# Patient Record
Sex: Female | Born: 1965
Health system: Southern US, Community
[De-identification: ages and names within clinical notes are randomized; demographics above are authoritative.]

## PROBLEM LIST (undated history)

## (undated) DIAGNOSIS — K219 Gastro-esophageal reflux disease without esophagitis: Secondary | ICD-10-CM

## (undated) DIAGNOSIS — I10 Essential (primary) hypertension: Secondary | ICD-10-CM

## (undated) DIAGNOSIS — T7840XA Allergy, unspecified, initial encounter: Secondary | ICD-10-CM

## (undated) DIAGNOSIS — O24419 Gestational diabetes mellitus in pregnancy, unspecified control: Secondary | ICD-10-CM

## (undated) HISTORY — PX: OTHER SURGICAL HISTORY: SHX169

## (undated) HISTORY — DX: Allergy, unspecified, initial encounter: T78.40XA

## (undated) HISTORY — DX: Gestational diabetes mellitus in pregnancy, unspecified control: O24.419

## (undated) HISTORY — DX: Gastro-esophageal reflux disease without esophagitis: K21.9

---
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
## 2004-06-16 ENCOUNTER — Encounter: Payer: Self-pay | Admitting: Family Medicine

## 2004-06-19 ENCOUNTER — Ambulatory Visit: Payer: Self-pay

## 2004-08-01 ENCOUNTER — Ambulatory Visit: Payer: Self-pay

## 2005-02-07 ENCOUNTER — Ambulatory Visit: Payer: Self-pay

## 2005-03-15 ENCOUNTER — Ambulatory Visit: Payer: Self-pay

## 2005-03-29 ENCOUNTER — Ambulatory Visit: Payer: Self-pay

## 2006-07-17 ENCOUNTER — Ambulatory Visit: Payer: Self-pay | Admitting: Internal Medicine

## 2006-07-26 ENCOUNTER — Ambulatory Visit: Payer: Self-pay | Admitting: Family Medicine

## 2007-07-30 LAB — CONVERTED CEMR LAB: Pap Smear: NORMAL

## 2008-08-24 ENCOUNTER — Ambulatory Visit: Payer: Self-pay | Admitting: Family Medicine

## 2008-08-24 DIAGNOSIS — K219 Gastro-esophageal reflux disease without esophagitis: Secondary | ICD-10-CM | POA: Insufficient documentation

## 2008-08-24 DIAGNOSIS — E0590 Thyrotoxicosis, unspecified without thyrotoxic crisis or storm: Secondary | ICD-10-CM | POA: Insufficient documentation

## 2008-08-24 DIAGNOSIS — J309 Allergic rhinitis, unspecified: Secondary | ICD-10-CM | POA: Insufficient documentation

## 2008-08-26 ENCOUNTER — Ambulatory Visit: Payer: Self-pay | Admitting: Family Medicine

## 2008-08-28 LAB — CONVERTED CEMR LAB
ALT: 27 units/L (ref 0–35)
AST: 21 units/L (ref 0–37)
Albumin: 3.7 g/dL (ref 3.5–5.2)
Alkaline Phosphatase: 131 units/L — ABNORMAL HIGH (ref 39–117)
BUN: 17 mg/dL (ref 6–23)
Bilirubin, Direct: 0.1 mg/dL (ref 0.0–0.3)
CO2: 26 meq/L (ref 19–32)
Calcium: 9.2 mg/dL (ref 8.4–10.5)
Chloride: 109 meq/L (ref 96–112)
Cholesterol: 159 mg/dL (ref 0–200)
Creatinine, Ser: 1 mg/dL (ref 0.4–1.2)
GFR calc non Af Amer: 65.14 mL/min (ref >60)
Glucose, Bld: 106 mg/dL — ABNORMAL HIGH (ref 70–99)
HDL: 49.3 mg/dL (ref >39.00)
LDL Cholesterol: 92 mg/dL (ref 0–99)
Potassium: 4.1 meq/L (ref 3.5–5.1)
Sodium: 140 meq/L (ref 135–145)
TSH: 1.05 microintl units/mL (ref 0.35–5.50)
Total Bilirubin: 1 mg/dL (ref 0.3–1.2)
Total CHOL/HDL Ratio: 3
Total Protein: 7.6 g/dL (ref 6.0–8.3)
Triglycerides: 91 mg/dL (ref 0.0–149.0)
VLDL: 18.2 mg/dL (ref 0.0–40.0)

## 2008-09-01 ENCOUNTER — Other Ambulatory Visit: Admission: RE | Admit: 2008-09-01 | Discharge: 2008-09-01 | Payer: Self-pay | Admitting: Family Medicine

## 2008-09-01 ENCOUNTER — Encounter: Payer: Self-pay | Admitting: Family Medicine

## 2008-09-01 ENCOUNTER — Ambulatory Visit: Payer: Self-pay | Admitting: Family Medicine

## 2008-09-07 ENCOUNTER — Encounter (INDEPENDENT_AMBULATORY_CARE_PROVIDER_SITE_OTHER): Payer: Self-pay | Admitting: *Deleted

## 2008-09-15 ENCOUNTER — Encounter: Payer: Self-pay | Admitting: Family Medicine

## 2008-09-15 ENCOUNTER — Ambulatory Visit: Payer: Self-pay | Admitting: Family Medicine

## 2008-09-15 ENCOUNTER — Telehealth: Payer: Self-pay | Admitting: Family Medicine

## 2008-09-15 LAB — HM MAMMOGRAPHY: HM Mammogram: NORMAL

## 2008-09-20 ENCOUNTER — Encounter (INDEPENDENT_AMBULATORY_CARE_PROVIDER_SITE_OTHER): Payer: Self-pay | Admitting: *Deleted

## 2009-01-13 ENCOUNTER — Ambulatory Visit: Payer: Self-pay | Admitting: Family Medicine

## 2009-02-04 ENCOUNTER — Ambulatory Visit: Payer: Self-pay | Admitting: Family Medicine

## 2009-07-13 ENCOUNTER — Ambulatory Visit: Payer: Self-pay | Admitting: Family Medicine

## 2009-07-15 ENCOUNTER — Telehealth: Payer: Self-pay | Admitting: Family Medicine

## 2009-07-19 ENCOUNTER — Telehealth: Payer: Self-pay | Admitting: Family Medicine

## 2009-08-05 ENCOUNTER — Ambulatory Visit: Payer: Self-pay | Admitting: Family Medicine

## 2009-08-26 ENCOUNTER — Ambulatory Visit: Payer: Self-pay | Admitting: Family Medicine

## 2009-08-26 ENCOUNTER — Encounter: Payer: Self-pay | Admitting: Family Medicine

## 2009-09-27 ENCOUNTER — Ambulatory Visit: Payer: Self-pay | Admitting: Family Medicine

## 2009-10-17 ENCOUNTER — Ambulatory Visit: Payer: Self-pay | Admitting: Internal Medicine

## 2010-02-17 ENCOUNTER — Telehealth (INDEPENDENT_AMBULATORY_CARE_PROVIDER_SITE_OTHER): Payer: Self-pay | Admitting: *Deleted

## 2010-02-21 ENCOUNTER — Ambulatory Visit: Payer: Self-pay | Admitting: Family Medicine

## 2010-02-21 LAB — CONVERTED CEMR LAB
ALT: 28 units/L (ref 0–35)
AST: 23 units/L (ref 0–37)
Albumin: 3.7 g/dL (ref 3.5–5.2)
Alkaline Phosphatase: 116 units/L (ref 39–117)
BUN: 13 mg/dL (ref 6–23)
Bilirubin, Direct: 0.2 mg/dL (ref 0.0–0.3)
CO2: 27 meq/L (ref 19–32)
Calcium: 9 mg/dL (ref 8.4–10.5)
Chloride: 105 meq/L (ref 96–112)
Cholesterol: 166 mg/dL (ref 0–200)
Creatinine, Ser: 0.9 mg/dL (ref 0.4–1.2)
GFR calc non Af Amer: 69.45 mL/min (ref >60)
Glucose, Bld: 116 mg/dL — ABNORMAL HIGH (ref 70–99)
HDL: 48.7 mg/dL (ref >39.00)
LDL Cholesterol: 102 mg/dL — ABNORMAL HIGH (ref 0–99)
Potassium: 3.8 meq/L (ref 3.5–5.1)
Sodium: 138 meq/L (ref 135–145)
TSH: 1.06 microintl units/mL (ref 0.35–5.50)
Total Bilirubin: 0.9 mg/dL (ref 0.3–1.2)
Total CHOL/HDL Ratio: 3
Total Protein: 6.9 g/dL (ref 6.0–8.3)
Triglycerides: 78 mg/dL (ref 0.0–149.0)
VLDL: 15.6 mg/dL (ref 0.0–40.0)

## 2010-02-24 ENCOUNTER — Ambulatory Visit: Payer: Self-pay | Admitting: Family Medicine

## 2010-02-24 LAB — HM PAP SMEAR

## 2010-02-24 LAB — CONVERTED CEMR LAB

## 2010-04-12 ENCOUNTER — Telehealth: Payer: Self-pay | Admitting: Family Medicine

## 2010-04-20 ENCOUNTER — Ambulatory Visit: Payer: Self-pay | Admitting: Internal Medicine

## 2010-04-25 ENCOUNTER — Ambulatory Visit
Admission: RE | Admit: 2010-04-25 | Discharge: 2010-04-25 | Payer: Self-pay | Source: Home / Self Care | Attending: Internal Medicine | Admitting: Internal Medicine

## 2010-05-30 NOTE — Assessment & Plan Note (Signed)
Summary: CPX / LFW   Vital Signs:  Patient profile:   45 year old female Height:      63 inches Weight:      202.0 pounds BMI:     35.91 Temp:     98.6 degrees F oral Pulse rate:   80 / minute Pulse rhythm:   regular BP sitting:   130 / 80  (left arm) Cuff size:   large  Vitals Entered By: Benny Lennert CMA Duncan Dull) (February 24, 2010 3:43 PM)  History of Present Illness: Chief complaint cpx  The patient is here for annual wellness exam and preventative care.     Rash on elbow..changed with triamcinolone cream..now pink plaques whit thick white scale. Not itchy.  Family history of psoriasis.   GERD.Marland Kitchenpoorly controlled on omeprazole 40mg  ...1-2 a week.  Feels tightness/irritation in throat.Marland Kitchenafter meals. No abdominal pain.    Prediabetes.[employer]..but hard to go..busy. Interested in nutritionist.   Mild intermittant asthma stable on advair.  Problems Prior to Update: 1)  ? of Psoriasis  (ICD-696.1) 2)  Routine Gynecological Examination  (ICD-V72.31) 3)  Well Woman  (ICD-V70.0) 4)  Other Screening Mammogram  (ICD-V76.12) 5)  Screening For Lipoid Disorders  (ICD-V77.91) 6)  Hyperthyroidism, Subclinical  (ICD-242.90) 7)  Allergic Rhinitis  (ICD-477.9) 8)  Gerd  (ICD-530.81) 9)  Prediabetes  (ICD-790.29) 10)  Asthma, Intermittent, Mild  (ICD-493.90)  Current Medications (verified): 1)  Pantoprazole Sodium 40 Mg Tbec (Pantoprazole Sodium) .[name].. 1 Tab By Mouth Daily 2)  Proair Hfa 108 (90 Base) Mcg/act Aers (Albuterol Sulfate) .... 2 Puffs Every 4 Hours As Needed 3)  Singulair 10 Mg Tabs (Montelukast Sodium) .[name].. 1 Tab By Mouth At Bedtime 4)  Fluticasone Propionate 50 Mcg/act Susp (Fluticasone Propionate) .... 2 Puff in Each Nostril Daily 5)  Zyrtec Allergy 10 Mg Tabs (Cetirizine Hcl) .... Take 1 Tablet By Mouth Once A Day 6)  Multivitamins   Tabs (Multiple Vitamin) .... Take 1 Tablet By Mouth Once A Day 7)  Calcium Carbonate-Vitamin D 600-400 Mg-Unit  Tabs (Calcium  Carbonate-Vitamin D) .... Take 1 Tablet By Mouth Once A Day 8)  Vitamin B-12 500 Mcg  Tabs (Cyanocobalamin) .... Take 1 Tablet By Mouth Once A Day  Allergies (verified): No Known Drug Allergies  Past History:  Past medical, surgical, family and social histories (including risk factors) reviewed, and no changes noted (except as noted below).  Past Medical History: Reviewed history from 08/24/2008 and no changes required. GERD Allergic rhinitis  Past Surgical History: Reviewed history from 08/24/2008 and no changes required. C [phone] and 2000 Appendectomy Tonsillectomy  Family History: Reviewed history from 08/24/2008 and no changes required. father: HTN, arthritis, sleep apnea mother: high chol, osteoporosis brother:sleep apnea no cancer no MI < age 78  Social History: Reviewed history from 08/24/2008 and no changes required. 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  Review of Systems General:  Denies fatigue. CV:  Denies chest pain or discomfort. Resp:  Denies cough and shortness of breath. GI:  Denies abdominal pain, bloody stools, constipation, and diarrhea. GU:  Denies dysuria.  Physical Exam  General:  Well-developed,well-nourished,in no acute distress; alert,appropriate and cooperative throughout examination Eyes:  No corneal or conjunctival inflammation noted. EOMI. Perrla. Funduscopic exam benign, without hemorrhages, exudates or papilledema. Vision grossly normal. Ears:  External ear exam shows no significant  lesions or deformities.  Otoscopic examination reveals clear canals, tympanic membranes are intact bilaterally without bulging, retraction, inflammation or discharge. Hearing is grossly normal bilaterally. Nose:  External nasal examination shows no deformity or  inflammation. Nasal mucosa are pink and moist without lesions or exudates. Mouth:  Oral mucosa and oropharynx without lesions or exudates.  Teeth in good repair. Neck:  no carotid bruit or thyromegaly no cervical or supraclavicular lymphadenopathy  Chest Wall:  No deformities, masses, or tenderness noted. Breasts:  No mass, nodules, thickening, tenderness, bulging, retraction, inflamation, nipple discharge or skin changes noted.   Lungs:  Normal respiratory effort, chest expands symmetrically. Lungs are clear to auscultation, no crackles or wheezes. Heart:  Normal rate and regular rhythm. S1 and S2 normal without gallop, murmur, click, rub or other extra sounds. Abdomen:  Bowel sounds positive,abdomen soft and non-tender without masses, organomegaly or hernias noted. Genitalia:  Pelvic Exam:        External: normal female genitalia without lesions or masses        Vagina: normal without lesions or masses        Cervix: normal without lesions or masses        Adnexa: normal bimanual exam without masses or fullness        Uterus: normal by palpation        Pap smear: performed Pulses:  R and L posterior tibial pulses are full and equal bilaterally  Extremities:  no edema  Skin:  silverish flacky scale of thisck pink plaque on B elbows Psych:  Cognition and judgment appear intact. Alert and cooperative with normal attention span and concentration. No apparent delusions, illusions, hallucinations   Impression & Recommendations:  Problem # 1:  WELL WOMAN (ICD-V70.0) The patient's preventative maintenance and recommended screening tests for an annual wellness exam were reviewed in full today. Brought up to date unless services declined.  Counselled on the importance of diet, exercise, and its role in overall health and mortality. The patient's FH and SH was reviewed, including their home life, tobacco status, and drug and alcohol status.     Problem # 2:  ROUTINE GYNECOLOGICAL EXAMINATION  (ICD-V72.31) PAP pending   Problem # 3:  ? of PSORIASIS (ICD-696.1) Refer to derm for further eval and treat.   Problem # 4:  PREDIABETES (ICD-790.29)  Encouraged exercise, weight loss, healthy eating habits.  Orders: Nutrition Referral (Nutrition)  Labs Reviewed: Creat: 0.9 (02/21/2010)     Problem # 5:  GERD (ICD-530.81) Decrease tomato, citris, peppermint, caffeine., chocolate.  Small meals, lose weight. Start pantoprazole daily. Call for GI referral if reflux not improving in 4-6 weeeks.  Her updated medication list for this problem includes:    Pantoprazole Sodium 40 Mg Tbec (Pantoprazole sodium) .[name]... 1 tab by mouth daily  Problem # 6:  ASTHMA, INTERMITTENT, MILD (ICD-493.90) ON advair Her updated medication list for this problem includes:    Proair Hfa 108 (90 Base) Mcg/act Aers (Albuterol sulfate) .[name]... 2 puffs every 4 hours as needed    Singulair 10 Mg Tabs (Montelukast sodium) .[name]... 1 tab by mouth at bedtime  Complete Medication List: 1)  Pantoprazole Sodium 40 Mg Tbec (Pantoprazole sodium) .[name].. 1 tab by mouth daily 2)  Proair Hfa 108 (90 Base) Mcg/act Aers (Albuterol sulfate) .... 2 puffs every 4 hours as needed 3)  Singulair 10 Mg Tabs (Montelukast sodium) .[name].. 1 tab by mouth at bedtime 4)  Fluticasone Propionate 50 Mcg/act Susp (Fluticasone propionate) .... 2 puff in each nostril  daily 5)  Zyrtec Allergy 10 Mg Tabs (Cetirizine hcl) .... Take 1 tablet by mouth once a day 6)  Multivitamins Tabs (Multiple vitamin) .... Take 1 tablet by mouth once a day 7)  Calcium Carbonate-vitamin D 600-400 Mg-unit Tabs (Calcium carbonate-vitamin d) .... Take 1 tablet by mouth once a day 8)  Vitamin B-12 500 Mcg Tabs (Cyanocobalamin) .... Take 1 tablet by mouth once a day  Other Orders: Admin 1st Vaccine (67619) Flu Vaccine 81yrs + (50932) Pneumococcal Vaccine (67124) Admin of Any Addtl Vaccine (58099) Radiology Referral (Radiology)  Patient Instructions: 1)  Decrease tomato,  citris, peppermint, caffeine., chocolate. 2)   Small meals, lose weight. 3)  Start pantoprazole daily. 4)  Call for GI referral if reflux not improving in 4-6 weeeks.  5)  Referral Appointment Information 6)  Day/Date: 7)  Time: 8)  Place/MD: 9)  Address: 10)  Phone/Fax: 11)  Patient given appointment information. Information/Orders faxed/mailed.  Prescriptions: SINGULAIR 10 MG TABS (MONTELUKAST SODIUM) 1 tab by mouth at bedtime  #90 x 3   Entered and Authorized by:   Kerby Nora MD   Signed by:   Kerby Nora MD on 02/24/2010   Method used:   Electronically to        [facility]* (mail-order)       [facility]       [address]       [place], [place]  [address]       Ph: [phone]       Fax: [phone]   RxID:   [id number] FLUTICASONE PROPIONATE 50 MCG/ACT SUSP (FLUTICASONE PROPIONATE) 2 puff in each nostril daily  #3 x 3   Entered and Authorized by:   Kerby Nora MD   Signed by:   Kerby Nora MD on 02/24/2010   Method used:   Electronically to        [facility]* (mail-order)       [facility]       [address]       [place], [place]  [address]       Ph: [phone]       Fax: [phone]   RxID:   [id number] PANTOPRAZOLE SODIUM 40 MG TBEC (PANTOPRAZOLE SODIUM) 1 tab by mouth daily  #30 x 5   Entered and Authorized by:   Kerby Nora MD   Signed by:   Kerby Nora MD on 02/24/2010   Method used:   [facility]  [address] [phone][facility] (retail)       [address]       [place], [place]  [address]       Ph: [phone]       Fax: [phone]   RxID:   [id number]    Orders Added: 1)  Admin 1st Vaccine [90471] 2)  Flu Vaccine 32yrs + [12751] 3)  Pneumococcal Vaccine [70017] 4)  Admin of Any Addtl Vaccine [90472] 5)  Radiology Referral [Radiology] 6)  Nutrition Referral [Nutrition] 7)  Est. Patient 40-64 years [99396]   Immunizations Administered:  Pneumonia  Vaccine:    Vaccine Type: Pneumovax    Site: right deltoid    Mfr: Merck    Dose: 0.5 ml    Route: [place]    Given by: Benny Lennert CMA (AAMA)    Exp. Date: 08/22/2011    Lot #: [id number]  VIS given: 04/04/09 version given February 24, 2010.   Immunizations Administered:  Pneumonia Vaccine:    Vaccine Type: Pneumovax    Site: right deltoid    Mfr: Merck    Dose: 0.5 ml    Route: [place]    Given by: Benny Lennert CMA (AAMA)    Exp. Date: 08/22/2011    Lot #: [id number]    VIS given: 04/04/09 version given February 24, 2010.  Current Allergies (reviewed today): No known allergies     Flu Vaccine Consent Questions     Do you have a history of severe allergic reactions to this vaccine? no    Any prior history of allergic reactions to egg and/or gelatin? no    Do you have a sensitivity to the preservative Thimersol? no    Do you have a past history of Guillan-Barre Syndrome? no    Do you currently have an acute febrile illness? no    Have you ever had a severe reaction to latex? no    Vaccine information given and explained to patient? yes    Are you currently pregnant? no    Lot Number:[id number]   Exp Date:10/28/2010   Site Given  Left Deltoid IM  .[email or web address]   Past Medical History:    Reviewed history from 08/24/2008 and no changes required:       GERD       Allergic rhinitis  Past Surgical History:    Reviewed history from 08/24/2008 and no changes required:       C [phone] and 2000       Appendectomy       Tonsillectomy   Flu Vaccine Result Date:  02/24/2010 Flu Vaccine Result:  given Flu Vaccine Next Due:  1 yr Last PAP:  NEGATIVE FOR INTRAEPITHELIAL LESIONS OR MALIGNANCY. (09/01/2008 12:00:00 AM) PAP Result Date:  02/24/2010 PAP Result:  DVE , pap q2-3 years PAP Next Due:  1 yr

## 2010-05-30 NOTE — Progress Notes (Signed)
Summary: wants something else for cough  Phone Note Call from Patient Call back at [phone]   Caller: Patient Call For: Dr. Ermalene Searing Summary of Call: Pt was given tesselon for a cough but that is not helping.  The cough is keeping her awake at night and she is asking that [facility] [place]. Initial call taken by: Lowella Petties CMA,  July 19, 2009 1:32 PM  Follow-up for Phone Call        Phone Call Completed, Rx Called In Follow-up by: Benny Lennert CMA Duncan Dull),  July 19, 2009 2:30 PM    New/Updated Medications: GUAIFENESIN-CODEINE 100-10 MG/5ML SYRP (GUAIFENESIN-CODEINE) 1-2 tsp by mouth at bedtime as needed cough Prescriptions: GUAIFENESIN-CODEINE 100-10 MG/5ML SYRP (GUAIFENESIN-CODEINE) 1-2 tsp by mouth at bedtime as needed cough  #8 oz x 0   Entered and Authorized by:   Kerby Nora MD   Signed by:   Kerby Nora MD on 07/19/2009   Method used:   Telephoned to ...       [phone] ([facility]ail[facility]       [address]       [place], [place]  [address]       Ph: [phone]       Fax: [phone]   RxID:   [id number]

## 2010-05-30 NOTE — Progress Notes (Signed)
----   Converted from flag ---- ---- 02/16/2010 11:14 PM, Kerby Nora MD wrote: Dx 242.90 TSH Dx CMET, lipids Dx v77.91  ---- 02/16/2010 9:53 AM, Liane Comber CMA (AAMA) wrote: Lab orders please! Good Morning! This pt is scheduled for cpx labs Tuesday, which labs to draw and dx codes to use? Thanks Tasha ------------------------------

## 2010-05-30 NOTE — Progress Notes (Signed)
Summary: needs order faxed for mammogram  Phone Note From Other Clinic   Caller: [facility] Call For: dr Ermalene Searing Summary of Call: Pt showed up for screening mammogram this morning without an order, they need order faxed to [phone]. Initial call taken by: Lowella Petties,  Sep 15, 2008 11:55 AM  Follow-up for Phone Call        ORDER FAXED TO [facility]  Follow-up by: Carlton Adam,  Sep 15, 2008 3:45 PM

## 2010-05-30 NOTE — Assessment & Plan Note (Signed)
Summary: sore throat/congestion/dlo   Vital Signs:  Patient profile:   45 year old female Height:      63 inches Weight:      196.25 pounds BMI:     34.89 Temp:     98.4 degrees F oral Pulse rate:   84 / minute Pulse rhythm:   regular BP sitting:   148 / 90  (left arm) Cuff size:   large  Vitals Entered By: Delilah Shan CMA Duncan Dull) (July 13, 2009 12:45 PM) CC: Congestion in chest, ST   History of Present Illness: 45 yo here with several days of sore throat, productive cough, nasal congestion. States that she has had pneumonia 5 times in past, has been receving pneumovax reguarly. No shortness of breath, fevers, chest pain. No n/v/d. Not taking anything OTC.  Current Medications (verified): 1)  Omeprazole 40 Mg Cpdr (Omeprazole) .... Take 1 Tablet By Mouth Once A Day 2)  Zyrtec Allergy 10 Mg Tabs (Cetirizine Hcl) .... Take 1 Tablet By Mouth Once A Day 3)  Proair Hfa 108 (90 Base) Mcg/act Aers (Albuterol Sulfate) .... 2 Puffs Every 4 Hours As Needed 4)  Singulair 10 Mg Tabs (Montelukast Sodium) .[name].. 1 Tab By Mouth At Bedtime 5)  Multivitamins   Tabs (Multiple Vitamin) .... Take 1 Tablet By Mouth Once A Day 6)  Calcium Carbonate-Vitamin D 600-400 Mg-Unit  Tabs (Calcium Carbonate-Vitamin D) .... Take 1 Tablet By Mouth Once A Day 7)  Vitamin B-12 500 Mcg  Tabs (Cyanocobalamin) .... Take 1 Tablet By Mouth Once A Day 8)  Azithromycin 250 Mg  Tabs (Azithromycin) .... 2 By  Mouth Today and Then 1 Daily For 4 Days  Allergies (verified): No Known Drug Allergies  Review of Systems      See HPI General:  Denies chills and fever. CV:  Denies chest pain or discomfort. Resp:  Complains of cough and sputum productive; denies shortness of breath and wheezing.  Physical Exam  General:  overweight female in NAD Hypertensive compared to last OV. Afebrile Ears:  External ear exam shows no significant lesions or deformities.  Otoscopic examination reveals clear canals, tympanic membranes  are intact bilaterally without bulging, retraction, inflammation or discharge. Hearing is grossly normal bilaterally. Nose:  mucosal erythema.   Mouth:  MMM Lungs:  Normal respiratory effort, chest expands symmetrically. Lungs are clear to auscultation, no crackles or wheezes. Heart:  Normal rate and regular rhythm. S1 and S2 normal without gallop, murmur, click, rub or other extra sounds. Extremities:  no edema Psych:  withdrawn, poor eye contact, labile affect, and tearful.     Impression & Recommendations:  Problem # 1:  URI (ICD-465.9) Assessment New Early in course but given past medical history, will treat for bacterial bronchitis/sinusitis with Zpack. Close follow up for patient. Red flag signs/symptoms given. Her updated medication list for this problem includes:    Zyrtec Allergy 10 Mg Tabs (Cetirizine hcl) .[name]... Take 1 tablet by mouth once a day  Complete Medication List: 1)  Omeprazole 40 Mg Cpdr (Omeprazole) .... Take 1 tablet by mouth once a day 2)  Zyrtec Allergy 10 Mg Tabs (Cetirizine hcl) .... Take 1 tablet by mouth once a day 3)  Proair Hfa 108 (90 Base) Mcg/act Aers (Albuterol sulfate) .... 2 puffs every 4 hours as needed 4)  Singulair 10 Mg Tabs (Montelukast sodium) .[name].. 1 tab by mouth at bedtime 5)  Multivitamins Tabs (Multiple vitamin) .... Take 1 tablet by mouth once a day 6)  Calcium  Carbonate-vitamin D 600-400 Mg-unit Tabs (Calcium carbonate-vitamin d) .... Take 1 tablet by mouth once a day 7)  Vitamin B-12 500 Mcg Tabs (Cyanocobalamin) .... Take 1 tablet by mouth once a day 8)  Azithromycin 250 Mg Tabs (Azithromycin) .... 2 by  mouth today and then 1 daily for 4 days Prescriptions: AZITHROMYCIN 250 MG  TABS (AZITHROMYCIN) 2 by  mouth today and then 1 daily for 4 days  #6 x 0   Entered and Authorized by:   Ruthe Mannan MD   Signed by:   Ruthe Mannan MD on 07/13/2009   Method used:   [facility]        [facility] [phone][facility] (retail[facility]       [address]        [place], [place]  [address]       Ph: [phone]       Fax: [phone]   RxID:   [id number]   Current Allergies (reviewed today): No known allergies

## 2010-05-30 NOTE — Letter (Signed)
Summary: Results Follow up Letter  [place] [facility]  [address] [place], [place] [address]   Phone: [phone]  Fax: [phone]    09/07/2008 MRN: [id number]    [facility] [address] [place], [place]  [address]    Dear Donna Hensley,  The following are the results of your recent test(s):  Test         Result    Pap Smear:        Normal __X___  Not Normal _____ Comments:   Yearly follow up is recommended. ______________________________________________________ Cholesterol: LDL(Bad cholesterol):         Your goal is less than:         HDL (Good cholesterol):       Your goal is more than: Comments:  ______________________________________________________ Mammogram:        Normal _____  Not Normal _____ Comments:  ___________________________________________________________________ Hemoccult:        Normal _____  Not normal _______ Comments:    _____________________________________________________________________ Other Tests:   GC/Chlamydia is Negative.     We routinely do not discuss normal results over the telephone.  If you desire a copy of the results, or you have any questions about this information we can discuss them at your next office visit.   Sincerely,     Excell Seltzer, M.D.  AEB:lsf

## 2010-05-30 NOTE — Assessment & Plan Note (Signed)
Summary: cough/alc   Vital Signs:  Patient profile:   45 year old female Height:      63 inches Weight:      198.2 pounds BMI:     35.24 O2 Sat:      98 % on Room air Temp:     98.3 degrees F oral Pulse rhythm:   regular Resp:     20 per minute BP sitting:   120 / 78  (left arm) Cuff size:   large  Vitals Entered By: Benny Lennert CMA Duncan Dull) (August 26, 2009 8:50 AM)  O2 Flow:  Room air  History of Present Illness: Chief complaint cough  Peak flow 180 best!  Initial illness in beginning of MArch..took Z-pack no improvement. Treated in early April foir postinflammatory bronchospasm with inhalers..cough continuing.  Some chest heaviness.  Continues to have dry hacking cough. No fever..some fatigue. not sleeping well at night due to cough. Some mild nassal congestion from allergies. No itchy eyes, no sneezing. Taking Zyrtec, singulair and flonase.   No heartburn on omeprazole 40 mg.    Problems Prior to Update: 1)  Cough  (ICD-786.2) 2)  Uri  (ICD-465.9) 3)  Ulnar Neuropathy  (ICD-354.2) 4)  Back Pain, Acute  (ICD-724.5) 5)  Anxiety Depression  (ICD-300.4) 6)  Routine Gynecological Examination  (ICD-V72.31) 7)  Well Woman  (ICD-V70.0) 8)  Other Screening Mammogram  (ICD-V76.12) 9)  Screening For Lipoid Disorders  (ICD-V77.91) 10)  Skin Rash  (ICD-782.1) 11)  Hyperthyroidism, Subclinical  (ICD-242.90) 12)  Allergic Rhinitis  (ICD-477.9) 13)  Gerd  (ICD-530.81) 14)  Diabetes Mellitus, Gestational  (ICD-648.80) 15)  Asthma, Intermittent, Mild  (ICD-493.90)  Current Medications (verified): 1)  Omeprazole 40 Mg Cpdr (Omeprazole) .... Take 1 Tablet By Mouth Once A Day 2)  Zyrtec Allergy 10 Mg Tabs (Cetirizine Hcl) .... Take 1 Tablet By Mouth Once A Day 3)  Proair Hfa 108 (90 Base) Mcg/act Aers (Albuterol Sulfate) .... 2 Puffs Every 4 Hours As Needed 4)  Singulair 10 Mg Tabs (Montelukast Sodium) .[name].. 1 Tab By Mouth At Bedtime 5)  Multivitamins   Tabs (Multiple  Vitamin) .... Take 1 Tablet By Mouth Once A Day 6)  Calcium Carbonate-Vitamin D 600-400 Mg-Unit  Tabs (Calcium Carbonate-Vitamin D) .... Take 1 Tablet By Mouth Once A Day 7)  Vitamin B-12 500 Mcg  Tabs (Cyanocobalamin) .... Take 1 Tablet By Mouth Once A Day 8)  Guaifenesin-Codeine 100-10 Mg/38ml Syrp (Guaifenesin-Codeine) .[name].. 1-2 Tsp By Mouth At Bedtime As Needed Cough 9)  Fluticasone Propionate 50 Mcg/act Susp (Fluticasone Propionate) .... 2 Puff in Each Nostril Daily 10)  Prednisone 10 Mg Tabs (Prednisone) .... 3 Tabs By Mouth Daily X 3 Days, Then 2 Tabs By Mouth Daily X 2 Days Then 1 Tab By Mouth Daily X 2 Days  Allergies (verified): No Known Drug Allergies  Past History:  Past medical, surgical, family and social histories (including risk factors) reviewed, and no changes noted (except as noted below).  Past Medical History: Reviewed history from 08/24/2008 and no changes required. GERD Allergic rhinitis  Past Surgical History: Reviewed history from 08/24/2008 and no changes required. C [phone] and 2000 Appendectomy Tonsillectomy  Family History: Reviewed history from 08/24/2008 and no changes required. father: HTN, arthritis, sleep apnea mother: high chol, osteoporosis brother:sleep apnea no cancer no MI < age 25  Social History: Reviewed history from 08/24/2008 and no changes required. 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  Review of Systems General:  Denies fatigue. CV:  Denies chest pain or discomfort. Resp:  Denies coughing up blood and excessive snoring. GI:  Denies abdominal pain. GU:  Denies dysuria.  Physical Exam  General:  Well-developed,well-nourished,in no acute distress; alert,appropriate and cooperative throughout examination Nose:  External nasal examination  shows no deformity or inflammation. Nasal mucosa are pink and moist without lesions or exudates. Mouth:  Oral mucosa and oropharynx without lesions or exudates.  Teeth in good repair. Neck:  no carotid bruit or thyromegaly no cervical or supraclavicular lymphadenopathy  Lungs:  Normal respiratory effort, chest expands symmetrically. Lungs are clear to auscultation, no crackles or wheezes. Heart:  Normal rate and regular rhythm. S1 and S2 normal without gallop, murmur, click, rub or other extra sounds. Pulses:  R and L posterior tibial pulses are full and equal bilaterally  Extremities:  no edema Skin:  subcutaneous cyst central back drained with palpation, no redness, was mildly tender to patient.    Impression & Recommendations:  Problem # 1:  COUGH (ICD-786.2) CXR clear..symptoms consistent with asthma exacerbaton. Infection resolved, but ongoing regular cough, wheeze improved with albuterol temporarily. Treat with Advair  and albuterol as needed. Return in 1 month for lung function tests.  Orders: CXR- 2view (CXR)  Complete Medication List: 1)  Omeprazole 40 Mg Cpdr (Omeprazole) .... Take 1 tablet by mouth once a day 2)  Zyrtec Allergy 10 Mg Tabs (Cetirizine hcl) .... Take 1 tablet by mouth once a day 3)  Proair Hfa 108 (90 Base) Mcg/act Aers (Albuterol sulfate) .... 2 puffs every 4 hours as needed 4)  Singulair 10 Mg Tabs (Montelukast sodium) .[name].. 1 tab by mouth at bedtime 5)  Multivitamins Tabs (Multiple vitamin) .... Take 1 tablet by mouth once a day 6)  Calcium Carbonate-vitamin D 600-400 Mg-unit Tabs (Calcium carbonate-vitamin d) .... Take 1 tablet by mouth once a day 7)  Vitamin B-12 500 Mcg Tabs (Cyanocobalamin) .... Take 1 tablet by mouth once a day 8)  Guaifenesin-codeine 100-10 Mg/22ml Syrp (Guaifenesin-codeine) .[name].. 1-2 tsp by mouth at bedtime as needed cough 9)  Fluticasone Propionate 50 Mcg/act Susp (Fluticasone propionate) .... 2 puff in each nostril daily 10)  Prednisone  10 Mg Tabs (Prednisone) .... 3 tabs by mouth daily x 3 days, then 2 tabs by mouth daily x 2 days then 1 tab by mouth daily x 2 days 11)  Advair Diskus 250-50 Mcg/dose Aepb (Fluticasone-salmeterol) .[name].. 1 inh two times a day  Patient Instructions: 1)  USe albuterol as need for rescue.  2)  Start advair every day, two times a day  3)   Follow up in 1 month for spirometry. Prescriptions: ADVAIR DISKUS 250-50 MCG/DOSE AEPB (FLUTICASONE-SALMETEROL) 1 inh two times a day  #1 x 2   Entered and Authorized by:   Kerby Nora MD   Signed by:   Kerby Nora MD on 08/26/2009   Method used:   [facility] to        [facility] [phone]* (retail)       [address]       [place], [place]  [address]       Ph: [phone]       Fax: [phone]   RxID:   [id number]   Current Allergies (reviewed today): No known allergies

## 2010-05-30 NOTE — Assessment & Plan Note (Signed)
Summary: NEW PATIENT/BIR   Vital Signs:  Patient profile:   45 year old female Height:      63 inches Weight:      200.50 pounds BMI:     35.65 Temp:     98.1 degrees F oral Pulse rate:   84 / minute Pulse rhythm:   regular BP sitting:   132 / 90  (left arm) Cuff size:   regular  Vitals Entered By: Delilah Shan (August 24, 2008 10:41 AM)  History of Present Illness: Chief Complaint:  New Patient to Establish.    Patient was on Singulair but has been out for a month.  Allergic rhinitis, well controlled on singulair and Zyrtec.  Needs refill of singulair.   Elbows dry B, nonpuritic. Applys lotion.  Ear always feel wet..no ear pain x 1 year. More senitive to it since had swimmer's ear last year. Current sinus pain   Preventive Screening-Counseling & Management     Smoking Status: quit     Does Patient Exercise: yes      Drug Use:  no.    Allergies (verified): No Known Drug Allergies  Past History:  Past Medical History:    GERD    Allergic rhinitis  Past Surgical History:    C [phone] and 2000    Appendectomy    Tonsillectomy  Family History:    father: HTN, arthritis, sleep apnea    mother: high chol, osteoporosis    brother:sleep apnea    no cancer    no MI < age 5  Social History:    Occupation: Corporate treasurer rep    Married    2 children: healthy    Former Smoker, remote social for 2 years    Alcohol use-yes, 1-2 drinks per month    Drug use-no    Regular exercise-yes, walk    Diet: fast food, trying to improve diet.    Occupation:  employed    Smoking Status:  quit    Drug Use:  no    Does Patient Exercise:  yes  Review of Systems General:  Denies fatigue. CV:  Denies chest pain or discomfort. Resp:  Denies shortness of breath. GI:  Denies abdominal pain, bloody stools, constipation, and diarrhea. GU:  Denies abnormal vaginal bleeding and dysuria. Derm:  Complains of rash. Psych:  Denies anxiety and depression. Endo:  Complains  of cold intolerance and weight change; denies heat intolerance.  Physical Exam  General:  overweight female in NAD Eyes:  No corneal or conjunctival inflammation noted. EOMI. Perrla. Funduscopic exam benign, without hemorrhages, exudates or papilledema. Vision grossly normal. Ears:  External ear exam shows no significant lesions or deformities.  Otoscopic examination reveals clear canals, tympanic membranes are intact bilaterally without bulging, retraction, inflammation or discharge. Hearing is grossly normal bilaterally. Nose:  External nasal examination shows no deformity or inflammation. Nasal mucosa are pink and moist without lesions or exudates. Mouth:  Oral mucosa and oropharynx without lesions or exudates.  Teeth in good repair. Neck:  no carotid bruit or thyromegaly no cervical or supraclavicular lymphadenopathy  Lungs:  Normal respiratory effort, chest expands symmetrically. Lungs are clear to auscultation, no crackles or wheezes. Heart:  Normal rate and regular rhythm. S1 and S2 normal without gallop, murmur, click, rub or other extra sounds. Abdomen:  Bowel sounds positive,abdomen soft and non-tender without masses, organomegaly or hernias noted. Msk:  No deformity or scoliosis noted of thoracic or lumbar spine.   Pulses:  R and L posterior  tibial pulses are full and equal bilaterally  Extremities:  No clubbing, cyanosis, edema, or deformity noted with normal full range of motion of all joints.   Neurologic:  No cranial nerve deficits noted. Station and gait are normal. Plantar reflexes are down-going bilaterally. DTRs are symmetrical throughout. Sensory, motor and coordinative functions appear intact. Skin:  B elbow with well circmscribed white grey plaue, dry skin, 2 areas with raised warty like appearance   Impression & Recommendations:  Problem # 1:  SKIN RASH (ICD-782.1) Callus/dermatiits, vs psoriasis vs verrucus. Will treat with topical steroid, if not improvement consider  referral to Derm.  Her updated medication list for this problem includes:    Triamcinolone Acetonide 0.5 % Crea (Triamcinolone acetonide) .[name]... Aaa two times a day x 2 week  Problem # 2:  HYPERTHYROIDISM, SUBCLINICAL (ICD-242.90) Due for reeval.   Problem # 3:  ALLERGIC RHINITIS (ICD-477.9) Reiflled singulair.  Her updated medication list for this problem includes:    Zyrtec Allergy 10 Mg Tabs (Cetirizine hcl) .[name]... Take 1 tablet by mouth once a day    Flonase 50 Mcg/act Susp (Fluticasone propionate) .[name]... 2 sprays each nostril once daily  Problem # 4:  Preventive Health Care (ICD-V70.0)  Due for tetanus, mammogram, pap, breast exam, chol and Dm screen. Schedule.   Complete Medication List: 1)  Omeprazole 20 Mg Tbec (Omeprazole) .... Take 1 tablet by mouth once a day 2)  Zyrtec Allergy 10 Mg Tabs (Cetirizine hcl) .... Take 1 tablet by mouth once a day 3)  Flonase 50 Mcg/act Susp (Fluticasone propionate) .... 2 sprays each nostril once daily 4)  Advair Diskus 250-50 Mcg/dose Misc (Fluticasone-salmeterol) .... As needed 5)  Proair Hfa 108 (90 Base) Mcg/act Aers (Albuterol sulfate) .... 2 puffs every 4 hours as needed 6)  Singulair 10 Mg Tabs (Montelukast sodium) .[name].. 1 tab by mouth at bedtime 7)  Triamcinolone Acetonide 0.5 % Crea (Triamcinolone acetonide) .... Aaa two times a day x 2 week  Patient Instructions: 1)  Fasting lipids, CMET, TSH Dx v77.91, 292.40 2)  schedule CPE/pap.  Prescriptions: TRIAMCINOLONE ACETONIDE 0.5 % CREA (TRIAMCINOLONE ACETONIDE) AAA two times a day x 2 week  #15 gm x 0   Entered and Authorized by:   Kerby Nora MD   Signed by:   Kerby Nora MD on 08/24/2008   Method used:   [facility]        [facility]  [address] [phone]* (retail)       [address]       [place], [place]  [address]       Ph: [phone]       Fax: [phone]   RxID:   [id number] SINGULAIR 10 MG TABS (MONTELUKAST SODIUM) 1 tab by mouth at bedtime  #90 x 3   Entered  and Authorized by:   Kerby Nora MD   Signed by:   Kerby Nora MD on 08/24/2008   Method used:   Print then Give to Patient   RxID:   [id number]       Current Allergies (reviewed today): No known allergies  PAP Result Date:  07/30/2007 PAP Result:  normal PAP Next Due:  1 yr Mammogram Result Date:  03/01/2007 Mammogram Result:  normal Mammogram Next Due:  1 yr

## 2010-05-30 NOTE — Assessment & Plan Note (Signed)
Summary: TROUBLE SLEEPING/CLE   Vital Signs:  Patient profile:   45 year old female Height:      63 inches Weight:      192.0 pounds BMI:     34.13 Temp:     98.1 degrees F oral Pulse rate:   84 / minute Pulse rhythm:   regular BP sitting:   140 / 90  (left arm) Cuff size:   large  Vitals Entered By: Benny Lennert CMA (AAMA) (January 13, 2009 11:45 AM)  History of Present Illness: Chief complaint trouble sleeping   Sudden death of father 6 weeks ago..totally unexpected. feels tense overall. Some trouble falling asleep and staying asleep. 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  Does not feel depressed, but still having trouble dealing with fahter's death. Never taken antidepressant.   B  upper arm numb, left greater than right, x 1 week. Has been doing a lot of moving.  Low to mid back pain. No weakness in arms and legs.    Problems Prior to Update: 1)  Routine Gynecological Examination  (ICD-V72.31) 2)  Well Woman  (ICD-V70.0) 3)  Other Screening Mammogram  (ICD-V76.12) 4)  Screening For Lipoid Disorders  (ICD-V77.91) 5)  Skin Rash  (ICD-782.1) 6)  Hyperthyroidism, Subclinical  (ICD-242.90) 7)  Allergic Rhinitis  (ICD-477.9) 8)  Gerd  (ICD-530.81) 9)  Diabetes Mellitus, Gestational  (ICD-648.80) 10)  Asthma, Intermittent, Mild  (ICD-493.90)  Current Medications (verified): 1)  Omeprazole 40 Mg Cpdr (Omeprazole) .... Take 1 Tablet By Mouth Once A Day 2)  Zyrtec Allergy 10 Mg Tabs (Cetirizine Hcl) .... Take 1 Tablet By Mouth Once A Day 3)  Flonase 50 Mcg/act Susp (Fluticasone Propionate) .... 2 Sprays Each Nostril Once Daily 4)  Advair Diskus 250-50 Mcg/dose Misc (Fluticasone-Salmeterol) .... As Needed 5)  Proair Hfa 108 (90 Base) Mcg/act Aers (Albuterol Sulfate) .... 2 Puffs Every 4 Hours As Needed 6)  Singulair 10 Mg Tabs (Montelukast Sodium) .[name].. 1 Tab By Mouth At Bedtime 7)  Triamcinolone Acetonide 0.5 % Crea (Triamcinolone Acetonide) .... Aaa Two Times A Day X 2  Week 8)  Trazodone Hcl 50 Mg Tabs (Trazodone Hcl) .[name].. 1 Tab By Mouth At Bedtime As Needed Insomnia  Allergies (verified): No Known Drug Allergies  Past History:  Past medical, surgical, family and social histories (including risk factors) reviewed, and no changes noted (except as noted below).  Past Medical History: Reviewed history from 08/24/2008 and no changes required. GERD Allergic rhinitis  Past Surgical History: Reviewed history from 08/24/2008 and no changes required. C [phone] and 2000 Appendectomy Tonsillectomy  Family History: Reviewed history from 08/24/2008 and no changes required. father: HTN, arthritis, sleep apnea mother: high chol, osteoporosis brother:sleep apnea no cancer no MI < age 30  Social History: Reviewed history from 08/24/2008 and no changes required. 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  Review of Systems General:  Complains of fatigue; denies fever. CV:  Denies chest pain or discomfort. Resp:  Denies shortness of breath. GI:  Denies abdominal pain, bloody stools, constipation, and diarrhea. GU:  Denies dysuria.  Physical Exam  General:  overweight female in NAD Mouth:  MMM Neck:  no carotid bruit or thyromegaly no cervical or supraclavicular lymphadenopathy  Lungs:  Normal respiratory effort, chest expands symmetrically. Lungs are clear to auscultation, no crackles or wheezes. Heart:  Normal rate and  regular rhythm. S1 and S2 normal without gallop, murmur, click, rub or other extra sounds. Abdomen:  Bowel sounds positive,abdomen soft and non-tender without masses, organomegaly or hernias noted. Msk:  diffuse lumbar ttp, neg slr, neg faber's, neg spuriling, full ROM of neck and back. Pulses:  R and L posterior tibial pulses are full and equal  bilaterally  Extremities:  No clubbing, cyanosis, edema, or deformity noted with normal full range of motion of all joints.   Neurologic:  No cranial nerve deficits noted. Station and gait are normal. DTRs are symmetrical throughout. Sensory, motor and coordinative functions appear intact. Psych:  withdrawn, poor eye contact, labile affect, and tearful.     Impression & Recommendations:  Problem # 1:  ANXIETY DEPRESSION (ICD-300.4) Insomnia and parasthesia symptoms most likely due to depression/anxiety since recent death of parent.  Will treat with trazodone for sleep. Recommend close follow up...if mood not improving consider addtion of SSRI.   Problem # 2:  BACK PAIN, ACUTE (ICD-724.5) No obvious acute injury.Marland Kitchenintermittant numbness in upper ext.  does not correspond with area oif low back pain . Treat with heat , NSAIDS and stretching. if not improving or continues parasthesia ...will reeval at follow up in 2 weeks.   Complete Medication List: 1)  Omeprazole 40 Mg Cpdr (Omeprazole) .... Take 1 tablet by mouth once a day 2)  Zyrtec Allergy 10 Mg Tabs (Cetirizine hcl) .... Take 1 tablet by mouth once a day 3)  Flonase 50 Mcg/act Susp (Fluticasone propionate) .... 2 sprays each nostril once daily 4)  Advair Diskus 250-50 Mcg/dose Misc (Fluticasone-salmeterol) .... As needed 5)  Proair Hfa 108 (90 Base) Mcg/act Aers (Albuterol sulfate) .... 2 puffs every 4 hours as needed 6)  Singulair 10 Mg Tabs (Montelukast sodium) .[name].. 1 tab by mouth at bedtime 7)  Triamcinolone Acetonide 0.5 % Crea (Triamcinolone acetonide) .... Aaa two times a day x 2 week 8)  Trazodone Hcl 50 Mg Tabs (Trazodone hcl) .[name].. 1 tab by mouth at bedtime as needed insomnia  Patient Instructions: 1)  Please schedule a follow-up appointment in 2 weeks 30 min OV. 2)  Heat , upper body stretches, Ibuprofen 800 mg every 8 hours as needed pain.  Prescriptions: TRAZODONE HCL 50 MG TABS (TRAZODONE HCL) 1 tab by mouth at bedtime as  needed insomnia  #30 x 0   Entered and Authorized by:   Kerby Nora MD   Signed by:   Kerby Nora MD on 01/13/2009   Method used:   [facility] to        [facility] [phone]* (retail)       [address]       [place], [place]  [address]       Ph: [phone]       Fax: [phone]   RxID:   [id number]   Current Allergies (reviewed today): No known allergies

## 2010-05-30 NOTE — Assessment & Plan Note (Signed)
Summary: 30 MIN APPT 2 WEEK FOLLOW UP/[facility]   Vital Signs:  Patient profile:   45 year old female Height:      63 inches Weight:      191.0 pounds BMI:     33.96 Temp:     98.1 degrees F oral Pulse rate:   84 / minute Pulse rhythm:   regular BP sitting:   120 / 70  (left arm) Cuff size:   large  Vitals Entered By: Benny Lennert CMA Duncan Dull) (February 04, 2009 3:47 PM)  History of Present Illness: Chief complaint 30 minute follow up  Depression: improved some...less stress. She feels she is doing okay Insomnia, moderate improvement:  trazodone. Full dose made her have dry mouth...sleeping 6 hours, improved on its own.  No Si, no Hi.  No panic attacks. Tearful when discussing father's death. Seeing family counselor.  Occ numbness when lying in certain positions. Numbness on left posterior arm, ulnar side, radiates from below shoulder.  No specific head movements. No weakness.  no current head, neck pain. No chest pain. no SOB.   Has increased B12.   Problems Prior to Update: 1)  Back Pain, Acute  (ICD-724.5) 2)  Anxiety Depression  (ICD-300.4) 3)  Routine Gynecological Examination  (ICD-V72.31) 4)  Well Woman  (ICD-V70.0) 5)  Other Screening Mammogram  (ICD-V76.12) 6)  Screening For Lipoid Disorders  (ICD-V77.91) 7)  Skin Rash  (ICD-782.1) 8)  Hyperthyroidism, Subclinical  (ICD-242.90) 9)  Allergic Rhinitis  (ICD-477.9) 10)  Gerd  (ICD-530.81) 11)  Diabetes Mellitus, Gestational  (ICD-648.80) 12)  Asthma, Intermittent, Mild  (ICD-493.90)  Current Medications (verified): 1)  Omeprazole 40 Mg Cpdr (Omeprazole) .... Take 1 Tablet By Mouth Once A Day 2)  Zyrtec Allergy 10 Mg Tabs (Cetirizine Hcl) .... Take 1 Tablet By Mouth Once A Day 3)  Flonase 50 Mcg/act Susp (Fluticasone Propionate) .... 2 Sprays Each Nostril Once Daily 4)  Advair Diskus 250-50 Mcg/dose Misc (Fluticasone-Salmeterol) .... As Needed 5)  Proair Hfa 108 (90 Base) Mcg/act Aers (Albuterol Sulfate) .... 2  Puffs Every 4 Hours As Needed 6)  Singulair 10 Mg Tabs (Montelukast Sodium) .[name].. 1 Tab By Mouth At Bedtime  Allergies (verified): No Known Drug Allergies  Past History:  Past medical, surgical, family and social histories (including risk factors) reviewed, and no changes noted (except as noted below).  Past Medical History: Reviewed history from 08/24/2008 and no changes required. GERD Allergic rhinitis  Past Surgical History: Reviewed history from 08/24/2008 and no changes required. C [phone] and 2000 Appendectomy Tonsillectomy  Family History: Reviewed history from 08/24/2008 and no changes required. father: HTN, arthritis, sleep apnea mother: high chol, osteoporosis brother:sleep apnea no cancer no MI < age 63  Social History: Reviewed history from 08/24/2008 and no changes required. 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  Physical Exam  General:  overweight female in NAD Mouth:  MMM Neck:  no carotid bruit or thyromegaly no cervical or supraclavicular lymphadenopathy  Lungs:  Normal respiratory effort, chest expands symmetrically. Lungs are clear to auscultation, no crackles or wheezes. Heart:  Normal rate and regular rhythm. S1 and S2 normal without gallop, murmur, click, rub or other extra sounds. Msk:  no  lumbar ttp, neg slr, neg faber's, neg spuriling, full ROM of neck and back. Triggered ulnar distribution sympotome in left hand when  ulnar nervecompressed at elbow. neg Tinel and phalen's Pulses:  R and L posterior tibial pulses are full and equal bilaterally  Extremities:  no edema Neurologic:  No cranial nerve deficits noted. Station and gait are normal. DTRs are symmetrical throughout. Sensory, motor and coordinative functions appear intact.   Impression &  Recommendations:  Problem # 1:  ANXIETY DEPRESSION (ICD-300.4) Improved with trazodone for sleep and with relaxation, stress reducation. Call if not continuing to improve.   Problem # 2:  BACK PAIN, ACUTE (ICD-724.5) Improved with NSAIds and avoidance of heavy lifting. 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   Problem # 3:  ULNAR NEUROPATHY (ICD-354.2) Info given on rehab exercise and avoidance of triggering postitions. . Minimal symptoms at his point. No suggestion of cervical root compression.  Will also given her higher dose of B12.  Call if not improved.   Complete Medication List: 1)  Omeprazole 40 Mg Cpdr (Omeprazole) .... Take 1 tablet by mouth once a day 2)  Zyrtec Allergy 10 Mg Tabs (Cetirizine hcl) .... Take 1 tablet by mouth once a day 3)  Flonase 50 Mcg/act Susp (Fluticasone propionate) .... 2 sprays each nostril once daily 4)  Advair Diskus 250-50 Mcg/dose Misc (Fluticasone-salmeterol) .... As needed 5)  Proair Hfa 108 (90 Base) Mcg/act Aers (Albuterol sulfate) .... 2 puffs every 4 hours as needed 6)  Singulair 10 Mg Tabs (Montelukast sodium) .[name].. 1 tab by mouth at bedtime  Patient Instructions: 1)  Call if mood not continung to improve. 2)  Call if ulnar neuropathy not improving with rehab exercsies and avpidance of repetitive compression.   Current Allergies (reviewed today): No known allergies

## 2010-05-30 NOTE — Assessment & Plan Note (Signed)
Summary: COUGH/CLE   Vital Signs:  Patient profile:   45 year old female Height:      63 inches Weight:      197.4 pounds BMI:     35.09 Temp:     98.2 degrees F oral Pulse rate:   84 / minute Pulse rhythm:   regular BP sitting:   120 / 70  (left arm) Cuff size:   large  Vitals Entered By: Benny Lennert CMA Duncan Dull) (August 05, 2009 8:42 AM)  History of Present Illness: Chief complaint cough  Seen 1 month ago for bronchitis.Marland Kitchentreated with Z-pack..symptoms resolved except cough never resolved.  Dry cough, no congestion, no fever. No SOB. occ chest heaviness, no wheeze. using albuterol every few days..now out of that and nasal steroid.   Has had history of pneumonia.   Problems Prior to Update: 1)  Uri  (ICD-465.9) 2)  Ulnar Neuropathy  (ICD-354.2) 3)  Back Pain, Acute  (ICD-724.5) 4)  Anxiety Depression  (ICD-300.4) 5)  Routine Gynecological Examination  (ICD-V72.31) 6)  Well Woman  (ICD-V70.0) 7)  Other Screening Mammogram  (ICD-V76.12) 8)  Screening For Lipoid Disorders  (ICD-V77.91) 9)  Skin Rash  (ICD-782.1) 10)  Hyperthyroidism, Subclinical  (ICD-242.90) 11)  Allergic Rhinitis  (ICD-477.9) 12)  Gerd  (ICD-530.81) 13)  Diabetes Mellitus, Gestational  (ICD-648.80) 14)  Asthma, Intermittent, Mild  (ICD-493.90)  Current Medications (verified): 1)  Omeprazole 40 Mg Cpdr (Omeprazole) .... Take 1 Tablet By Mouth Once A Day 2)  Zyrtec Allergy 10 Mg Tabs (Cetirizine Hcl) .... Take 1 Tablet By Mouth Once A Day 3)  Proair Hfa 108 (90 Base) Mcg/act Aers (Albuterol Sulfate) .... 2 Puffs Every 4 Hours As Needed 4)  Singulair 10 Mg Tabs (Montelukast Sodium) .[name].. 1 Tab By Mouth At Bedtime 5)  Multivitamins   Tabs (Multiple Vitamin) .... Take 1 Tablet By Mouth Once A Day 6)  Calcium Carbonate-Vitamin D 600-400 Mg-Unit  Tabs (Calcium Carbonate-Vitamin D) .... Take 1 Tablet By Mouth Once A Day 7)  Vitamin B-12 500 Mcg  Tabs (Cyanocobalamin) .... Take 1 Tablet By Mouth Once A Day 8)   Guaifenesin-Codeine 100-10 Mg/32ml Syrp (Guaifenesin-Codeine) .[name].. 1-2 Tsp By Mouth At Bedtime As Needed Cough  Allergies (verified): No Known Drug Allergies  Past History:  Past medical, surgical, family and social histories (including risk factors) reviewed, and no changes noted (except as noted below).  Past Medical History: Reviewed history from 08/24/2008 and no changes required. GERD Allergic rhinitis  Past Surgical History: Reviewed history from 08/24/2008 and no changes required. C [phone] and 2000 Appendectomy Tonsillectomy  Family History: Reviewed history from 08/24/2008 and no changes required. father: HTN, arthritis, sleep apnea mother: high chol, osteoporosis brother:sleep apnea no cancer no MI < age 44  Social History: Reviewed history from 08/24/2008 and no changes required. 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  Physical Exam  General:  Well-developed,well-nourished,in no acute distress; alert,appropriate and cooperative throughout examination Ears:  External ear exam shows no significant lesions or deformities.  Otoscopic examination reveals clear canals, tympanic membranes are intact bilaterally without bulging, retraction, inflammation or discharge. Hearing is grossly normal bilaterally. Nose:  nasal dischargemucosal pallor.   Mouth:  Oral mucosa and oropharynx without lesions or exudates.  Teeth in good repair. Neck:  no carotid bruit or thyromegaly no cervical or supraclavicular lymphadenopathy  Lungs:  Normal respiratory effort, chest expands symmetrically. Lungs are clear to auscultation, no crackles or wheezes. Heart:  Normal rate and regular rhythm. S1 and S2 normal without gallop, murmur, click, rub or other extra sounds. Skin:  subcutaneous cyst central back  drained with palpation, no redness, was mildly tender to patient.    Impression & Recommendations:  Problem # 1:  COUGH (ICD-786.2) Post inflammatory bronchospasm..no sign of continued infection..countinue albuterol, start steroid course. Treat allergies. Call if fever, SOB, wheeze.  Complete Medication List: 1)  Omeprazole 40 Mg Cpdr (Omeprazole) .... Take 1 tablet by mouth once a day 2)  Zyrtec Allergy 10 Mg Tabs (Cetirizine hcl) .... Take 1 tablet by mouth once a day 3)  Proair Hfa 108 (90 Base) Mcg/act Aers (Albuterol sulfate) .... 2 puffs every 4 hours as needed 4)  Singulair 10 Mg Tabs (Montelukast sodium) .[name].. 1 tab by mouth at bedtime 5)  Multivitamins Tabs (Multiple vitamin) .... Take 1 tablet by mouth once a day 6)  Calcium Carbonate-vitamin D 600-400 Mg-unit Tabs (Calcium carbonate-vitamin d) .... Take 1 tablet by mouth once a day 7)  Vitamin B-12 500 Mcg Tabs (Cyanocobalamin) .... Take 1 tablet by mouth once a day 8)  Guaifenesin-codeine 100-10 Mg/37ml Syrp (Guaifenesin-codeine) .[name].. 1-2 tsp by mouth at bedtime as needed cough 9)  Fluticasone Propionate 50 Mcg/act Susp (Fluticasone propionate) .... 2 puff in each nostril daily 10)  Prednisone 10 Mg Tabs (Prednisone) .... 3 tabs by mouth daily x 3 days, then 2 tabs by mouth daily x 2 days then 1 tab by mouth daily x 2 days  Patient Instructions: 1)  Start prednisone course. 2)  Use proair as needed and restart allergy meds daily.  3)   Call if fever, shortness of breath, productive cough. Prescriptions: PREDNISONE 10 MG TABS (PREDNISONE) 3 tabs by mouth daily x 3 days, then 2 tabs by mouth daily x 2 days then 1 tab by mouth daily x 2 days  #15 x 0   Entered and Authorized by:   Kerby Nora MD   Signed by:   Kerby Nora MD on 08/05/2009   Method used:   Electronically to        [facility]  [address] [phone][facility] (retail)       [address]       [place], [place]  [address]       Ph: [phone]       Fax: [phone]    RxID:   [id number] FLUTICASONE PROPIONATE 50 MCG/ACT SUSP (FLUTICASONE PROPIONATE) 2 puff in each nostril daily  #1 x 3   Entered and Authorized by:   Kerby Nora MD   Signed by:   Kerby Nora MD on 08/05/2009   Method used:   Electronically to        [facility]  [address] [phone]* (retail)       [address]       [place], [place]  [address]       Ph: [phone]       Fax: [phone]   RxID:   [id number] PROAIR HFA 108 (90 BASE) MCG/ACT AERS (ALBUTEROL SULFATE) 2 puffs every 4 hours as needed  #1 x 2   Entered and Authorized by:   Kerby Nora MD   Signed by:   Kerby Nora MD on 08/05/2009   Method used:   [facility] to  [facility] [phone]* (retail)       [address]       [place], [place]  [address]       Ph: [phone]       Fax: [phone]   RxID:   [id number]   Current Allergies (reviewed today): No known allergies

## 2010-05-30 NOTE — Letter (Signed)
Summary: Results Follow up Letter  [facility]  [address] [place], [place] [address]   Phone: [phone]  Fax: [phone]    09/20/2008 MRN: [id number]    [facility] [address] [place], [place]  [address]    Dear Ms. Forner,  The following are the results of your recent test(s):  Test         Result    Pap Smear:        Normal _____  Not Normal _____ Comments: ______________________________________________________ Cholesterol: LDL(Bad cholesterol):         Your goal is less than:         HDL (Good cholesterol):       Your goal is more than: Comments:  ______________________________________________________ Mammogram:        Normal __X___  Not Normal _____ Comments:  Yearly follow up is recommended.   ___________________________________________________________________ Hemoccult:        Normal _____  Not normal _______ Comments:    _____________________________________________________________________ Other Tests:    We routinely do not discuss normal results over the telephone.  If you desire a copy of the results, or you have any questions about this information we can discuss them at your next office visit.   Sincerely,     Excell Seltzer, M.D.  AEB:lsf

## 2010-05-30 NOTE — Progress Notes (Signed)
Summary: wants something for cough  Phone Note Call from Patient Call back at [phone]   Caller: Patient Call For: Kerby Nora MD Summary of Call: Patient was given Z-pak on 07-13-09. She says that now she has developed a terrible cough that she can't find any relief for it. She wants to know if she can get something for the [facility] [address]  Initial call taken by: Melody Comas,  July 15, 2009 10:16 AM    New/Updated Medications: TESSALON PERLES 100 MG  CAPS (BENZONATATE) 1 tab by mouth three times a day as needed cough Prescriptions: TESSALON PERLES 100 MG  CAPS (BENZONATATE) 1 tab by mouth three times a day as needed cough  #30 x 0   Entered and Authorized by:   Ruthe Mannan MD   Signed by:   Ruthe Mannan MD on 07/15/2009   Method used:   Electronically to        [facility] [phone]* (retail)       [address]       [place], [place]  [address]       Ph: [phone]       Fax: [phone]   RxID:   [id number]

## 2010-05-30 NOTE — Assessment & Plan Note (Signed)
Summary: CPX W/PAP/[facility]H   Vital Signs:  Patient profile:   45 year old female LMP:     08/05/2008 Height:      63 inches Weight:      197.38 pounds BMI:     35.09 Temp:     98.4 degrees F oral Pulse rate:   84 / minute Pulse rhythm:   regular BP sitting:   132 / 90  (left arm) Cuff size:   regular  Vitals Entered By: Delilah Shan (Sep 01, 2008 3:09 PM) CC: Asthma Management LMP (date): 08/05/2008  years   days  Enter LMP: 08/05/2008 Last PAP Result normal   History of Present Illness: Chief Complaint:  CPX with Pap.  Need 90 day Rx. Omeprazole to print and give to patient.  The patient is here for annual wellness exam and preventative care.    Doing well overall. Interested in full body skin exam to eval multiple moles...none particularly worrisome.  Asthma History:      Today's PEFR value is 410.      Problems Prior to Update: 1)  Routine Gynecological Examination  (ICD-V72.31) 2)  Well Woman  (ICD-V70.0) 3)  Other Screening Mammogram  (ICD-V76.12) 4)  Screening For Lipoid Disorders  (ICD-V77.91) 5)  Skin Rash  (ICD-782.1) 6)  Hyperthyroidism, Subclinical  (ICD-242.90) 7)  Allergic Rhinitis  (ICD-477.9) 8)  Gerd  (ICD-530.81) 9)  Diabetes Mellitus, Gestational  (ICD-648.80) 10)  Asthma, Intermittent, Mild  (ICD-493.90)  Current Medications (verified): 1)  Omeprazole 40 Mg Cpdr (Omeprazole) .... Take 1 Tablet By Mouth Once A Day 2)  Zyrtec Allergy 10 Mg Tabs (Cetirizine Hcl) .... Take 1 Tablet By Mouth Once A Day 3)  Flonase 50 Mcg/act Susp (Fluticasone Propionate) .... 2 Sprays Each Nostril Once Daily 4)  Advair Diskus 250-50 Mcg/dose Misc (Fluticasone-Salmeterol) .... As Needed 5)  Proair Hfa 108 (90 Base) Mcg/act Aers (Albuterol Sulfate) .... 2 Puffs Every 4 Hours As Needed 6)  Singulair 10 Mg Tabs (Montelukast Sodium) .[name].. 1 Tab By Mouth At Bedtime 7)  Triamcinolone Acetonide 0.5 % Crea (Triamcinolone Acetonide) .... Aaa Two Times A Day X 2  Week  Allergies (verified): No Known Drug Allergies  Past History:  Past medical, surgical, family and social histories (including risk factors) reviewed, and no changes noted (except as noted below).  Past Medical History:    Reviewed history from 08/24/2008 and no changes required:    GERD    Allergic rhinitis  Past Surgical History:    Reviewed history from 08/24/2008 and no changes required:    C [phone] and 2000    Appendectomy    Tonsillectomy  Family History:    Reviewed history from 08/24/2008 and no changes required:       father: HTN, arthritis, sleep apnea       mother: high chol, osteoporosis       brother:sleep apnea       no cancer       no MI < age 18  Social History:    Reviewed history from 08/24/2008 and no changes required:       Occupation: Corporate treasurer rep       Married       2 children: healthy       Former Smoker, remote social for 2 years       Alcohol use-yes, 1-2 drinks per month       Drug use-no       Regular exercise-yes, walk  Diet: fast food, trying to improve diet.  Review of Systems General:  Complains of fatigue; she feels fatigue is appropriate given stress, job and family. CV:  Denies chest pain or discomfort. Resp:  Denies shortness of breath. GI:  Denies abdominal pain, bloody stools, constipation, and diarrhea.  Physical Exam  General:  overweight female in NAD Eyes:  No corneal or conjunctival inflammation noted. EOMI. Perrla. Funduscopic exam benign, without hemorrhages, exudates or papilledema. Vision grossly normal. Ears:  External ear exam shows no significant lesions or deformities.  Otoscopic examination reveals clear canals, tympanic membranes are intact bilaterally without bulging, retraction, inflammation or discharge. Hearing is grossly normal bilaterally. Nose:  External nasal examination shows no deformity or inflammation. Nasal mucosa are pink and moist without lesions or exudates. Mouth:  Oral mucosa  and oropharynx without lesions or exudates.  Teeth in good repair. Neck:  no carotid bruit or thyromegaly no cervical or supraclavicular lymphadenopathy  Chest Wall:  No deformities, masses, or tenderness noted. Breasts:  No mass, nodules, thickening, tenderness, bulging, retraction, inflamation, nipple discharge or skin changes noted.   Lungs:  Normal respiratory effort, chest expands symmetrically. Lungs are clear to auscultation, no crackles or wheezes. Heart:  Normal rate and regular rhythm. S1 and S2 normal without gallop, murmur, click, rub or other extra sounds. Abdomen:  Bowel sounds positive,abdomen soft and non-tender without masses, organomegaly or hernias noted. Genitalia:  Pelvic Exam:        External: normal female genitalia without lesions or masses        Vagina: normal without lesions or masses        Cervix: normal without lesions or masses        Adnexa: normal bimanual exam without masses or fullness        Uterus: normal by palpation        Pap smear: performed Pulses:  R and L posterior tibial pulses are full and equal bilaterally  Extremities:  No clubbing, cyanosis, edema, or deformity noted with normal full range of motion of all joints.   Skin:  B elbow with well circmscribed white grey plaue, dry skin, 2 areas with raised warty like appearance Psych:  Cognition and judgment appear intact. Alert and cooperative with normal attention span and concentration. No apparent delusions, illusions, hallucinations   Impression & Recommendations:  Problem # 1:  WELL WOMAN (ICD-V70.0) Reviewed preventive care protocols, scheduled due services, and updated immunizations. Encouraged exercise, weight loss, healthy eating habits.   Problem # 2:  ROUTINE GYNECOLOGICAL EXAMINATION (ICD-V72.31) Pap pending.   Complete Medication List: 1)  Omeprazole 40 Mg Cpdr (Omeprazole) .... Take 1 tablet by mouth once a day 2)  Zyrtec Allergy 10 Mg Tabs (Cetirizine hcl) .... Take 1 tablet by  mouth once a day 3)  Flonase 50 Mcg/act Susp (Fluticasone propionate) .... 2 sprays each nostril once daily 4)  Advair Diskus 250-50 Mcg/dose Misc (Fluticasone-salmeterol) .... As needed 5)  Proair Hfa 108 (90 Base) Mcg/act Aers (Albuterol sulfate) .... 2 puffs every 4 hours as needed 6)  Singulair 10 Mg Tabs (Montelukast sodium) .[name].. 1 tab by mouth at bedtime 7)  Triamcinolone Acetonide 0.5 % Crea (Triamcinolone acetonide) .... Aaa two times a day x 2 week  Other Orders: Radiology Referral (Radiology)  Patient Instructions: 1)  Referral Appointment Information 2)  Day/Date: 3)  Time: 4)  Place/MD: 5)  Address: 6)  Phone/Fax: 7)  Patient given appointment information. Information/Orders faxed/mailed.  Prescriptions: OMEPRAZOLE 40 MG CPDR (OMEPRAZOLE) Take  1 tablet by mouth once a day  #90 x 3   Entered and Authorized by:   Kerby Nora MD   Signed by:   Kerby Nora MD on 09/01/2008   Method used:   Print then Give to Patient   RxID:   [id number]     Current Allergies (reviewed today): No known allergies     Tetanus/Td Vaccine    Vaccine Type: Tdap   Appended Document: CPX W/PAP/RBH   Tetanus/Td Vaccine    Vaccine Type: Tdap    Site: left deltoid    Mfr: [employer]oSmithK[employer]    Dose: 0.5 ml    Route: IM    Given by: Delilah Shan    Exp. Date: 06/23/2010    Lot #: [id number]    VIS given: 03/18/07 version given Sep 01, 2008.

## 2010-05-30 NOTE — Assessment & Plan Note (Signed)
Summary: 1 m f/u spirometry/dlo   Vital Signs:  Patient profile:   45 year old female Height:      63 inches Weight:      201.4 pounds BMI:     35.81 Temp:     98.5 degrees F oral Pulse rate:   84 / minute Pulse rhythm:   regular BP sitting:   110 / 80  (left arm) Cuff size:   large  Vitals Entered By: Benny Lennert CMA Duncan Dull) (Sep 27, 2009 12:29 PM)  History of Present Illness: Chief complaint 1 monhth follow up spirometry   Chronic cough, wheeze: On Advair x 1 month, 90% improvement with cough. no further wheezing. Not using albuterol.    Spirometry today is normal.   Problems Prior to Update: 1)  Cough  (ICD-786.2) 2)  Uri  (ICD-465.9) 3)  Ulnar Neuropathy  (ICD-354.2) 4)  Back Pain, Acute  (ICD-724.5) 5)  Anxiety Depression  (ICD-300.4) 6)  Routine Gynecological Examination  (ICD-V72.31) 7)  Well Woman  (ICD-V70.0) 8)  Other Screening Mammogram  (ICD-V76.12) 9)  Screening For Lipoid Disorders  (ICD-V77.91) 10)  Skin Rash  (ICD-782.1) 11)  Hyperthyroidism, Subclinical  (ICD-242.90) 12)  Allergic Rhinitis  (ICD-477.9) 13)  Gerd  (ICD-530.81) 14)  Diabetes Mellitus, Gestational  (ICD-648.80) 15)  Asthma, Intermittent, Mild  (ICD-493.90)  Current Medications (verified): 1)  Omeprazole 40 Mg Cpdr (Omeprazole) .... Take 1 Tablet By Mouth Once A Day 2)  Zyrtec Allergy 10 Mg Tabs (Cetirizine Hcl) .... Take 1 Tablet By Mouth Once A Day 3)  Proair Hfa 108 (90 Base) Mcg/act Aers (Albuterol Sulfate) .... 2 Puffs Every 4 Hours As Needed 4)  Singulair 10 Mg Tabs (Montelukast Sodium) .[name].. 1 Tab By Mouth At Bedtime 5)  Multivitamins   Tabs (Multiple Vitamin) .... Take 1 Tablet By Mouth Once A Day 6)  Calcium Carbonate-Vitamin D 600-400 Mg-Unit  Tabs (Calcium Carbonate-Vitamin D) .... Take 1 Tablet By Mouth Once A Day 7)  Vitamin B-12 500 Mcg  Tabs (Cyanocobalamin) .... Take 1 Tablet By Mouth Once A Day 8)  Guaifenesin-Codeine 100-10 Mg/42ml Syrp (Guaifenesin-Codeine) .[name].. 1-2  Tsp By Mouth At Bedtime As Needed Cough 9)  Fluticasone Propionate 50 Mcg/act Susp (Fluticasone Propionate) .... 2 Puff in Each Nostril Daily 10)  Advair Diskus 250-50 Mcg/dose Aepb (Fluticasone-Salmeterol) .[name].. 1 Inh Two Times A Day  Allergies (verified): No Known Drug Allergies  Past History:  Past medical, surgical, family and social histories (including risk factors) reviewed, and no changes noted (except as noted below).  Past Medical History: Reviewed history from 08/24/2008 and no changes required. GERD Allergic rhinitis  Past Surgical History: Reviewed history from 08/24/2008 and no changes required. C [phone] and 2000 Appendectomy Tonsillectomy  Family History: Reviewed history from 08/24/2008 and no changes required. father: HTN, arthritis, sleep apnea mother: high chol, osteoporosis brother:sleep apnea no cancer no MI < age 47  Social History: Reviewed history from 08/24/2008 and no changes required. 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  Review of Systems General:  Denies fatigue and fever. CV:  Denies chest pain or discomfort and swelling of feet. Resp:  Denies coughing up blood and shortness of breath.  Physical Exam  General:  Well-developed,well-nourished,in no acute distress; alert,appropriate and cooperative throughout examination Mouth:  Oral mucosa and oropharynx without lesions or exudates.  Teeth in  good repair. Neck:  no carotid bruit or thyromegaly no cervical or supraclavicular lymphadenopathy  Lungs:  Normal respiratory effort, chest expands symmetrically. Lungs are clear to auscultation, no crackles or wheezes. Heart:  Normal rate and regular rhythm. S1 and S2 normal without gallop, murmur, click, rub or other extra sounds. Pulses:  R and L posterior  tibial pulses are full and equal bilaterally  Extremities:  no edema   Impression & Recommendations:  Problem # 1:  ASTHMA, INTERMITTENT, MILD (ICD-493.90)  Improroved on advair. Continue.  The following medications were removed from the medication list:    Prednisone 10 Mg Tabs (Prednisone) .Marland KitchenMarland KitchenMarland KitchenMarland Kitchen 3 tabs by mouth daily x 3 days, then 2 tabs by mouth daily x 2 days then 1 tab by mouth daily x 2 days Her updated medication list for this problem includes:    Proair Hfa 108 (90 Base) Mcg/act Aers (Albuterol sulfate) .[name]... 2 puffs every 4 hours as needed    Singulair 10 Mg Tabs (Montelukast sodium) .[name]... 1 tab by mouth at bedtime    Advair Diskus 250-50 Mcg/dose Aepb (Fluticasone-salmeterol) .[name]... 1 inh two times a day  Orders: Spirometry w/Graph (94010)  Complete Medication List: 1)  Omeprazole 40 Mg Cpdr (Omeprazole) .... Take 1 tablet by mouth once a day 2)  Zyrtec Allergy 10 Mg Tabs (Cetirizine hcl) .... Take 1 tablet by mouth once a day 3)  Proair Hfa 108 (90 Base) Mcg/act Aers (Albuterol sulfate) .... 2 puffs every 4 hours as needed 4)  Singulair 10 Mg Tabs (Montelukast sodium) .[name].. 1 tab by mouth at bedtime 5)  Multivitamins Tabs (Multiple vitamin) .... Take 1 tablet by mouth once a day 6)  Calcium Carbonate-vitamin D 600-400 Mg-unit Tabs (Calcium carbonate-vitamin d) .... Take 1 tablet by mouth once a day 7)  Vitamin B-12 500 Mcg Tabs (Cyanocobalamin) .... Take 1 tablet by mouth once a day 8)  Guaifenesin-codeine 100-10 Mg/43ml Syrp (Guaifenesin-codeine) .[name].. 1-2 tsp by mouth at bedtime as needed cough 9)  Fluticasone Propionate 50 Mcg/act Susp (Fluticasone propionate) .... 2 puff in each nostril daily 10)  Advair Diskus 250-50 Mcg/dose Aepb (Fluticasone-salmeterol) .[name].. 1 inh two times a day  Current Allergies (reviewed today): No known allergies

## 2010-05-30 NOTE — Letter (Signed)
Summary: Dr.Fozia [facility] Records  Dr[facility]ozia [facility] Records   Imported By: Beau Fanny 08/24/2008 13:35:04  _____________________________________________________________________  External Attachment:    Type:   Image     Comment:   External Document

## 2010-05-30 NOTE — Assessment & Plan Note (Signed)
Summary: EAR PAIN/CLE   Vital Signs:  Patient profile:   45 year old female Weight:      201 pounds Temp:     98.9 degrees F oral BP sitting:   120 / 80  (left arm) Cuff size:   large  Vitals Entered By: Mervin Hack CMA Duncan Dull) (October 17, 2009 12:33 PM) CC: right ear pain   History of Present Illness: Having ear pain tender on outside on right and thinks she has swimmers ear some pain down preauricular area was at beach  last week--swam in ocean  No cough  No rhinorrhea no fever no ear discharge  Tried some OTC drops as preventative then used drops for pain not clearly helpful  Allergies: No Known Drug Allergies  Past History:  Past medical, surgical, family and social histories (including risk factors) reviewed for relevance to current acute and chronic problems.  Past Medical History: Reviewed history from 08/24/2008 and no changes required. GERD Allergic rhinitis  Past Surgical History: Reviewed history from 08/24/2008 and no changes required. C [phone] and 2000 Appendectomy Tonsillectomy  Family History: Reviewed history from 08/24/2008 and no changes required. father: HTN, arthritis, sleep apnea mother: high chol, osteoporosis brother:sleep apnea no cancer no MI < age 70  Social History: Reviewed history from 08/24/2008 and no changes required. 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  Review of Systems       no vomiting  eating okay otherwise feels well  Physical Exam  General:  alert and normal appearance.   Head:  normocephalic and atraumatic.   Ears:  L ear normal.    Tragal tenderness on right mild erythema in canal TM normal Nose:  mild congestion Mouth:  no erythema and no exudates.   Neck:  supple, no masses, and no cervical  lymphadenopathy.     Impression & Recommendations:  Problem # 1:  OTITIS EXTERNA (ICD-380.10) Assessment New  fairly typical presentation  will Rx with cortisporin discussed prevention as well  Her updated medication list for this problem includes:    Neomycin-polymyxin-hc 3.5-10000-1 Susp (Neomycin-polymyxin-hc) .Marland KitchenMarland KitchenMarland KitchenMarland Kitchen 5 drops in right ear four times daily for 5 days  Complete Medication List: 1)  Omeprazole 40 Mg Cpdr (Omeprazole) .... Take 1 tablet by mouth once a day 2)  Proair Hfa 108 (90 Base) Mcg/act Aers (Albuterol sulfate) .... 2 puffs every 4 hours as needed 3)  Singulair 10 Mg Tabs (Montelukast sodium) .[name].. 1 tab by mouth at bedtime 4)  Fluticasone Propionate 50 Mcg/act Susp (Fluticasone propionate) .... 2 puff in each nostril daily 5)  Zyrtec Allergy 10 Mg Tabs (Cetirizine hcl) .... Take 1 tablet by mouth once a day 6)  Multivitamins Tabs (Multiple vitamin) .... Take 1 tablet by mouth once a day 7)  Calcium Carbonate-vitamin D 600-400 Mg-unit Tabs (Calcium carbonate-vitamin d) .... Take 1 tablet by mouth once a day 8)  Vitamin B-12 500 Mcg Tabs (Cyanocobalamin) .... Take 1 tablet by mouth once a day 9)  Neomycin-polymyxin-hc 3.5-10000-1 Susp (Neomycin-polymyxin-hc) .... 5 drops in right ear four times daily for 5 days  Patient Instructions: 1)  Please schedule a follow-up appointment as needed .  Prescriptions: NEOMYCIN-POLYMYXIN-HC 3.5-10000-1 SUSP (NEOMYCIN-POLYMYXIN-HC) 5 drops in right ear four times daily for 5 days  #1 bottle x 3   Entered and Authorized by:   Cindee Salt MD  Signed by:   Cindee Salt MD on 10/17/2009   Method used:   [facility] to        [facility] [phone]* (retail)       [address]       [place], [place]  [address]       Ph: [phone]       Fax: [phone]   RxID:   [id number]   Current Allergies (reviewed today): No known allergies

## 2010-06-01 NOTE — Progress Notes (Signed)
Summary: pantoprazole  Phone Note Refill Request Message from:  Patient on April 12, 2010 8:39 AM  Refills Requested: Medication #1:  PANTOPRAZOLE SODIUM 40 MG TBEC 1 tab by mouth daily [facility]   Initial call taken by: Melody Comas,  April 12, 2010 8:39 AM  Follow-up for Phone Call        Rx faxed to pharmacy Follow-up by: Benny Lennert CMA Duncan Dull),  April 12, 2010 9:36 AM    Prescriptions: PANTOPRAZOLE SODIUM 40 MG TBEC (PANTOPRAZOLE SODIUM) 1 tab by mouth daily  #90 x 3   Entered by:   Benny Lennert CMA (AAMA)   Authorized by:   Kerby Nora MD   Signed by:   Benny Lennert CMA (AAMA) on 04/12/2010   Method used:   Faxed to ...       [facility]             , [place]         Ph: [phone]       Fax: [phone]   RxID:   [id number]

## 2010-06-01 NOTE — Assessment & Plan Note (Signed)
Summary: F/U COUGH/CLE   Vital Signs:  Patient profile:   45 year old female Height:      63 inches Weight:      197.25 pounds BMI:     35.07 O2 Sat:      98 % on Room air Temp:     98.2 degrees F oral Pulse rate:   96 / minute Pulse rhythm:   regular Resp:     20 per minute BP sitting:   116 / 82  (left arm) Cuff size:   large  Vitals Entered By: Lewanda Rife LPN (April 25, 2010 12:31 PM)  O2 Flow:  Room air CC: follow-up visit of cough   History of Present Illness: CC: f/u cough  seen last week, dx asthma flare with possible bronchitis, treated with prednisone (has 2 more days) and albuterol Q4-6 hour which seems to help.  Filled zpack and taking, has one day left.  Cough worsened.  worse at night, delsym and robitussin not cutting it.  Productive of mild sputum.  Feels stuff there but unable to bring up.  Also felt heavy chest last night so wanted to get checked out again.  + hoarseness.  + congestion, ST from coughing.   ~6d illness now  No fevers/chills, nausea/vomiting, abd pain, diarrhea.  No HA.  + husband sick.  + son with strep.  h/o PNA in past, scared for rpt.  Current Medications (verified): 1)  Pantoprazole Sodium 40 Mg Tbec (Pantoprazole Sodium) .[name].. 1 Tab By Mouth Daily 2)  Proair Hfa 108 (90 Base) Mcg/act Aers (Albuterol Sulfate) .... 2 Puffs Every 4 Hours As Needed 3)  Singulair 10 Mg Tabs (Montelukast Sodium) .[name].. 1 Tab By Mouth At Bedtime 4)  Fluticasone Propionate 50 Mcg/act Susp (Fluticasone Propionate) .... 2 Puff in Each Nostril Daily 5)  Zyrtec Allergy 10 Mg Tabs (Cetirizine Hcl) .... Take 1 Tablet By Mouth Once A Day 6)  Multivitamins   Tabs (Multiple Vitamin) .... Take 1 Tablet By Mouth Once A Day 7)  Calcium Carbonate-Vitamin D 600-400 Mg-Unit  Tabs (Calcium Carbonate-Vitamin D) .... Take 1 Tablet By Mouth Once A Day 8)  Vitamin B-12 500 Mcg  Tabs (Cyanocobalamin) .... Take 1 Tablet By Mouth Once A Day 9)  Prednisone 20 Mg Tabs (Prednisone)  .... Take 2 Daily X 7 Days 10)  Triamcinolone Acetonide 0.5 % Crea (Triamcinolone Acetonide) .... Apply To Aa On Thumb Bid X 2 Wks 11)  Zithromax Z-Pak 250 Mg Tabs (Azithromycin) .... Use As Directed 12)  Mucinex Dm 30-600 Mg Xr12h-Tab (Dextromethorphan-Guaifenesin) .... Otc As Directed.  Allergies (verified): No Known Drug Allergies  Past History:  Past Medical History: GERD Allergic rhinitis asthma  Social History: Reviewed history from 08/24/2008 and no changes required. 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  Review of Systems       per HPI  Physical Exam  General:  Well-developed,well-nourished,in no acute distress; alert,appropriate and cooperative throughout examination Head:  normocephalic and atraumatic.  no sinus tenderness Eyes:  No corneal or conjunctival inflammation noted. EOMI. Perrla. Ears:  Tms clear bilaterally Nose:  nares clear bilaterally, pink and moist Mouth:  no erythema/edema.  MMM Neck:  no carotid bruit or thyromegaly no cervical or supraclavicular lymphadenopathy  Lungs:  normal WOB, somewhat distant breath sounds, no wheezing or rales.  overall clear Heart:  Normal rate and regular rhythm. S1 and S2 normal without gallop, murmur, click, rub or other extra sounds. Abdomen:  Bowel sounds positive,abdomen soft and non-tender without masses, organomegaly or hernias noted. Pulses:  2+ rad pulses Extremities:  no pedal edema, brisk cap refill Skin:  improved lesion thumb   Impression & Recommendations:  Problem # 1:  ACUTE BRONCHITIS (ICD-466.0) asthmatic bronchitis.  no indication of pneumonia, lungs clear, vss stable, no fever.  no significant productive cough.  monitor for now, no CXR.  advised of red flags to return/update for consideratino of CXR.  continue  course for now.  prolonged steroid course, added taper.  added cough syrup for nighttime use.  Her updated medication list for this problem includes:    Proair Hfa 108 (90 Base) Mcg/act Aers (Albuterol sulfate) .[name]... 2 puffs every 4 hours as needed    Singulair 10 Mg Tabs (Montelukast sodium) .[name]... 1 tab by mouth at bedtime    Zithromax Z-pak 250 Mg Tabs (Azithromycin) ..... Use as directed    Cheratussin Ac 100-10 Mg/75ml Syrp (Guaifenesin-codeine) .[name]... Take one teaspoon q6 hours as needed cough, sedation precautions  Problem # 2:  SKIN RASH (ICD-782.1) much imporved on oral steroids.  advised to use triamcinolone x 1-2 wks after oral steroids to ensure resolution. likely posion ivy. Her updated medication list for this problem includes:    Triamcinolone Acetonide 0.5 % Crea (Triamcinolone acetonide) .[name]... Apply to aa on thumb bid x 2 wks  Complete Medication List: 1)  Pantoprazole Sodium 40 Mg Tbec (Pantoprazole sodium) .[name].. 1 tab by mouth daily 2)  Proair Hfa 108 (90 Base) Mcg/act Aers (Albuterol sulfate) .... 2 puffs every 4 hours as needed 3)  Singulair 10 Mg Tabs (Montelukast sodium) .[name].. 1 tab by mouth at bedtime 4)  Fluticasone Propionate 50 Mcg/act Susp (Fluticasone propionate) .... 2 puff in each nostril daily 5)  Zyrtec Allergy 10 Mg Tabs (Cetirizine hcl) .... Take 1 tablet by mouth once a day 6)  Multivitamins Tabs (Multiple vitamin) .... Take 1 tablet by mouth once a day 7)  Calcium Carbonate-vitamin D 600-400 Mg-unit Tabs (Calcium carbonate-vitamin d) .... Take 1 tablet by mouth once a day 8)  Vitamin B-12 500 Mcg Tabs (Cyanocobalamin) .... Take 1 tablet by mouth once a day 9)  Prednisone 10 Mg Tabs (Prednisone) .... 3 for 2 days then 2 for 2 days then 1 for 2 days 10)  Triamcinolone Acetonide 0.5 % Crea (Triamcinolone acetonide) .... Apply to aa on thumb bid x 2 wks 11)  Zithromax Z-pak 250 Mg Tabs (Azithromycin) .... Use as directed 12)  Cheratussin Ac 100-10 Mg/57ml Syrp  (Guaifenesin-codeine) .... Take one teaspoon q6 hours as needed cough, sedation precautions  Patient Instructions: 1)  Still looks like bronchitis/asthma flare. 2)  Things to watch out for - return of fever, worsening cough or shortness of breath.  If this happens, call us for CXR. 3)  Hope you start feeling better. 4)  Cough syrup at night. Prescriptions: PREDNISONE 10 MG TABS (PREDNISONE) 3 for 2 days then 2 for 2 days then 1 for 2 days  #12 x 0   Entered and Authorized by:   Eustaquio Boyden  MD   Signed by:   Eustaquio Boyden  MD on 04/25/2010   Method used:   [facility]        [facility]  [address] [phone]* (retail)       [address]       [place]  [place], [place]  [address]       Ph: [phone]       Fax: [phone]   RxID:   [id number] CHERATUSSIN AC 100-10 MG/5ML SYRP (GUAIFENESIN-CODEINE) take one teaspoon q6 hours as needed cough, sedation precautions  #100cc x 0   Entered and Authorized by:   Eustaquio Boyden  MD   Signed by:   Eustaquio Boyden  MD on 04/25/2010   Method used:   Print then Give to Patient   RxID:   [id number]    Orders Added: 1)  Est. Patient Level III [59563]    Current Allergies (reviewed today): No known allergies

## 2010-06-01 NOTE — Assessment & Plan Note (Signed)
Summary: sore throat, congestion/alc   Vital Signs:  Patient profile:   45 year old female Weight:      198 pounds O2 Sat:      97 % on Room air Temp:     98.4 degrees F oral Pulse rate:   88 / minute Pulse rhythm:   regular BP sitting:   118 / 80  (left arm) Cuff size:   large  Vitals Entered By: Selena Batten Dance CMA Duncan Dull) (April 20, 2010 3:17 PM)  O2 Flow:  Room air CC: Congestion, rash on right hand   History of Present Illness: CC: congestion, prone to PNA  feeling tight breathing since last night.  Woke up this morning with congestion, ST, back soreness.  Hasn't tried anything yet.  No RN, itchy eyes.  No fevers/chills, abd pain, n/v/d, HA, myalgias, arthralgias.  No smokers at home.  + h/o allergies and asthma per patient.  R thumb lesion thinks from poison ivy, has tried OTC meds.  since last visit 02/24/2010.  goes away, then comes back.  months.  Current Medications (verified): 1)  Pantoprazole Sodium 40 Mg Tbec (Pantoprazole Sodium) .[name].. 1 Tab By Mouth Daily 2)  Proair Hfa 108 (90 Base) Mcg/act Aers (Albuterol Sulfate) .... 2 Puffs Every 4 Hours As Needed 3)  Singulair 10 Mg Tabs (Montelukast Sodium) .[name].. 1 Tab By Mouth At Bedtime 4)  Fluticasone Propionate 50 Mcg/act Susp (Fluticasone Propionate) .... 2 Puff in Each Nostril Daily 5)  Zyrtec Allergy 10 Mg Tabs (Cetirizine Hcl) .... Take 1 Tablet By Mouth Once A Day 6)  Multivitamins   Tabs (Multiple Vitamin) .... Take 1 Tablet By Mouth Once A Day 7)  Calcium Carbonate-Vitamin D 600-400 Mg-Unit  Tabs (Calcium Carbonate-Vitamin D) .... Take 1 Tablet By Mouth Once A Day 8)  Vitamin B-12 500 Mcg  Tabs (Cyanocobalamin) .... Take 1 Tablet By Mouth Once A Day  Allergies (verified): No Known Drug Allergies  Past History:  Social History: Last updated: 08/24/2008 Occupation: Corporate treasurer rep Married 2 children: healthy Former Smoker, remote social for 2 years Alcohol use-yes, 1-2 drinks per month Drug  use-no Regular exercise-yes, walk Diet: fast food, trying to improve diet.  Past Medical History: GERD Allergic rhinitis asthma per patinet  Review of Systems       per HPI  Physical Exam  General:  Well-developed,well-nourished,in no acute distress; alert,appropriate and cooperative throughout examination Head:  normocephalic and atraumatic.   Eyes:  No corneal or conjunctival inflammation noted. EOMI. Perrla. Ears:  Tms clear bilaterally Nose:  nares clear bilaterally, pink and moist Mouth:  no erythema/edema.  MMM Neck:  no carotid bruit or thyromegaly no cervical or supraclavicular lymphadenopathy  Lungs:  tight air movement, increased exp phase.  no wheezing. Heart:  Normal rate and regular rhythm. S1 and S2 normal without gallop, murmur, click, rub or other extra sounds. Pulses:  2+ rad pulses Extremities:  no pedal edema, brisk cap refill Skin:  lesion R inside thumb cluster of papules, slight erythema around, + pruritic.  + excoriation   Impression & Recommendations:  Problem # 1:  ASTHMA, WITH ACUTE EXACERBATION (ICD-493.92)  mild flare.  treat with steroids.  zpack to hold on to given weekend in case develops fever or wrosening breahting.  schedule albuterol for next 2 days.  update if not better.  Her updated medication list for this problem includes:    Proair Hfa 108 (90 Base) Mcg/act Aers (Albuterol sulfate) .[name]... 2 puffs every 4 hours as  needed    Singulair 10 Mg Tabs (Montelukast sodium) .[name]... 1 tab by mouth at bedtime    Prednisone 20 Mg Tabs (Prednisone) .[name]... Take 2 daily x 7 days  Pulmonary Functions Reviewed: PEFR: 410 (09/01/2008)   O2 sat: 97 (04/20/2010)  Complete Medication List: 1)  Pantoprazole Sodium 40 Mg Tbec (Pantoprazole sodium) .[name].. 1 tab by mouth daily 2)  Proair Hfa 108 (90 Base) Mcg/act Aers (Albuterol sulfate) .... 2 puffs every 4 hours as needed 3)  Singulair 10 Mg Tabs (Montelukast sodium) .[name].. 1 tab by mouth at bedtime 4)   Fluticasone Propionate 50 Mcg/act Susp (Fluticasone propionate) .... 2 puff in each nostril daily 5)  Zyrtec Allergy 10 Mg Tabs (Cetirizine hcl) .... Take 1 tablet by mouth once a day 6)  Multivitamins Tabs (Multiple vitamin) .... Take 1 tablet by mouth once a day 7)  Calcium Carbonate-vitamin D 600-400 Mg-unit Tabs (Calcium carbonate-vitamin d) .... Take 1 tablet by mouth once a day 8)  Vitamin B-12 500 Mcg Tabs (Cyanocobalamin) .... Take 1 tablet by mouth once a day 9)  Prednisone 20 Mg Tabs (Prednisone) .... Take 2 daily x 7 days 10)  Triamcinolone Acetonide 0.5 % Crea (Triamcinolone acetonide) .... Apply to aa on thumb bid x 2 wks 11)  Zithromax Z-pak 250 Mg Tabs (Azithromycin) .... Use as directed  Patient Instructions: 1)  Sounds like viral infection, may be a bit of asthma flare.   2)  treat with course of steroids. 3)  simple mucinex with plenty of fluid. 4)  Antibiotic to hold on to in case fevers or worsening over weekend. 5)  return if fevers >101.5, worsening breathing or other concerns. 6)  For thumb, try steroid cream. Prescriptions: ZITHROMAX Z-PAK 250 MG TABS (AZITHROMYCIN) use as directed  #1 x 0   Entered and Authorized by:   Eustaquio Boyden  MD   Signed by:   Eustaquio Boyden  MD on 04/20/2010   Method used:   Print then Give to Patient   RxID:   [id number] TRIAMCINOLONE ACETONIDE 0.5 % CREA (TRIAMCINOLONE ACETONIDE) apply to AA on thumb bid x 2 wks  #1 x 0   Entered and Authorized by:   Eustaquio Boyden  MD   Signed by:   Eustaquio Boyden  MD on 04/20/2010   Method used:   [facility]  [address] [phone][facility] (retail[facility]       [address]       [place], [place]  [address]       Ph: [phone]       Fax: [phone]   RxID:   [id number] PREDNISONE 20 MG TABS (PREDNISONE) take 2 daily x 7 days  #14 x 0   Entered and Authorized by:   Eustaquio Boyden  MD   Signed by:   Eustaquio Boyden  MD on 04/20/2010   Method used:    [facility]        [facility] [phone]* (retail)       [address]       [place], [place]  [address]       Ph: [phone]       Fax: [phone]   RxID:   [id number]    Orders Added: 1)  Est. Patient Level III [20254]    Current Allergies (reviewed today): No known allergies  Appended Document: sore throat, congestion/alc    Clinical Lists Changes  Problems: Added new problem of SKIN RASH (ICD-782.1) Assessed SKIN RASH as comment only - late entry - possible nummular eczema, but very localized.  ? contact dermatitis.  treat with steroid cream if oral steroids for asthma flare doesn't control. Her updated medication list for this problem includes:    Triamcinolone Acetonide 0.5 % Crea (Triamcinolone acetonide) .[name]... Apply to aa on thumb bid x 2 wks          Impression & Recommendations:  Problem # 1:  SKIN RASH (ICD-782.1) late entry - possible nummular eczema, but very localized.  ? contact dermatitis.  treat with steroid cream if oral steroids for asthma flare doesn't control. Her updated medication list for this problem includes:    Triamcinolone Acetonide 0.5 % Crea (Triamcinolone acetonide) .[name]... Apply to aa on thumb bid x 2 wks  Complete Medication List: 1)  Pantoprazole Sodium 40 Mg Tbec (Pantoprazole sodium) .[name].. 1 tab by mouth daily 2)  Proair Hfa 108 (90 Base) Mcg/act Aers (Albuterol sulfate) .... 2 puffs every 4 hours as needed 3)  Singulair 10 Mg Tabs (Montelukast sodium) .[name].. 1 tab by mouth at bedtime 4)  Fluticasone Propionate 50 Mcg/act Susp (Fluticasone propionate) .... 2 puff in each nostril daily 5)  Zyrtec Allergy 10 Mg Tabs (Cetirizine hcl) .... Take 1 tablet by mouth once a day 6)  Multivitamins Tabs (Multiple vitamin) .... Take 1 tablet by mouth once a day 7)  Calcium Carbonate-vitamin D 600-400 Mg-unit Tabs (Calcium carbonate-vitamin d) .... Take 1 tablet by mouth once a day 8)  Vitamin B-12 500 Mcg Tabs  (Cyanocobalamin) .... Take 1 tablet by mouth once a day 9)  Prednisone 20 Mg Tabs (Prednisone) .... Take 2 daily x 7 days 10)  Triamcinolone Acetonide 0.5 % Crea (Triamcinolone acetonide) .... Apply to aa on thumb bid x 2 wks 11)  Zithromax Z-pak 250 Mg Tabs (Azithromycin) .... Use as directed

## 2010-06-20 ENCOUNTER — Ambulatory Visit (INDEPENDENT_AMBULATORY_CARE_PROVIDER_SITE_OTHER): Payer: BC Managed Care – PPO | Admitting: Family Medicine

## 2010-06-20 ENCOUNTER — Encounter: Payer: Self-pay | Admitting: Family Medicine

## 2010-06-20 DIAGNOSIS — J45901 Unspecified asthma with (acute) exacerbation: Secondary | ICD-10-CM

## 2010-06-20 DIAGNOSIS — J069 Acute upper respiratory infection, unspecified: Secondary | ICD-10-CM

## 2010-06-20 DIAGNOSIS — J4520 Mild intermittent asthma, uncomplicated: Secondary | ICD-10-CM | POA: Insufficient documentation

## 2010-06-27 NOTE — Assessment & Plan Note (Signed)
Summary: COLD (?) / LFW   Vital Signs:  Patient profile:   45 year old female Height:      63 inches Weight:      198.75 pounds BMI:     35.33 Temp:     98.6 degrees F oral Pulse rate:   96 / minute Pulse rhythm:   regular BP sitting:   120 / 86  (left arm) Cuff size:   large  Vitals Entered By: Benny Lennert CMA Duncan Dull) (June 20, 2010 8:52 AM)  History of Present Illness: Chief complaint ? cold  Acute Visit History:      The patient complains of cough, earache, nasal discharge, and sore throat.  These symptoms began 5 days ago.  She denies chest pain, fever, headache, and sinus problems.  Other comments include: Using inhaler every 6 hours for wheeze. Advil for pain.  Usually asthma only triggered by illness .. usually well contolled asthma.        The patient notes wheezing.  The character of the cough is described as nonproductive.  There is no history of shortness of breath associated with her cough.        Problems Prior to Update: 1)  ? of Psoriasis  (ICD-696.1) 2)  Routine Gynecological Examination  (ICD-V72.31) 3)  Well Woman  (ICD-V70.0) 4)  Other Screening Mammogram  (ICD-V76.12) 5)  Screening For Lipoid Disorders  (ICD-V77.91) 6)  Hyperthyroidism, Subclinical  (ICD-242.90) 7)  Allergic Rhinitis  (ICD-477.9) 8)  Gerd  (ICD-530.81) 9)  Prediabetes  (ICD-790.29) 10)  Asthma, Intermittent, Mild  (ICD-493.90)  Current Medications (verified): 1)  Pantoprazole Sodium 40 Mg Tbec (Pantoprazole Sodium) .[name].. 1 Tab By Mouth Daily 2)  Proair Hfa 108 (90 Base) Mcg/act Aers (Albuterol Sulfate) .... 2 Puffs Every 4 Hours As Needed 3)  Singulair 10 Mg Tabs (Montelukast Sodium) .[name].. 1 Tab By Mouth At Bedtime 4)  Fluticasone Propionate 50 Mcg/act Susp (Fluticasone Propionate) .... 2 Puff in Each Nostril Daily 5)  Zyrtec Allergy 10 Mg Tabs (Cetirizine Hcl) .... Take 1 Tablet By Mouth Once A Day 6)  Multivitamins   Tabs (Multiple Vitamin) .... Take 1 Tablet By Mouth Once A  Day 7)  Calcium Carbonate-Vitamin D 600-400 Mg-Unit  Tabs (Calcium Carbonate-Vitamin D) .... Take 1 Tablet By Mouth Once A Day 8)  Vitamin B-12 500 Mcg  Tabs (Cyanocobalamin) .... Take 1 Tablet By Mouth Once A Day  Allergies (verified): No Known Drug Allergies  Past History:  Past medical, surgical, family and social histories (including risk factors) reviewed, and no changes noted (except as noted below).  Past Medical History: Reviewed history from 04/25/2010 and no changes required. GERD Allergic rhinitis asthma  Past Surgical History: Reviewed history from 08/24/2008 and no changes required. C [phone] and 2000 Appendectomy Tonsillectomy  Family History: Reviewed history from 08/24/2008 and no changes required. father: HTN, arthritis, sleep apnea mother: high chol, osteoporosis brother:sleep apnea no cancer no MI < age 24  Social History: Reviewed history from 08/24/2008 and no changes required. 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  Review of Systems      See HPI  Physical Exam  General:  overweight appearing female in NAD  Head:  no maxillary sinus ttp. Ears:  clear fluid B TMs Nose:  clear discharge, no mucosal pallor.   Mouth:  MMM, no exudate,  but 2 oropharyngel ulcers.  Neck:  no carotid bruit or thyromegaly no cervical or supraclavicular lymphadenopathy  Lungs:  Normal respiratory effort, chest expands symmetrically. Lungs are clear to auscultation, no crackles or wheezes. Heart:  Normal rate and regular rhythm. S1 and S2 normal without gallop, murmur, click, rub or other extra sounds. Pulses:  R and L posterior tibial pulses are full and equal bilaterally  Extremities:  no edema   Impression & Recommendations:  Problem # 1:  VIRAL URI (ICD-465.9)  The following  medications were removed from the medication list:    Cheratussin Ac 100-10 Mg/59ml Syrp (Guaifenesin-codeine) .[name]... Take one teaspoon q6 hours as needed cough, sedation precautions Her updated medication list for this problem includes:    Zyrtec Allergy 10 Mg Tabs (Cetirizine hcl) .[name]... Take 1 tablet by mouth once a day  Instructed on symptomatic treatment. Call if symptoms persist or worsen.   Problem # 2:  ASTHMA, WITH ACUTE EXACERBATION (ICD-493.92) Viral trigger. Start prednisone taper. Use albuterol as needed wheeze.  The following medications were removed from the medication list:    Prednisone 10 Mg Tabs (Prednisone) .Marland KitchenMarland KitchenMarland KitchenMarland Kitchen 3 for 2 days then 2 for 2 days then 1 for 2 days Her updated medication list for this problem includes:    Proair Hfa 108 (90 Base) Mcg/act Aers (Albuterol sulfate) .[name]... 2 puffs every 4 hours as needed    Singulair 10 Mg Tabs (Montelukast sodium) .[name]... 1 tab by mouth at bedtime    Prednisone 20 Mg Tabs (Prednisone) .Marland KitchenMarland KitchenMarland KitchenMarland Kitchen 3 tabs by mouth daily x 3 days, then 2 tabs by mouth daily x 2 days then 1 tab by mouth daily x 2 days  Complete Medication List: 1)  Pantoprazole Sodium 40 Mg Tbec (Pantoprazole sodium) .[name].. 1 tab by mouth daily 2)  Proair Hfa 108 (90 Base) Mcg/act Aers (Albuterol sulfate) .... 2 puffs every 4 hours as needed 3)  Singulair 10 Mg Tabs (Montelukast sodium) .[name].. 1 tab by mouth at bedtime 4)  Fluticasone Propionate 50 Mcg/act Susp (Fluticasone propionate) .... 2 puff in each nostril daily 5)  Zyrtec Allergy 10 Mg Tabs (Cetirizine hcl) .... Take 1 tablet by mouth once a day 6)  Multivitamins Tabs (Multiple vitamin) .... Take 1 tablet by mouth once a day 7)  Calcium Carbonate-vitamin D 600-400 Mg-unit Tabs (Calcium carbonate-vitamin d) .... Take 1 tablet by mouth once a day 8)  Vitamin B-12 500 Mcg Tabs (Cyanocobalamin) .... Take 1 tablet by mouth once a day 9)  Prednisone 20 Mg Tabs (Prednisone) .... 3 tabs by mouth daily x 3 days, then 2 tabs by  mouth daily x 2 days then 1 tab by mouth daily x 2 days   Patient Instructions: 1)  Viral trigger to asthma 2)  Use advil for sore throat. 3)  Use mucinex DM to break up mucus and for cough. 4)  Start prednisone taper. 5)  Use albuterol as needed wheeze. 6)   Call if not improving as expected... call if wheezing not improving. 7)   If severe SOb.[name] go to ER.  Prescriptions: PREDNISONE 20 MG TABS (PREDNISONE) 3 tabs by mouth daily x 3 days, then 2 tabs by mouth daily x 2 days then 1 tab by mouth daily x 2 days  #15 x 0   Entered and Authorized by:   Kerby Nora MD   Signed by:   Kerby Nora MD on 06/20/2010   Method used:   [facility] to  [facility] [phone]* (retail)       [address]       [place], [place]  [address]       Ph: [phone]       Fax: [phone]   RxID:   [id number]    Orders Added: 1)  Est. Patient Level III [33295]    Current Allergies (reviewed today): No known allergies

## 2010-06-30 ENCOUNTER — Encounter: Payer: Self-pay | Admitting: Family Medicine

## 2010-07-18 NOTE — Letter (Signed)
Summary: [facility]   Imported By: Kassie Mends 07/10/2010 10:11:34  _____________________________________________________________________  External Attachment:    Type:   Image     Comment:   External Document

## 2010-07-28 ENCOUNTER — Ambulatory Visit: Payer: Self-pay | Admitting: Family Medicine

## 2010-08-09 ENCOUNTER — Encounter: Payer: Self-pay | Admitting: Family Medicine

## 2010-10-12 ENCOUNTER — Telehealth: Payer: Self-pay | Admitting: *Deleted

## 2010-10-12 NOTE — Telephone Encounter (Signed)
Patient says that she has been swimming a lot lately and now has swimmers ear. She says that she problems with this every summer. She is asking if she can get ear drops called in. [facility].

## 2010-10-13 NOTE — Telephone Encounter (Signed)
Patient advised and she will go to urgent care today

## 2010-10-13 NOTE — Telephone Encounter (Signed)
Needs appt to be seen here or in Sat clinic

## 2010-12-12 ENCOUNTER — Other Ambulatory Visit: Payer: Self-pay | Admitting: Family Medicine

## 2011-05-01 HISTORY — PX: CHOLECYSTECTOMY: SHX55

## 2011-07-18 ENCOUNTER — Ambulatory Visit (INDEPENDENT_AMBULATORY_CARE_PROVIDER_SITE_OTHER): Payer: BC Managed Care – PPO | Admitting: Family Medicine

## 2011-07-18 ENCOUNTER — Encounter: Payer: Self-pay | Admitting: Family Medicine

## 2011-07-18 VITALS — BP 120/70 | HR 85 | Temp 97.9°F | Ht 62.0 in | Wt 203.1 lb

## 2011-07-18 DIAGNOSIS — J45901 Unspecified asthma with (acute) exacerbation: Secondary | ICD-10-CM

## 2011-07-18 DIAGNOSIS — J209 Acute bronchitis, unspecified: Secondary | ICD-10-CM

## 2011-07-18 MED ORDER — DOXYCYCLINE HYCLATE 100 MG PO TABS: 100.0000 mg | ORAL_TABLET | Freq: Two times a day (BID) | ORAL | Status: DC

## 2011-07-18 MED ORDER — ALBUTEROL SULFATE HFA 108 (90 BASE) MCG/ACT IN AERS: 2.0000 | INHALATION_SPRAY | Freq: Four times a day (QID) | RESPIRATORY_TRACT | Status: DC | PRN

## 2011-07-18 NOTE — Progress Notes (Signed)
  Patient Name: Donna Hensley Date of Birth: Aug 30, 1965 Age: 46 y.o. Medical Record Number: [id number] Gender: female Date of Encounter: 07/18/2011  History of Present Illness:  Donna Hensley is a 46 y.o. very pleasant female patient who presents with the following:  Feels terrible, feels like a weight on her chest. None recently. Felt like  Acold, but then has been getting. Feels like heavy.   Acute Bronchitis: Patient presents for presents evaluation of dyspnea, productive cough and sneezing. Symptoms began 2 weeks ago and are gradually worsening since that time.  Past history is significant for asthma.  Out of albuterol  Past Medical History, Surgical History, Social History, Family History, Problem List, Medications, and Allergies have been reviewed and updated if relevant.  Review of Systems: ROS: GEN: Acute illness details above GI: Tolerating PO intake GU: maintaining adequate hydration and urination Pulm: trouble taking a deep breath Interactive and getting along well at home.  Otherwise, ROS is as per the HPI.   Physical Examination: Filed Vitals:   07/18/11 1037  BP: 120/70  Pulse: 85  Temp: 97.9 F (36.6 C)  TempSrc: Oral  Height: 5\' 2"  (1.575 m)  Weight: 203 lb 1.9 oz (92.135 kg)  SpO2: 98%    Body mass index is 37.15 kg/(m^2).   GEN: A and O x 3. WDWN. NAD.    ENT: Nose clear, ext NML.  No LAD.  No JVD.  TM's clear. Oropharynx clear.  PULM: Normal WOB, no distress. Decreased air movement diffusely.  No crackles, wheezes, rhonchi. CV: RRR, no M/G/R, No rubs, No JVD.   EXT: warm and well-perfused, No c/c/e. PSYCH: Pleasant and conversant.   Assessment and Plan:   1. Bronchitis, acute   2. ASTHMA, WITH ACUTE EXACERBATION    Doxycycline and albuterol  Acute bronchitis: discussed plan of care. Given length of symptoms and overall history, will treat with ABX in this case. Continue with additional supportive care, cough medications, liquids,  sleep, steam / vaporizer.  Albuterol prn, mild asthma flare

## 2011-07-24 ENCOUNTER — Telehealth: Payer: Self-pay | Admitting: Family Medicine

## 2011-07-24 NOTE — Telephone Encounter (Signed)
Caller: Chrsitine/Patient is calling with a question about Doxycyline.The medication was written by [facility] at [facility] ~ 1 week ago, for bronchitis, states no change, afebrile, cough worsens, productive;  yellowish in color, still taking the RX, states she vomits the RX each time she takes the RX, even taking with food, has been vomiting since 06/30/11. Guideline: Vomiting, with symptoms after use of ABX RX,  LMP 07/03/11. [facility]. Please call.

## 2011-07-25 MED ORDER — AZITHROMYCIN 250 MG PO TABS: ORAL_TABLET | ORAL | Status: AC

## 2011-07-25 NOTE — Telephone Encounter (Signed)
Call  Doxy can sometimes cause nausea - med SE  Stop it  Call in  Z-pak: 2 tabs po on day 1, then 1 tab po for 4 days

## 2011-07-25 NOTE — Telephone Encounter (Signed)
Patient advised and and medication called [facility]

## 2011-08-02 ENCOUNTER — Ambulatory Visit: Payer: BC Managed Care – PPO | Admitting: Family Medicine

## 2011-08-28 ENCOUNTER — Encounter: Payer: Self-pay | Admitting: Family Medicine

## 2011-08-28 ENCOUNTER — Ambulatory Visit (INDEPENDENT_AMBULATORY_CARE_PROVIDER_SITE_OTHER): Payer: BC Managed Care – PPO | Admitting: Family Medicine

## 2011-08-28 VITALS — BP 130/90 | HR 80 | Temp 97.7°F | Wt 204.8 lb

## 2011-08-28 DIAGNOSIS — R059 Cough, unspecified: Secondary | ICD-10-CM

## 2011-08-28 DIAGNOSIS — R05 Cough: Secondary | ICD-10-CM

## 2011-08-28 MED ORDER — HYDROCODONE-HOMATROPINE 5-1.5 MG/5ML PO SYRP: 5.0000 mL | ORAL_SOLUTION | Freq: Every evening | ORAL | Status: AC | PRN

## 2011-08-28 MED ORDER — PREDNISONE 20 MG PO TABS: ORAL_TABLET | ORAL | Status: DC

## 2011-08-28 NOTE — Progress Notes (Signed)
  Subjective:    Patient ID: Donna Hensley, female    DOB: 03-26-1966, 46 y.o.   MRN: [id number]  HPI CC: cough  6 wk h/o coughing.  Seen here 07/18/2011 with dx bronchitis and mild asthma flare, treated with doxy and albuterol but caused nausea so changed to zpack.  Didn't really improve so [facility] - treated with 5mg  prednisone taper and benzonatate perls and hydrocodone homatropine cough syrup.  Didn't improve, seen again 08/09/2011 and treated with stronger 10mg  prednisone taper and albuterol with spacer, and another zpack.  Started feeling better.  Now over last several days having barking cough, having trouble telling whether just allergy issue or repeat infection.  Mild nasal congestion, coughing mildly productive of phlegm.  Sleeping ok at night time.  Keeps humidifier going.  No longer has cough syrup but this was helping.  No fevers/chills, abd pain, n/v, ear or tooth pain, HA.  No worsening GERD sxs.  Benzonatate doesn't work for her.  No sick contacts at home.  No smokers at home.  + h/o asthma worse with bronchitis and allergic rhinitis.  No chest tightness, wheezing, heaviness.  H/o PNA in past.  Past Medical History  Diagnosis Date  . Allergy   . GERD (gastroesophageal reflux disease)   . Asthma      Review of Systems Per HPI    Objective:   Physical Exam  Vitals reviewed. Constitutional: She appears well-developed and well-nourished. No distress.  HENT:  Head: Normocephalic and atraumatic.  Right Ear: Hearing, tympanic membrane, external ear and ear canal normal.  Left Ear: Hearing, tympanic membrane, external ear and ear canal normal.  Nose: Nose normal. No mucosal edema or rhinorrhea. Right sinus exhibits no maxillary sinus tenderness and no frontal sinus tenderness. Left sinus exhibits no maxillary sinus tenderness and no frontal sinus tenderness.  Mouth/Throat: Uvula is midline, oropharynx is clear and moist and mucous membranes are normal.  No oropharyngeal exudate, posterior oropharyngeal edema, posterior oropharyngeal erythema or tonsillar abscesses.  Eyes: Conjunctivae and EOM are normal. Pupils are equal, round, and reactive to light. No scleral icterus.  Neck: Normal range of motion. Neck supple.  Cardiovascular: Normal rate, regular rhythm, normal heart sounds and intact distal pulses.   No murmur heard. Pulmonary/Chest: Effort normal and breath sounds normal. No respiratory distress. She has no wheezes. She has no rales.       Decreased breath sounds throughout, but ok air movement  Lymphadenopathy:    She has no cervical adenopathy.  Skin: Skin is warm and dry. No rash noted.       Assessment & Plan:

## 2011-08-28 NOTE — Patient Instructions (Signed)
I think you have continued bronchial irritation from recent bouts of bronchitis causing continued asthma flare. Treat with cough syrup for when at home and prednisone longer taper. Use albuterol regularly for next several days. Update [place] if fever >101, worsening productive cough.

## 2011-08-28 NOTE — Assessment & Plan Note (Signed)
Residual cough after recent bouts of bronchitis and asthma exacerbation. Lung exam with somewhat diminished breath sounds throughout but nothing focal.  Not concerned with PNA. Anticipate continued asthma flare, treat with prolonged steroid course. Also recommended regular albuterol use for next week. Update [place] if sxs not improved. consider controller asthma medication while getting over bronchitis if not better after above treatment. No need for continued abx.

## 2012-08-19 ENCOUNTER — Ambulatory Visit (INDEPENDENT_AMBULATORY_CARE_PROVIDER_SITE_OTHER): Payer: BC Managed Care – PPO | Admitting: Internal Medicine

## 2012-08-19 ENCOUNTER — Encounter: Payer: Self-pay | Admitting: Internal Medicine

## 2012-08-19 VITALS — BP 150/96 | HR 87 | Temp 98.1°F

## 2012-08-19 DIAGNOSIS — Z13220 Encounter for screening for lipoid disorders: Secondary | ICD-10-CM

## 2012-08-19 DIAGNOSIS — O99810 Abnormal glucose complicating pregnancy: Secondary | ICD-10-CM

## 2012-08-19 DIAGNOSIS — J45909 Unspecified asthma, uncomplicated: Secondary | ICD-10-CM

## 2012-08-19 DIAGNOSIS — J309 Allergic rhinitis, unspecified: Secondary | ICD-10-CM

## 2012-08-19 DIAGNOSIS — O24419 Gestational diabetes mellitus in pregnancy, unspecified control: Secondary | ICD-10-CM

## 2012-08-19 DIAGNOSIS — K219 Gastro-esophageal reflux disease without esophagitis: Secondary | ICD-10-CM

## 2012-08-19 DIAGNOSIS — E0590 Thyrotoxicosis, unspecified without thyrotoxic crisis or storm: Secondary | ICD-10-CM

## 2012-08-19 DIAGNOSIS — Z1239 Encounter for other screening for malignant neoplasm of breast: Secondary | ICD-10-CM

## 2012-08-25 ENCOUNTER — Encounter: Payer: Self-pay | Admitting: Internal Medicine

## 2012-08-25 MED ORDER — OMEPRAZOLE 40 MG PO CPDR: 40.0000 mg | DELAYED_RELEASE_CAPSULE | Freq: Every day | ORAL | Status: DC

## 2012-08-25 MED ORDER — MONTELUKAST SODIUM 10 MG PO TABS: 10.0000 mg | ORAL_TABLET | Freq: Every day | ORAL | Status: DC

## 2012-08-25 MED ORDER — FLUTICASONE PROPIONATE 50 MCG/ACT NA SUSP: 2.0000 | Freq: Every day | NASAL | Status: DC

## 2012-08-25 NOTE — Assessment & Plan Note (Signed)
Check fasting glucose.  Follow.   

## 2012-08-25 NOTE — Assessment & Plan Note (Signed)
Breathing stable.  Uses an inhaler intermittently.  Follow.

## 2012-08-25 NOTE — Assessment & Plan Note (Signed)
Stable.  Follow.   

## 2012-08-25 NOTE — Assessment & Plan Note (Signed)
Documented in records.  Pt did not report diagnosis to me.  Check tsh.

## 2012-08-25 NOTE — Assessment & Plan Note (Signed)
Symptoms controlled on omeprazole.  Follow.  

## 2012-08-25 NOTE — Progress Notes (Signed)
Subjective:    Patient ID: Donna Hensley, female    DOB: 27-Sep-1965, 47 y.o.   MRN: [id number]  HPI 47 year old female with past history of GERD, seasonal allergies and asthma who comes in today to follow up on these issues as well as to establish care.  Arvid Right daughter).  Previously a patient of Dr Ermalene Searing.  States she has been doing relatively well.  Has issues with her allergies and asthma - seasonally.  Uses an inhaler as needed.  Breathing stable currently.  No cardiac symptoms with increased activity or exertion.  No acid reflux.  Takes omeprazole. Controls.   Bowels stable.  Has regular periods.  Last for three days.  No spotting in between.  Husband has had a vasectomy.  Denies possibility of being pregnant.  Had gestational diabetes.  Recently had her gallbladder removed.  While in the hospital, blood pressure elevated.  Leveled off.     Past Medical History  Diagnosis Date  . Allergy   . GERD (gastroesophageal reflux disease)   . Asthma   . Gestational diabetes     Current Outpatient Prescriptions on File Prior to Visit  Medication Sig Dispense Refill  . cetirizine (ZYRTEC) 10 MG tablet Take 10 mg by mouth daily.      . fluticasone (FLONASE) 50 MCG/ACT nasal spray Place 2 sprays into the nose daily.      . Multiple Vitamin (MULTIVITAMIN) tablet Take 1 tablet by mouth daily.      [name] omeprazole (PRILOSEC) 40 MG capsule Take 40 mg by mouth daily.      [name] SINGULAIR 10 MG tablet TAKE 1 TABLET BY MOUTH AT BEDTIME  90 tablet  2  . albuterol (PROVENTIL HFA;VENTOLIN HFA) 108 (90 BASE) MCG/ACT inhaler Inhale 2 puffs into the lungs every 6 (six) hours as needed for wheezing.  1 Inhaler  3   No current facility-administered medications on file prior to visit.    Review of Systems Patient denies any headache, lightheadedness or dizziness.  No sinus or allergy symptoms currently.  Flares intermittently.  No chest pain, tightness or palpitations.  No increased shortness of breath,  cough or congestion.  No nausea or vomiting.  No acid reflux.  Omeprazole controls.  No abdominal pain or cramping.  No bowel change, such as diarrhea, constipation, BRBPR or melana.  No urine change.  Periods regular.         Objective:   Physical Exam Filed Vitals:   08/19/12 1005  BP: 150/96  Pulse: 87  Temp: 98.1 F (36.7 C)   Blood pressure recheck:  68/6  47 year old female in no acute distress.   HEENT:  Nares- clear.  Oropharynx - without lesions. NECK:  Supple.  Nontender.  No audible bruit.  HEART:  Appears to be regular. LUNGS:  No crackles or wheezing audible.  Respirations even and unlabored.  RADIAL PULSE:  Equal bilaterally.  ABDOMEN:  Soft, nontender.  Bowel sounds present and normal.  No audible abdominal bruit.  EXTREMITIES:  No increased edema present.  DP pulses palpable and equal bilaterally.          Assessment & Plan:  ELEVATED BLOOD PRESSURE.  Will have her spot check her pressure.  Get her back in soon to reassess.  Follow.  If persistent elevation, will require medicaiton.   HEALTH MAINTENANCE.  Schedule her for a physical next visit.  Schedule mammogram when due.   I spent 45 minutes with the patient and more than  50% of the visit was spent in consultation regarding the above.

## 2012-09-04 ENCOUNTER — Other Ambulatory Visit (INDEPENDENT_AMBULATORY_CARE_PROVIDER_SITE_OTHER): Payer: BC Managed Care – PPO

## 2012-09-04 DIAGNOSIS — O24419 Gestational diabetes mellitus in pregnancy, unspecified control: Secondary | ICD-10-CM

## 2012-09-04 DIAGNOSIS — O99810 Abnormal glucose complicating pregnancy: Secondary | ICD-10-CM

## 2012-09-04 DIAGNOSIS — Z13220 Encounter for screening for lipoid disorders: Secondary | ICD-10-CM

## 2012-09-04 LAB — CBC WITH DIFFERENTIAL/PLATELET
Basophils Absolute: 0.1 10*3/uL (ref 0.0–0.1)
Basophils Relative: 0.5 % (ref 0.0–3.0)
Eosinophils Absolute: 0.3 10*3/uL (ref 0.0–0.7)
Eosinophils Relative: 2.4 % (ref 0.0–5.0)
HCT: 40.8 % (ref 36.0–46.0)
Hemoglobin: 13.4 g/dL (ref 12.0–15.0)
Lymphocytes Relative: 25.2 % (ref 12.0–46.0)
Lymphs Abs: 2.7 10*3/uL (ref 0.7–4.0)
MCHC: 32.9 g/dL (ref 30.0–36.0)
MCV: 77.2 fl — ABNORMAL LOW (ref 78.0–100.0)
Monocytes Absolute: 0.6 10*3/uL (ref 0.1–1.0)
Monocytes Relative: 5.9 % (ref 3.0–12.0)
Neutro Abs: 7.2 10*3/uL (ref 1.4–7.7)
Neutrophils Relative %: 66 % (ref 43.0–77.0)
Platelets: 281 10*3/uL (ref 150.0–400.0)
RBC: 5.29 Mil/uL — ABNORMAL HIGH (ref 3.87–5.11)
RDW: 17.3 % — ABNORMAL HIGH (ref 11.5–14.6)
WBC: 10.9 10*3/uL — ABNORMAL HIGH (ref 4.5–10.5)

## 2012-09-04 LAB — COMPREHENSIVE METABOLIC PANEL
ALT: 31 U/L (ref 0–35)
AST: 25 U/L (ref 0–37)
Albumin: 3.7 g/dL (ref 3.5–5.2)
Alkaline Phosphatase: 121 U/L — ABNORMAL HIGH (ref 39–117)
BUN: 11 mg/dL (ref 6–23)
CO2: 25 mEq/L (ref 19–32)
Calcium: 9.1 mg/dL (ref 8.4–10.5)
Chloride: 105 mEq/L (ref 96–112)
Creatinine, Ser: 0.9 mg/dL (ref 0.4–1.2)
GFR: 72.16 mL/min (ref >60.00)
Glucose, Bld: 114 mg/dL — ABNORMAL HIGH (ref 70–99)
Potassium: 4.2 mEq/L (ref 3.5–5.1)
Sodium: 137 mEq/L (ref 135–145)
Total Bilirubin: 0.9 mg/dL (ref 0.3–1.2)
Total Protein: 7 g/dL (ref 6.0–8.3)

## 2012-09-04 LAB — LIPID PANEL
Cholesterol: 147 mg/dL (ref 0–200)
HDL: 44.6 mg/dL (ref >39.00)
LDL Cholesterol: 87 mg/dL (ref 0–99)
Total CHOL/HDL Ratio: 3
Triglycerides: 77 mg/dL (ref 0.0–149.0)
VLDL: 15.4 mg/dL (ref 0.0–40.0)

## 2012-09-04 LAB — TSH: TSH: 0.87 u[IU]/mL (ref 0.35–5.50)

## 2012-09-14 ENCOUNTER — Telehealth: Payer: Self-pay | Admitting: Internal Medicine

## 2012-09-14 ENCOUNTER — Encounter: Payer: Self-pay | Admitting: Internal Medicine

## 2012-09-14 NOTE — Telephone Encounter (Signed)
Pt notified of lab results and need for f/u labs 1-2 days before her 10/01/12 appt.  Please schedule her a fasting lab appt 1-2 days before 10/01/12 appt and call her with an appt date and time.  Thanks.

## 2012-09-15 NOTE — Telephone Encounter (Signed)
Left message for pt to call back please schedule labs

## 2012-09-15 NOTE — Telephone Encounter (Signed)
Appointment 6/2

## 2012-09-26 ENCOUNTER — Telehealth: Payer: Self-pay | Admitting: *Deleted

## 2012-09-26 DIAGNOSIS — R739 Hyperglycemia, unspecified: Secondary | ICD-10-CM

## 2012-09-26 DIAGNOSIS — R718 Other abnormality of red blood cells: Secondary | ICD-10-CM

## 2012-09-26 NOTE — Telephone Encounter (Signed)
Pt is coming in Monday 06.02.2014 for labs, what labs and dx code?

## 2012-09-28 NOTE — Telephone Encounter (Signed)
F/u labs ordered.   

## 2012-09-29 ENCOUNTER — Other Ambulatory Visit (INDEPENDENT_AMBULATORY_CARE_PROVIDER_SITE_OTHER): Payer: BC Managed Care – PPO

## 2012-09-29 DIAGNOSIS — R739 Hyperglycemia, unspecified: Secondary | ICD-10-CM

## 2012-09-29 DIAGNOSIS — R7309 Other abnormal glucose: Secondary | ICD-10-CM

## 2012-09-29 DIAGNOSIS — R17 Unspecified jaundice: Secondary | ICD-10-CM

## 2012-09-29 DIAGNOSIS — R718 Other abnormality of red blood cells: Secondary | ICD-10-CM

## 2012-09-29 LAB — CBC WITH DIFFERENTIAL/PLATELET
Basophils Absolute: 0.1 10*3/uL (ref 0.0–0.1)
Basophils Relative: 0.6 % (ref 0.0–3.0)
Eosinophils Absolute: 0.2 10*3/uL (ref 0.0–0.7)
Eosinophils Relative: 2.3 % (ref 0.0–5.0)
HCT: 41.8 % (ref 36.0–46.0)
Hemoglobin: 13.6 g/dL (ref 12.0–15.0)
Lymphocytes Relative: 25.2 % (ref 12.0–46.0)
Lymphs Abs: 2.7 10*3/uL (ref 0.7–4.0)
MCHC: 32.5 g/dL (ref 30.0–36.0)
MCV: 77.8 fl — ABNORMAL LOW (ref 78.0–100.0)
Monocytes Absolute: 0.7 10*3/uL (ref 0.1–1.0)
Monocytes Relative: 6.5 % (ref 3.0–12.0)
Neutro Abs: 6.9 10*3/uL (ref 1.4–7.7)
Neutrophils Relative %: 65.4 % (ref 43.0–77.0)
Platelets: 303 10*3/uL (ref 150.0–400.0)
RBC: 5.36 Mil/uL — ABNORMAL HIGH (ref 3.87–5.11)
RDW: 16.6 % — ABNORMAL HIGH (ref 11.5–14.6)
WBC: 10.5 10*3/uL (ref 4.5–10.5)

## 2012-09-29 LAB — IBC PANEL
Iron: 49 ug/dL (ref 42–145)
Saturation Ratios: 9.1 % — ABNORMAL LOW (ref 20.0–50.0)
Transferrin: 384.6 mg/dL — ABNORMAL HIGH (ref 212.0–360.0)

## 2012-09-29 LAB — HEPATIC FUNCTION PANEL
ALT: 25 U/L (ref 0–35)
AST: 25 U/L (ref 0–37)
Albumin: 3.6 g/dL (ref 3.5–5.2)
Alkaline Phosphatase: 112 U/L (ref 39–117)
Bilirubin, Direct: 0.1 mg/dL (ref 0.0–0.3)
Total Bilirubin: 0.7 mg/dL (ref 0.3–1.2)
Total Protein: 7.2 g/dL (ref 6.0–8.3)

## 2012-09-29 LAB — GLUCOSE, RANDOM: Glucose, Bld: 125 mg/dL — ABNORMAL HIGH (ref 70–99)

## 2012-09-29 LAB — FERRITIN: Ferritin: 6.8 ng/mL — ABNORMAL LOW (ref 10.0–291.0)

## 2012-09-29 LAB — HEMOGLOBIN A1C: Hgb A1c MFr Bld: 6.4 % (ref 4.6–6.5)

## 2012-09-30 ENCOUNTER — Encounter: Payer: Self-pay | Admitting: Internal Medicine

## 2012-10-01 ENCOUNTER — Ambulatory Visit (INDEPENDENT_AMBULATORY_CARE_PROVIDER_SITE_OTHER): Payer: BC Managed Care – PPO | Admitting: Internal Medicine

## 2012-10-01 ENCOUNTER — Encounter: Payer: Self-pay | Admitting: Internal Medicine

## 2012-10-01 VITALS — BP 130/90 | HR 80 | Temp 98.7°F | Ht 62.0 in | Wt 207.0 lb

## 2012-10-01 DIAGNOSIS — J45909 Unspecified asthma, uncomplicated: Secondary | ICD-10-CM

## 2012-10-01 DIAGNOSIS — O24419 Gestational diabetes mellitus in pregnancy, unspecified control: Secondary | ICD-10-CM

## 2012-10-01 DIAGNOSIS — K219 Gastro-esophageal reflux disease without esophagitis: Secondary | ICD-10-CM

## 2012-10-01 DIAGNOSIS — L989 Disorder of the skin and subcutaneous tissue, unspecified: Secondary | ICD-10-CM

## 2012-10-01 DIAGNOSIS — E611 Iron deficiency: Secondary | ICD-10-CM

## 2012-10-01 DIAGNOSIS — D509 Iron deficiency anemia, unspecified: Secondary | ICD-10-CM

## 2012-10-01 DIAGNOSIS — E0590 Thyrotoxicosis, unspecified without thyrotoxic crisis or storm: Secondary | ICD-10-CM

## 2012-10-01 DIAGNOSIS — R739 Hyperglycemia, unspecified: Secondary | ICD-10-CM

## 2012-10-01 DIAGNOSIS — Z124 Encounter for screening for malignant neoplasm of cervix: Secondary | ICD-10-CM

## 2012-10-01 DIAGNOSIS — O99810 Abnormal glucose complicating pregnancy: Secondary | ICD-10-CM

## 2012-10-01 DIAGNOSIS — J309 Allergic rhinitis, unspecified: Secondary | ICD-10-CM

## 2012-10-01 DIAGNOSIS — R7309 Other abnormal glucose: Secondary | ICD-10-CM

## 2012-10-02 ENCOUNTER — Other Ambulatory Visit (HOSPITAL_COMMUNITY)
Admission: RE | Admit: 2012-10-02 | Discharge: 2012-10-02 | Disposition: A | Discharge status: Home / Self Care | Payer: BC Managed Care – PPO | Source: Ambulatory Visit | Attending: Internal Medicine | Admitting: Internal Medicine

## 2012-10-02 DIAGNOSIS — Z1151 Encounter for screening for human papillomavirus (HPV): Secondary | ICD-10-CM | POA: Insufficient documentation

## 2012-10-02 DIAGNOSIS — Z01419 Encounter for gynecological examination (general) (routine) without abnormal findings: Secondary | ICD-10-CM | POA: Insufficient documentation

## 2012-10-05 ENCOUNTER — Encounter: Payer: Self-pay | Admitting: Internal Medicine

## 2012-10-05 DIAGNOSIS — E611 Iron deficiency: Secondary | ICD-10-CM | POA: Insufficient documentation

## 2012-10-05 NOTE — Assessment & Plan Note (Signed)
Stable.  Follow.   

## 2012-10-05 NOTE — Assessment & Plan Note (Signed)
Documented in records.  Pt did not report diagnosis to me.  TSH normal.   

## 2012-10-05 NOTE — Assessment & Plan Note (Signed)
Hemoglobin normal.  Still having periods.  Not heavy.  Start multivitamin with iron.  Follow.

## 2012-10-05 NOTE — Progress Notes (Signed)
Subjective:    Patient ID: Donna Hensley, female    DOB: 01-Jul-1965, 47 y.o.   MRN: [id number]  HPI 47 year old female with past history of GERD, seasonal allergies and asthma who comes in today to follow up on these issues as well as for a complete physical exam.  States she has been doing relatively well.  Has issues with her allergies and asthma - seasonally.  Uses an inhaler as needed.  Breathing stable currently.  No cardiac symptoms with increased activity or exertion.  No acid reflux. Takes omeprazole. Controls.   Bowels stable.  Has regular periods.  Last for three days.  No spotting in between.  Husband has had a vasectomy.  Denies possibility of being pregnant.  Had gestational diabetes.  Recently had her gallbladder removed.  While in the hospital, blood pressure elevated.  Leveled off.  Had lab recently.  A1c 6.4.  Discussed diet adjustment and exercise.  She is agreeable to be referred for classes.  Was also found to have decreased MCV and iron.  No blood in her stool.    Past Medical History  Diagnosis Date  . Allergy   . GERD (gastroesophageal reflux disease)   . Asthma   . Gestational diabetes     Current Outpatient Prescriptions on File Prior to Visit  Medication Sig Dispense Refill  . albuterol (PROVENTIL HFA;VENTOLIN HFA) 108 (90 BASE) MCG/ACT inhaler Inhale 2 puffs into the lungs every 6 (six) hours as needed for wheezing.  1 Inhaler  3  . cetirizine (ZYRTEC) 10 MG tablet Take 10 mg by mouth daily.      . fluticasone (FLONASE) 50 MCG/ACT nasal spray Place 2 sprays into the nose daily.  48 g  1  . montelukast (SINGULAIR) 10 MG tablet Take 1 tablet (10 mg total) by mouth at bedtime.  90 tablet  1  . Multiple Vitamin (MULTIVITAMIN) tablet Take 1 tablet by mouth daily.      [name] omeprazole (PRILOSEC) 40 MG capsule Take 1 capsule (40 mg total) by mouth daily.  90 capsule  1   No current facility-administered medications on file prior to visit.    Review of Systems Patient  denies any headache, lightheadedness or dizziness.  No sinus or allergy symptoms currently.  Flares intermittently.  No chest pain, tightness or palpitations.  No increased shortness of breath, cough or congestion.  No nausea or vomiting.  No acid reflux.  Omeprazole controls.  No abdominal pain or cramping.  No bowel change, such as diarrhea, constipation, BRBPR or melana.  No urine change.  Periods regular.  Last only three days.        Objective:   Physical Exam  Filed Vitals:   10/01/12 0818  BP: 130/90  Pulse: 80  Temp: 98.7 F (28.79 C)   47 year old female in no acute distress.   HEENT:  Nares- clear.  Oropharynx - without lesions. NECK:  Supple.  Nontender.  No audible bruit.  HEART:  Appears to be regular. LUNGS:  No crackles or wheezing audible.  Respirations even and unlabored.  RADIAL PULSE:  Equal bilaterally.    BREASTS:  No nipple discharge or nipple retraction present.  Could not appreciate any distinct nodules or axillary adenopathy.  ABDOMEN:  Soft, nontender.  Bowel sounds present and normal.  No audible abdominal bruit.  GU:  Normal external genitalia.  Vaginal vault without lesions.  Cervix identified.  Pap performed. Could not appreciate any adnexal masses or tenderness.  RECTAL:  Heme negative.   EXTREMITIES:  No increased edema present.  DP pulses palpable and equal bilaterally.          Assessment & Plan:  ELEVATED BLOOD PRESSURE.  Will have her spot check her pressure.  Get her back in soon to reassess.  Follow.  If persistent elevation, will require medicaiton.   HEALTH MAINTENANCE.  Physical today.  Mammogram scheduled for next week. Pelvic/pap today.

## 2012-10-05 NOTE — Assessment & Plan Note (Signed)
Recent a1c 6.4.  Low carb diet and exercise.  Weight loss.  Will refer for diet adjustment and diabetes education.  Pt in agreement.

## 2012-10-05 NOTE — Assessment & Plan Note (Signed)
Symptoms controlled on omeprazole.  Follow.  

## 2012-10-05 NOTE — Assessment & Plan Note (Signed)
Breathing stable.  Follow.    

## 2012-10-06 ENCOUNTER — Encounter: Payer: Self-pay | Admitting: Internal Medicine

## 2012-10-09 ENCOUNTER — Ambulatory Visit: Payer: Self-pay | Admitting: Internal Medicine

## 2012-11-13 ENCOUNTER — Encounter: Payer: Self-pay | Admitting: Internal Medicine

## 2012-11-13 ENCOUNTER — Ambulatory Visit (INDEPENDENT_AMBULATORY_CARE_PROVIDER_SITE_OTHER): Payer: BC Managed Care – PPO | Admitting: Internal Medicine

## 2012-11-13 VITALS — BP 130/90 | HR 82 | Temp 98.6°F | Ht 62.0 in | Wt 207.8 lb

## 2012-11-13 DIAGNOSIS — J45909 Unspecified asthma, uncomplicated: Secondary | ICD-10-CM

## 2012-11-13 DIAGNOSIS — J309 Allergic rhinitis, unspecified: Secondary | ICD-10-CM

## 2012-11-13 DIAGNOSIS — K219 Gastro-esophageal reflux disease without esophagitis: Secondary | ICD-10-CM

## 2012-11-13 DIAGNOSIS — I10 Essential (primary) hypertension: Secondary | ICD-10-CM

## 2012-11-13 DIAGNOSIS — O24419 Gestational diabetes mellitus in pregnancy, unspecified control: Secondary | ICD-10-CM

## 2012-11-13 DIAGNOSIS — O99810 Abnormal glucose complicating pregnancy: Secondary | ICD-10-CM

## 2012-11-13 MED ORDER — HYDROCHLOROTHIAZIDE 25 MG PO TABS: ORAL_TABLET | ORAL | Status: DC

## 2012-11-15 ENCOUNTER — Encounter: Payer: Self-pay | Admitting: Internal Medicine

## 2012-11-15 DIAGNOSIS — I10 Essential (primary) hypertension: Secondary | ICD-10-CM | POA: Insufficient documentation

## 2012-11-15 NOTE — Assessment & Plan Note (Signed)
Stable.  Follow.   

## 2012-11-15 NOTE — Assessment & Plan Note (Signed)
Symptoms controlled on omeprazole.  Follow.  

## 2012-11-15 NOTE — Assessment & Plan Note (Signed)
Breathing stable.  Follow.    

## 2012-11-15 NOTE — Progress Notes (Signed)
Subjective:    Patient ID: Donna Hensley, female    DOB: Apr 24, 1966, 46 y.o.   MRN: [id number]  HPI 48 year old female with past history of GERD, seasonal allergies and asthma who comes in today for a scheduled follow up.  States she has been doing relatively well.  Has issues with her allergies and asthma - seasonally.  Uses an inhaler as needed.  Breathing stable currently.  No cardiac symptoms with increased activity or exertion.  No acid reflux. Takes omeprazole. Controls.   Recently had her gallbladder removed.  While in the hospital, blood pressure elevated.  Had lab recently.  A1c 6.4.  Discussed diet adjustment and exercise.  She is agreeable to be referred for classes.  Planning for classes soon.  Blood pressure remaining elevated.     Past Medical History  Diagnosis Date  . Allergy   . GERD (gastroesophageal reflux disease)   . Asthma   . Gestational diabetes     Current Outpatient Prescriptions on File Prior to Visit  Medication Sig Dispense Refill  . cetirizine (ZYRTEC) 10 MG tablet Take 10 mg by mouth daily.      . fluticasone (FLONASE) 50 MCG/ACT nasal spray Place 2 sprays into the nose daily.  48 g  1  . montelukast (SINGULAIR) 10 MG tablet Take 1 tablet (10 mg total) by mouth at bedtime.  90 tablet  1  . Multiple Vitamin (MULTIVITAMIN) tablet Take 1 tablet by mouth daily.      [name] omeprazole (PRILOSEC) 40 MG capsule Take 1 capsule (40 mg total) by mouth daily.  90 capsule  1  . albuterol (PROVENTIL HFA;VENTOLIN HFA) 108 (90 BASE) MCG/ACT inhaler Inhale 2 puffs into the lungs every 6 (six) hours as needed for wheezing.  1 Inhaler  3   No current facility-administered medications on file prior to visit.    Review of Systems Patient denies any headache, lightheadedness or dizziness.  No sinus or allergy symptoms currently.  Flares intermittently.  No chest pain, tightness or palpitations.  No increased shortness of breath, cough or congestion.  No nausea or vomiting.  No  acid reflux.  Omeprazole controls.  No abdominal pain or cramping.  No bowel change, such as diarrhea, constipation, BRBPR or melana.  No urine change.  Periods regular.  Has been having neck and upper back pain and stiffness.  Massage therapy improved symptoms.  Blood pressure is remaining elevated.       Objective:   Physical Exam  Filed Vitals:   11/13/12 0822  BP: 130/90  Pulse: 82  Temp: 98.6 F (37 C)   Blood pressure recheck:  18/8 47 year old female in no acute distress.   HEENT:  Nares- clear.  Oropharynx - without lesions. NECK:  Supple.  Nontender.  No audible bruit.  HEART:  Appears to be regular. LUNGS:  No crackles or wheezing audible.  Respirations even and unlabored.  RADIAL PULSE:  Equal bilaterally.  ABDOMEN:  Soft, nontender.  Bowel sounds present and normal.  No audible abdominal bruit.  EXTREMITIES:  No increased edema present.  DP pulses palpable and equal bilaterally.          Assessment & Plan:  ELEVATED BLOOD PRESSURE.  Continued elevated blood pressure.  Start HCTZ 25mg  (1/2 tab) q day.  Have her spot check her pressure and get her back in soon to reassess.  Adjust medication as needed.    HEALTH MAINTENANCE.  Physical last visit.  Mammogram 10/09/12.

## 2012-11-15 NOTE — Assessment & Plan Note (Signed)
Blood pressure elevated.  Start HCTZ 25mg  1/2 tablet q day.  Follow pressures.  Get her back in soon to reassess.

## 2012-11-15 NOTE — Assessment & Plan Note (Signed)
Recent a1c 6.4.  Low carb diet and exercise.  Weight loss.  Was referred for diet adjustment and diabetes education.

## 2012-11-16 ENCOUNTER — Encounter: Payer: Self-pay | Admitting: Internal Medicine

## 2012-11-19 ENCOUNTER — Encounter: Payer: Self-pay | Admitting: Internal Medicine

## 2012-11-19 ENCOUNTER — Encounter: Payer: Self-pay | Admitting: *Deleted

## 2012-11-26 ENCOUNTER — Encounter: Payer: Self-pay | Admitting: Internal Medicine

## 2012-12-19 ENCOUNTER — Ambulatory Visit: Payer: BC Managed Care – PPO | Admitting: Internal Medicine

## 2013-01-01 ENCOUNTER — Encounter: Payer: Self-pay | Admitting: Internal Medicine

## 2013-01-01 ENCOUNTER — Ambulatory Visit (INDEPENDENT_AMBULATORY_CARE_PROVIDER_SITE_OTHER): Payer: BC Managed Care – PPO | Admitting: Internal Medicine

## 2013-01-01 VITALS — BP 130/100 | HR 84 | Temp 98.5°F | Ht 62.0 in | Wt 208.2 lb

## 2013-01-01 DIAGNOSIS — K219 Gastro-esophageal reflux disease without esophagitis: Secondary | ICD-10-CM

## 2013-01-01 DIAGNOSIS — R05 Cough: Secondary | ICD-10-CM

## 2013-01-01 DIAGNOSIS — I10 Essential (primary) hypertension: Secondary | ICD-10-CM

## 2013-01-01 DIAGNOSIS — D509 Iron deficiency anemia, unspecified: Secondary | ICD-10-CM

## 2013-01-01 DIAGNOSIS — E0590 Thyrotoxicosis, unspecified without thyrotoxic crisis or storm: Secondary | ICD-10-CM

## 2013-01-01 DIAGNOSIS — J309 Allergic rhinitis, unspecified: Secondary | ICD-10-CM

## 2013-01-01 DIAGNOSIS — J45909 Unspecified asthma, uncomplicated: Secondary | ICD-10-CM

## 2013-01-01 DIAGNOSIS — E611 Iron deficiency: Secondary | ICD-10-CM

## 2013-01-01 DIAGNOSIS — O99810 Abnormal glucose complicating pregnancy: Secondary | ICD-10-CM

## 2013-01-01 DIAGNOSIS — O24419 Gestational diabetes mellitus in pregnancy, unspecified control: Secondary | ICD-10-CM

## 2013-01-01 DIAGNOSIS — R059 Cough, unspecified: Secondary | ICD-10-CM

## 2013-01-01 MED ORDER — LISINOPRIL-HYDROCHLOROTHIAZIDE 10-12.5 MG PO TABS: 1.0000 | ORAL_TABLET | Freq: Every day | ORAL | Status: DC

## 2013-01-04 ENCOUNTER — Encounter: Payer: Self-pay | Admitting: Internal Medicine

## 2013-01-04 NOTE — Progress Notes (Signed)
  Subjective:    Patient ID: Donna Hensley, female    DOB: Jan 07, 1966, 47 y.o.   MRN: [id number]  HPI 47 year old female with past history of GERD, seasonal allergies and asthma who comes in today for a scheduled follow up.  States she has been doing relatively well.  Has issues with her allergies and asthma - seasonally.  Uses an inhaler as needed.  Breathing stable currently.  No cardiac symptoms with increased activity or exertion.  No acid reflux. Takes omeprazole. Controls.   Recently had her gallbladder removed.  While in the hospital, blood pressure elevated.  Had lab recently.  A1c 6.4.  Discussed diet adjustment and exercise.  She is agreeable to be referred for classes.  They have apparently rescheduled her classes.  She has not been yet.  She plans to start Weight Watchers.  Started HCTZ 25mg  1/2 tablet - last visit.  Blood pressure still elevated.     Past Medical History  Diagnosis Date  . Allergy   . GERD (gastroesophageal reflux disease)   . Asthma   . Gestational diabetes     Current Outpatient Prescriptions on File Prior to Visit  Medication Sig Dispense Refill  . cetirizine (ZYRTEC) 10 MG tablet Take 10 mg by mouth daily.      . fluticasone (FLONASE) 50 MCG/ACT nasal spray Place 2 sprays into the nose daily.  48 g  1  . montelukast (SINGULAIR) 10 MG tablet Take 1 tablet (10 mg total) by mouth at bedtime.  90 tablet  1  . Multiple Vitamin (MULTIVITAMIN) tablet Take 1 tablet by mouth daily.      [name] omeprazole (PRILOSEC) 40 MG capsule Take 1 capsule (40 mg total) by mouth daily.  90 capsule  1  . albuterol (PROVENTIL HFA;VENTOLIN HFA) 108 (90 BASE) MCG/ACT inhaler Inhale 2 puffs into the lungs every 6 (six) hours as needed for wheezing.  1 Inhaler  3   No current facility-administered medications on file prior to visit.    Review of Systems Patient denies any headache, lightheadedness or dizziness.  No sinus or allergy symptoms currently.  Flares intermittently.  No chest  pain, tightness or palpitations.  No increased shortness of breath, cough or congestion.  No nausea or vomiting.  No acid reflux.  Omeprazole controls.  No abdominal pain or cramping.  No bowel change, such as diarrhea, constipation, BRBPR or melana.  No urine change.  Periods regular.  Blood pressure is remaining elevated.  Tolerating the hctz.       Objective:   Physical Exam  Filed Vitals:   01/01/13 1522  BP: 130/100  Pulse: 84  Temp: 98.5 F (36.9 C)   Blood pressure recheck:  138/90, pulse 39  47 year old female in no acute distress.   HEENT:  Nares- clear.  Oropharynx - without lesions. NECK:  Supple.  Nontender.  No audible bruit.  HEART:  Appears to be regular. LUNGS:  No crackles or wheezing audible.  Respirations even and unlabored.  RADIAL PULSE:  Equal bilaterally.  ABDOMEN:  Soft, nontender.  Bowel sounds present and normal.  No audible abdominal bruit.  EXTREMITIES:  No increased edema present.  DP pulses palpable and equal bilaterally.          Assessment & Plan:  HEALTH MAINTENANCE.  Physical 10/01/12.  Mammogram 10/09/12.

## 2013-01-04 NOTE — Assessment & Plan Note (Signed)
Breathing stable.  Follow.    

## 2013-01-04 NOTE — Assessment & Plan Note (Signed)
Stable.  Follow.   

## 2013-01-04 NOTE — Assessment & Plan Note (Signed)
Blood pressure still elevated.  On HCTZ 25mg  1/2 tablet q day.  Change to lisinopril/hctz 10/12.5 q day.  Follow pressures.  Get her back in soon to reassess.  Check metabolic panel within the next 2 weeks.

## 2013-01-04 NOTE — Assessment & Plan Note (Signed)
Documented in records.  Pt did not report diagnosis to me.  TSH normal.   

## 2013-01-04 NOTE — Assessment & Plan Note (Signed)
Symptoms controlled on omeprazole.  Follow.  

## 2013-01-04 NOTE — Assessment & Plan Note (Signed)
Hemoglobin normal.  Still having periods.  Not heavy.  On multivitamin with iron.  Follow.   

## 2013-01-04 NOTE — Assessment & Plan Note (Signed)
Has a history of gestational diabetes.  Now with hyperglycemia.  Recent a1c 6.4.  Low carb diet and exercise.  Weight loss.  Was referred for diet adjustment and diabetes education.  Has not been able to go to the classes.  They have rescheduled.  Plans to do weight watchers.  She will notify me if she changes her mind regarding the classes.

## 2013-01-15 ENCOUNTER — Other Ambulatory Visit (INDEPENDENT_AMBULATORY_CARE_PROVIDER_SITE_OTHER): Payer: BC Managed Care – PPO

## 2013-01-15 ENCOUNTER — Encounter: Payer: Self-pay | Admitting: Internal Medicine

## 2013-01-15 DIAGNOSIS — I10 Essential (primary) hypertension: Secondary | ICD-10-CM

## 2013-01-15 LAB — BASIC METABOLIC PANEL
BUN: 13 mg/dL (ref 6–23)
CO2: 27 mEq/L (ref 19–32)
Calcium: 9.2 mg/dL (ref 8.4–10.5)
Chloride: 101 mEq/L (ref 96–112)
Creatinine, Ser: 1 mg/dL (ref 0.4–1.2)
GFR: 66.87 mL/min (ref >60.00)
Glucose, Bld: 188 mg/dL — ABNORMAL HIGH (ref 70–99)
Potassium: 4 mEq/L (ref 3.5–5.1)
Sodium: 136 mEq/L (ref 135–145)

## 2013-01-16 ENCOUNTER — Encounter: Payer: Self-pay | Admitting: Internal Medicine

## 2013-02-03 ENCOUNTER — Ambulatory Visit: Payer: BC Managed Care – PPO | Admitting: Internal Medicine

## 2013-02-05 ENCOUNTER — Other Ambulatory Visit: Payer: Self-pay | Admitting: Internal Medicine

## 2013-02-26 ENCOUNTER — Ambulatory Visit (INDEPENDENT_AMBULATORY_CARE_PROVIDER_SITE_OTHER): Payer: BC Managed Care – PPO | Admitting: Internal Medicine

## 2013-02-26 ENCOUNTER — Encounter: Payer: Self-pay | Admitting: Internal Medicine

## 2013-02-26 VITALS — BP 100/70 | HR 83 | Temp 98.6°F | Ht 62.0 in | Wt 206.5 lb

## 2013-02-26 DIAGNOSIS — Z23 Encounter for immunization: Secondary | ICD-10-CM

## 2013-02-26 DIAGNOSIS — K219 Gastro-esophageal reflux disease without esophagitis: Secondary | ICD-10-CM

## 2013-02-26 DIAGNOSIS — O24419 Gestational diabetes mellitus in pregnancy, unspecified control: Secondary | ICD-10-CM

## 2013-02-26 DIAGNOSIS — E611 Iron deficiency: Secondary | ICD-10-CM

## 2013-02-26 DIAGNOSIS — I10 Essential (primary) hypertension: Secondary | ICD-10-CM

## 2013-02-26 DIAGNOSIS — J309 Allergic rhinitis, unspecified: Secondary | ICD-10-CM

## 2013-02-26 DIAGNOSIS — O99810 Abnormal glucose complicating pregnancy: Secondary | ICD-10-CM

## 2013-02-26 DIAGNOSIS — J45909 Unspecified asthma, uncomplicated: Secondary | ICD-10-CM

## 2013-02-26 DIAGNOSIS — D509 Iron deficiency anemia, unspecified: Secondary | ICD-10-CM

## 2013-02-26 MED ORDER — LISINOPRIL-HYDROCHLOROTHIAZIDE 10-12.5 MG PO TABS: 1.0000 | ORAL_TABLET | Freq: Every day | ORAL | Status: DC

## 2013-03-01 ENCOUNTER — Encounter: Payer: Self-pay | Admitting: Internal Medicine

## 2013-03-01 NOTE — Assessment & Plan Note (Signed)
Breathing stable.  Follow.    

## 2013-03-01 NOTE — Assessment & Plan Note (Signed)
Blood pressure doing better on lisinopril/hctz 10/12.5 q day.  Follow pressures.    

## 2013-03-01 NOTE — Assessment & Plan Note (Signed)
Symptoms controlled on omeprazole.  Follow.  

## 2013-03-01 NOTE — Assessment & Plan Note (Signed)
Stable.  Follow.   

## 2013-03-01 NOTE — Assessment & Plan Note (Signed)
Has a history of gestational diabetes.  Now with hyperglycemia.  Recent a1c 6.4.  Low carb diet and exercise.  Weight loss.  Was referred for diet adjustment and diabetes education.  Has not been able to go to the classes.  They have rescheduled.  Plans to do weight watchers.  She will notify me if she changes her mind regarding the classes.

## 2013-03-01 NOTE — Progress Notes (Signed)
  Subjective:    Patient ID: Donna Hensley, female    DOB: [birth year], 47 y.o.   MRN: [id number]  HPI 47 year old female with past history of GERD, seasonal allergies and asthma who comes in today for a scheduled follow up.  States she has been doing relatively well.  Has issues with her allergies and asthma - seasonally.  Uses an inhaler as needed.  Breathing stable currently.  No cardiac symptoms with increased activity or exertion.  No acid reflux. Takes omeprazole.  Controls.  Recently had her gallbladder removed.  While in the hospital, blood pressure elevated.  Had lab recently.  A1c 6.4.  Have discussed diet adjustment and exercise.  Started lisinopril/hctz last visit.  Blood pressure better.  Some dry eyes.  Using restasis.      Past Medical History  Diagnosis Date  . Allergy   . GERD (gastroesophageal reflux disease)   . Asthma   . Gestational diabetes     Current Outpatient Prescriptions on File Prior to Visit  Medication Sig Dispense Refill  . albuterol (PROVENTIL HFA;VENTOLIN HFA) 108 (90 BASE) MCG/ACT inhaler Inhale 2 puffs into the lungs every 6 (six) hours as needed for wheezing.  1 Inhaler  3  . cetirizine (ZYRTEC) 10 MG tablet Take 10 mg by mouth daily.      . fluticasone (FLONASE) 50 MCG/ACT nasal spray USE 2 SPRAYS NASALLY DAILY  48 g  1  . montelukast (SINGULAIR) 10 MG tablet TAKE 1 TABLET DAILY AT BEDTIME  90 tablet  1  . Multiple Vitamin (MULTIVITAMIN) tablet Take 1 tablet by mouth daily.      [name] omeprazole (PRILOSEC) 40 MG capsule TAKE 1 CAPSULE (40 MG) DAILY  90 capsule  1   No current facility-administered medications on file prior to visit.    Review of Systems Patient denies any headache, lightheadedness or dizziness.  No sinus or allergy symptoms currently.  Flares intermittently.  No chest pain, tightness or palpitations.  No increased shortness of breath, cough or congestion.  No nausea or vomiting.  No acid reflux.  Omeprazole controls.  No abdominal pain  or cramping.  No bowel change, such as diarrhea, constipation, BRBPR or melana.  No urine change.  Periods regular.  Blood pressure better on lisinopril/hctz.  Dry eyes.        Objective:   Physical Exam  Filed Vitals:   02/26/13 0921  BP: 100/70  Pulse: 83  Temp: 98.6 F (37 C)   Blood pressure recheck:  58/35  47 year old female in no acute distress.   HEENT:  Nares- clear.  Oropharynx - without lesions. NECK:  Supple.  Nontender.  No audible bruit.  HEART:  Appears to be regular. LUNGS:  No crackles or wheezing audible.  Respirations even and unlabored.  RADIAL PULSE:  Equal bilaterally.  ABDOMEN:  Soft, nontender.  Bowel sounds present and normal.  No audible abdominal bruit.  EXTREMITIES:  No increased edema present.  DP pulses palpable and equal bilaterally.          Assessment & Plan:  HEALTH MAINTENANCE.  Physical 10/01/12.  Mammogram 10/09/12.

## 2013-03-01 NOTE — Assessment & Plan Note (Signed)
Hemoglobin normal.  Still having periods.  Not heavy.  On multivitamin with iron.  Follow.   

## 2013-04-28 ENCOUNTER — Encounter: Payer: Self-pay | Admitting: Internal Medicine

## 2013-04-29 MED ORDER — FLUTICASONE PROPIONATE HFA 110 MCG/ACT IN AERO: 2.0000 | INHALATION_SPRAY | Freq: Two times a day (BID) | RESPIRATORY_TRACT | Status: DC

## 2013-04-29 NOTE — Telephone Encounter (Signed)
Spoke to pt.  Family has been sick as well.  Just persistent cough.  Does not feel bad.  Using her rescue inhaler and taking singulair.  Has used flovent in the past.  Sent in rx for [facility]ent to [facility] and [facility] [place]  Pt aware.  Will be evaluated if symptoms worsen or do not resolve.

## 2013-05-06 ENCOUNTER — Encounter: Payer: Self-pay | Admitting: Internal Medicine

## 2013-05-06 MED ORDER — BUDESONIDE 90 MCG/ACT IN AEPB: 1.0000 | INHALATION_SPRAY | Freq: Two times a day (BID) | RESPIRATORY_TRACT | Status: DC

## 2013-05-06 NOTE — Telephone Encounter (Signed)
rx sent in for pulmicort.  My chart message sent

## 2013-06-28 ENCOUNTER — Encounter: Payer: Self-pay | Admitting: Internal Medicine

## 2013-06-30 ENCOUNTER — Ambulatory Visit: Payer: BC Managed Care – PPO | Admitting: Internal Medicine

## 2013-07-16 ENCOUNTER — Other Ambulatory Visit: Payer: Self-pay | Admitting: Internal Medicine

## 2013-07-17 ENCOUNTER — Other Ambulatory Visit: Payer: Self-pay | Admitting: Internal Medicine

## 2013-07-17 NOTE — Telephone Encounter (Signed)
Appt 07/23/13

## 2013-07-23 ENCOUNTER — Encounter: Payer: Self-pay | Admitting: Internal Medicine

## 2013-07-23 ENCOUNTER — Ambulatory Visit (INDEPENDENT_AMBULATORY_CARE_PROVIDER_SITE_OTHER): Payer: BC Managed Care – PPO | Admitting: Internal Medicine

## 2013-07-23 VITALS — BP 110/80 | HR 83 | Temp 98.2°F | Ht 62.0 in | Wt 206.0 lb

## 2013-07-23 DIAGNOSIS — J309 Allergic rhinitis, unspecified: Secondary | ICD-10-CM

## 2013-07-23 DIAGNOSIS — E611 Iron deficiency: Secondary | ICD-10-CM

## 2013-07-23 DIAGNOSIS — K219 Gastro-esophageal reflux disease without esophagitis: Secondary | ICD-10-CM

## 2013-07-23 DIAGNOSIS — I10 Essential (primary) hypertension: Secondary | ICD-10-CM

## 2013-07-23 DIAGNOSIS — R739 Hyperglycemia, unspecified: Secondary | ICD-10-CM

## 2013-07-23 DIAGNOSIS — D509 Iron deficiency anemia, unspecified: Secondary | ICD-10-CM

## 2013-07-23 DIAGNOSIS — R7309 Other abnormal glucose: Secondary | ICD-10-CM

## 2013-07-23 DIAGNOSIS — J45909 Unspecified asthma, uncomplicated: Secondary | ICD-10-CM

## 2013-07-23 DIAGNOSIS — O24419 Gestational diabetes mellitus in pregnancy, unspecified control: Secondary | ICD-10-CM

## 2013-07-23 DIAGNOSIS — E0590 Thyrotoxicosis, unspecified without thyrotoxic crisis or storm: Secondary | ICD-10-CM

## 2013-07-23 DIAGNOSIS — O99810 Abnormal glucose complicating pregnancy: Secondary | ICD-10-CM

## 2013-07-23 MED ORDER — FLUTICASONE PROPIONATE HFA 110 MCG/ACT IN AERO: 2.0000 | INHALATION_SPRAY | Freq: Two times a day (BID) | RESPIRATORY_TRACT | Status: DC

## 2013-07-23 MED ORDER — BUDESONIDE 90 MCG/ACT IN AEPB: 2.0000 | INHALATION_SPRAY | Freq: Two times a day (BID) | RESPIRATORY_TRACT | Status: DC

## 2013-07-23 NOTE — Progress Notes (Signed)
Pre-visit discussion using our clinic review tool. No additional management support is needed unless otherwise documented below in the visit note.  

## 2013-07-23 NOTE — Progress Notes (Signed)
Subjective:    Patient ID: Donna Hensley, female    DOB: 23-Nov-1965, 47 y.o.   MRN: [id number]  HPI 48 year old female with past history of GERD, seasonal allergies and asthma who comes in today for a scheduled follow up.  States she has been doing relatively well.  Has issues with her allergies and asthma - seasonally.  Uses an inhaler as needed.  States she is having to use the inhaler on a more regular basis recently.  Taking singulair q hs and zyrtec q am.  No significant sob.  No cardiac symptoms with increased activity or exertion.  No acid reflux. Takes omeprazole.  Controls.  Recently had her gallbladder removed.  While in the hospital, blood pressure elevated.  Had lab recently.  A1c 6.4.  Have discussed diet adjustment and exercise.  Started lisinopril/hctz.  Blood pressure better.  Some dry eyes.  Did not tolerate restasis.  Seeing opthalmology.  She request a referral to Dr Ouida Sills to discuss weight loss.     Past Medical History  Diagnosis Date  . Allergy   . GERD (gastroesophageal reflux disease)   . Asthma   . Gestational diabetes     Current Outpatient Prescriptions on File Prior to Visit  Medication Sig Dispense Refill  . Budesonide 90 MCG/ACT inhaler Inhale 1 puff into the lungs 2 (two) times daily.  1 Inhaler  1  . cetirizine (ZYRTEC) 10 MG tablet Take 10 mg by mouth daily.      . fluticasone (FLONASE) 50 MCG/ACT nasal spray USE 2 SPRAYS NASALLY DAILY  16 g  5  . fluticasone (FLOVENT HFA) 110 MCG/ACT inhaler Inhale 2 puffs into the lungs 2 (two) times daily.  1 Inhaler  0  . lisinopril-hydrochlorothiazide (PRINZIDE,ZESTORETIC) 10-12.5 MG per tablet Take 1 tablet by mouth daily.  30 tablet  0  . montelukast (SINGULAIR) 10 MG tablet TAKE 1 TABLET DAILY AT BEDTIME  90 tablet  1  . Multiple Vitamin (MULTIVITAMIN) tablet Take 1 tablet by mouth daily.      [name] omeprazole (PRILOSEC) 40 MG capsule TAKE 1 CAPSULE DAILY  90 capsule  1  . albuterol (PROVENTIL HFA;VENTOLIN  HFA) 108 (90 BASE) MCG/ACT inhaler Inhale 2 puffs into the lungs every 6 (six) hours as needed for wheezing.  1 Inhaler  3   No current facility-administered medications on file prior to visit.    Review of Systems Patient denies any headache, lightheadedness or dizziness.  Some sinus and allergy symptoms currently.  Flares intermittently.  No chest pain, tightness or palpitations.  No increased shortness of breath, cough or congestion.  No nausea or vomiting.  No acid reflux.  Omeprazole controls.  No abdominal pain or cramping.  No bowel change, such as diarrhea, constipation, BRBPR or melana.  No urine change.  Periods regular.  Blood pressure better on lisinopril/hctz.  Dry eyes.       Objective:   Physical Exam  Filed Vitals:   07/23/13 0922  BP: 110/80  Pulse: 83  Temp: 98.2 F (36.8 C)   Blood pressure recheck:  69/50  48 year old female in no acute distress.   HEENT:  Nares- clear.  Oropharynx - without lesions. NECK:  Supple.  Nontender.  No audible bruit.  HEART:  Appears to be regular. LUNGS:  No crackles or wheezing audible.  Respirations even and unlabored.  RADIAL PULSE:  Equal bilaterally.  ABDOMEN:  Soft, nontender.  Bowel sounds present and normal.  No audible abdominal bruit.  EXTREMITIES:  No increased edema present.  DP pulses palpable and equal bilaterally.          Assessment & Plan:  HEALTH MAINTENANCE.  Physical 10/01/12.  Mammogram 10/09/12.

## 2013-07-26 ENCOUNTER — Encounter: Payer: Self-pay | Admitting: Internal Medicine

## 2013-07-26 ENCOUNTER — Telehealth: Payer: Self-pay | Admitting: Internal Medicine

## 2013-07-26 NOTE — Telephone Encounter (Signed)
Pt had wanting a referral to a weight loss program (Dr Pharmacist, community).  This is not through the gyn at [facility].  This is a separate entity (I think off of [address]).  Is this something that we do referrals for?  I did not think insurance would cover?  Just let me know.  Thanks.

## 2013-07-26 NOTE — Assessment & Plan Note (Signed)
Documented in records.  Pt did not report diagnosis to me.  TSH normal.

## 2013-07-26 NOTE — Assessment & Plan Note (Signed)
Remain on singulair, zyrtec and nasal sprays as she is doing.  Add steroid inhaler for her to take regularly.  Has a rescue inhaler if needed.  Follow.

## 2013-07-26 NOTE — Assessment & Plan Note (Signed)
Some increased issues with season change.  Add steroid inhaler as directed.  We had sent in for flovent.  Insurance would allow coverage for pulmicort.  She did not like asmanex.  Hard to use.  Follow.

## 2013-07-26 NOTE — Assessment & Plan Note (Signed)
Symptoms controlled on omeprazole.  Follow.  

## 2013-07-26 NOTE — Assessment & Plan Note (Signed)
Has a history of gestational diabetes.  Now with hyperglycemia.  Recent a1c 6.4.  Low carb diet and exercise.  Weight loss.  Was referred for diet adjustment and diabetes education.  Has not been able to go to the classes.  They have rescheduled.  She request referral to Dr Ouida Sills for weight loss.

## 2013-07-26 NOTE — Assessment & Plan Note (Signed)
Hemoglobin normal.  Still having periods.  Not heavy.  On multivitamin with iron.  Follow.   

## 2013-07-26 NOTE — Assessment & Plan Note (Signed)
Blood pressure doing better on lisinopril/hctz 10/12.5 q day.  Follow pressures.

## 2013-07-27 NOTE — Telephone Encounter (Signed)
Sent mychart message

## 2013-07-27 NOTE — Telephone Encounter (Signed)
I do not believe so, most weight loss clinics are self referral

## 2013-07-27 NOTE — Telephone Encounter (Signed)
Notify pt that per our referral coordinator - referral to a weight loss program is a self referral.  Thanks.

## 2013-08-06 ENCOUNTER — Other Ambulatory Visit (INDEPENDENT_AMBULATORY_CARE_PROVIDER_SITE_OTHER): Payer: BC Managed Care – PPO

## 2013-08-06 DIAGNOSIS — R739 Hyperglycemia, unspecified: Secondary | ICD-10-CM

## 2013-08-06 DIAGNOSIS — D509 Iron deficiency anemia, unspecified: Secondary | ICD-10-CM

## 2013-08-06 DIAGNOSIS — E611 Iron deficiency: Secondary | ICD-10-CM

## 2013-08-06 DIAGNOSIS — E0590 Thyrotoxicosis, unspecified without thyrotoxic crisis or storm: Secondary | ICD-10-CM

## 2013-08-06 DIAGNOSIS — O24419 Gestational diabetes mellitus in pregnancy, unspecified control: Secondary | ICD-10-CM

## 2013-08-06 DIAGNOSIS — O99810 Abnormal glucose complicating pregnancy: Secondary | ICD-10-CM

## 2013-08-06 DIAGNOSIS — I10 Essential (primary) hypertension: Secondary | ICD-10-CM

## 2013-08-06 DIAGNOSIS — R7309 Other abnormal glucose: Secondary | ICD-10-CM

## 2013-08-06 LAB — CBC WITH DIFFERENTIAL/PLATELET
Basophils Absolute: 0 10*3/uL (ref 0.0–0.1)
Basophils Relative: 0.4 % (ref 0.0–3.0)
Eosinophils Absolute: 0.3 10*3/uL (ref 0.0–0.7)
Eosinophils Relative: 3 % (ref 0.0–5.0)
HCT: 39.5 % (ref 36.0–46.0)
Hemoglobin: 13 g/dL (ref 12.0–15.0)
Lymphocytes Relative: 26.4 % (ref 12.0–46.0)
Lymphs Abs: 2.8 10*3/uL (ref 0.7–4.0)
MCHC: 32.9 g/dL (ref 30.0–36.0)
MCV: 79.8 fl (ref 78.0–100.0)
Monocytes Absolute: 0.6 10*3/uL (ref 0.1–1.0)
Monocytes Relative: 5.7 % (ref 3.0–12.0)
Neutro Abs: 6.8 10*3/uL (ref 1.4–7.7)
Neutrophils Relative %: 64.5 % (ref 43.0–77.0)
Platelets: 331 10*3/uL (ref 150.0–400.0)
RBC: 4.95 Mil/uL (ref 3.87–5.11)
RDW: 16.6 % — ABNORMAL HIGH (ref 11.5–14.6)
WBC: 10.6 10*3/uL — ABNORMAL HIGH (ref 4.5–10.5)

## 2013-08-06 LAB — LIPID PANEL
Cholesterol: 162 mg/dL (ref 0–200)
HDL: 49.1 mg/dL (ref >39.00)
LDL Cholesterol: 99 mg/dL (ref 0–99)
Total CHOL/HDL Ratio: 3
Triglycerides: 71 mg/dL (ref 0.0–149.0)
VLDL: 14.2 mg/dL (ref 0.0–40.0)

## 2013-08-06 LAB — COMPREHENSIVE METABOLIC PANEL
ALT: 29 U/L (ref 0–35)
AST: 23 U/L (ref 0–37)
Albumin: 3.5 g/dL (ref 3.5–5.2)
Alkaline Phosphatase: 92 U/L (ref 39–117)
BUN: 12 mg/dL (ref 6–23)
CO2: 27 mEq/L (ref 19–32)
Calcium: 9 mg/dL (ref 8.4–10.5)
Chloride: 103 mEq/L (ref 96–112)
Creatinine, Ser: 0.9 mg/dL (ref 0.4–1.2)
GFR: 70.95 mL/min (ref >60.00)
Glucose, Bld: 145 mg/dL — ABNORMAL HIGH (ref 70–99)
Potassium: 3.7 mEq/L (ref 3.5–5.1)
Sodium: 137 mEq/L (ref 135–145)
Total Bilirubin: 0.9 mg/dL (ref 0.3–1.2)
Total Protein: 7 g/dL (ref 6.0–8.3)

## 2013-08-06 LAB — FERRITIN: Ferritin: 8.4 ng/mL — ABNORMAL LOW (ref 10.0–291.0)

## 2013-08-06 LAB — TSH: TSH: 1.63 u[IU]/mL (ref 0.35–5.50)

## 2013-08-06 LAB — HEMOGLOBIN A1C: Hgb A1c MFr Bld: 7.1 % — ABNORMAL HIGH (ref 4.6–6.5)

## 2013-08-09 ENCOUNTER — Encounter: Payer: Self-pay | Admitting: Internal Medicine

## 2013-08-10 ENCOUNTER — Encounter: Payer: Self-pay | Admitting: *Deleted

## 2013-08-11 ENCOUNTER — Encounter: Payer: Self-pay | Admitting: *Deleted

## 2013-08-11 ENCOUNTER — Ambulatory Visit (INDEPENDENT_AMBULATORY_CARE_PROVIDER_SITE_OTHER): Payer: BC Managed Care – PPO | Admitting: Internal Medicine

## 2013-08-11 ENCOUNTER — Encounter: Payer: Self-pay | Admitting: Internal Medicine

## 2013-08-11 VITALS — BP 110/70 | HR 89 | Temp 98.6°F | Ht 62.0 in | Wt 202.5 lb

## 2013-08-11 DIAGNOSIS — J069 Acute upper respiratory infection, unspecified: Secondary | ICD-10-CM

## 2013-08-11 DIAGNOSIS — J45909 Unspecified asthma, uncomplicated: Secondary | ICD-10-CM

## 2013-08-11 MED ORDER — AZITHROMYCIN 250 MG PO TABS: ORAL_TABLET | ORAL | Status: DC

## 2013-08-11 MED ORDER — PREDNISONE 10 MG PO TABS: ORAL_TABLET | ORAL | Status: DC

## 2013-08-11 NOTE — Progress Notes (Signed)
Pre visit review using our clinic review tool, if applicable. No additional management support is needed unless otherwise documented below in the visit note. 

## 2013-08-11 NOTE — Patient Instructions (Signed)
Saline nasal spray - flush nose at least 2-3x/day.  Nasacort - 2 sprays each nostril one time per day.  Do this in the evening.   Robitussin

## 2013-08-14 ENCOUNTER — Encounter: Payer: Self-pay | Admitting: Internal Medicine

## 2013-08-15 ENCOUNTER — Telehealth: Payer: Self-pay | Admitting: Internal Medicine

## 2013-08-15 ENCOUNTER — Encounter: Payer: Self-pay | Admitting: Internal Medicine

## 2013-08-15 MED ORDER — FLUTICASONE PROPIONATE HFA 220 MCG/ACT IN AERO: 2.0000 | INHALATION_SPRAY | Freq: Two times a day (BID) | RESPIRATORY_TRACT | Status: DC

## 2013-08-15 NOTE — Assessment & Plan Note (Signed)
Symptoms and exam as outlined.  Had a sample of asmanex inhaler.  Instructed on proper way to use.  Rescue inhaler as directed.  zpak as directed.  Prednisone taper as directed.  Mucinex and robitussiin.  Saline nasal spray and steroid nasal spray as instructed.  Rest.  Fluids.  Explained to her if symptoms changed, worsened or did not resolve - she was to be reevaluated.

## 2013-08-15 NOTE — Telephone Encounter (Signed)
Spoke to pt.  Still with increased cough.  Still taking prednisone.  Only using her rescue inhaler bid.  Does not feel she is getting the asmanex in.  Will add Flovent 233mcg bid.  Increased rescue inhaler frequency.  Add mucinex DM and robitussin DM as directed.  Follow closely.  Call with update on Monday.  If any worsening symptoms in the interim - needs evaluation.

## 2013-08-15 NOTE — Assessment & Plan Note (Signed)
Symptoms as outlined.  Similar to previous flares.  Treat current infection as outlined.  Inhalers and prednisone taper as outlined.  Follow.

## 2013-08-15 NOTE — Telephone Encounter (Signed)
Pts My Chart message was apparently closed by mistake.  I called pt today to get a little more information and see about further treatment.  Unable to reach her.  Will try back.  Also left information for her to call if acute problems.    Dr Nicki Reaper

## 2013-08-15 NOTE — Progress Notes (Signed)
  Subjective:    Patient ID: Donna Hensley, female    DOB: 11/27/1965, 48 y.o.   MRN: [id number]  Cough  48 year old female with past history of GERD, seasonal allergies and asthma who comes in today as a work in with concerns regarding some sinus pressure, nasal congestion and cough.   She reports that symptoms started over the weekend.  She ran out of her zyrtc.  Took allegra.  Did not think this worked well for her.  Chest felt heavy.  Progressed.  Some heaviness and tightness with trying to take a deep breath.  Some increased sinus pressure and drainage.  Increased nasal congestion.  Minimal sore throat.  Temp 100-101.   Some increased cough and wheezing.  No nausea or vomiting.  No diarrhea.       Past Medical History  Diagnosis Date  . Allergy   . GERD (gastroesophageal reflux disease)   . Asthma   . Gestational diabetes     Current Outpatient Prescriptions on File Prior to Visit  Medication Sig Dispense Refill  . Budesonide 90 MCG/ACT inhaler Inhale 2 puffs into the lungs 2 (two) times daily.  1 Inhaler  1  . cetirizine (ZYRTEC) 10 MG tablet Take 10 mg by mouth daily.      . fluticasone (FLONASE) 50 MCG/ACT nasal spray USE 2 SPRAYS NASALLY DAILY  16 g  5  . lisinopril-hydrochlorothiazide (PRINZIDE,ZESTORETIC) 10-12.5 MG per tablet Take 1 tablet by mouth daily.  30 tablet  0  . montelukast (SINGULAIR) 10 MG tablet TAKE 1 TABLET DAILY AT BEDTIME  90 tablet  1  . Multiple Vitamin (MULTIVITAMIN) tablet Take 1 tablet by mouth daily.      [name] omeprazole (PRILOSEC) 40 MG capsule TAKE 1 CAPSULE DAILY  90 capsule  1  . albuterol (PROVENTIL HFA;VENTOLIN HFA) 108 (90 BASE) MCG/ACT inhaler Inhale 2 puffs into the lungs every 6 (six) hours as needed for wheezing.  1 Inhaler  3   No current facility-administered medications on file prior to visit.    Review of Systems  Respiratory: Positive for cough.   Patient denies any headache, lightheadedness or dizziness.  Some sinus congestion and  drainage as outlined.  No chest pain or palpitations.  Increased cough and congestion as outlined.  She relates the tightness to this.  States this has occurred previously (symptoms similar) - to her previous flares.   No nausea or vomiting.  No acid reflux.  Omeprazole controls.  No abdominal pain or cramping.  No bowel change, such as diarrhea.         Objective:   Physical Exam  Filed Vitals:   08/11/13 1503  BP: 110/70  Pulse: 89  Temp: 98.6 F (86 C)   48 year old female in no acute distress.   HEENT:  Nares- slightly erythematous turbinates.  Oropharynx - without lesions.  TMs visualized without erythema.  NECK:  Supple.  Nontender.   HEART:  Appears to be regular. LUNGS:  No crackles or wheezing audible.  Some increased cough with forced expiration.  No significant wheezing on exam.           Assessment & Plan:  HEALTH MAINTENANCE.  Physical 10/01/12.  Mammogram 10/09/12.

## 2013-08-16 NOTE — Telephone Encounter (Signed)
Spoke with pt on 11/14/13 (2030).  Discussed her cough and clinical symptoms.  See note.  Flovent rx sent in to pharmacy.  She will try mucinex DM and robitussin DM as directed.  Saline nasal spray and Flonase as directed.  Call with update.  Remain out of work.

## 2013-08-17 ENCOUNTER — Telehealth: Payer: Self-pay | Admitting: *Deleted

## 2013-08-17 ENCOUNTER — Other Ambulatory Visit: Payer: Self-pay | Admitting: *Deleted

## 2013-08-17 MED ORDER — BECLOMETHASONE DIPROPIONATE 80 MCG/ACT IN AERS: 2.0000 | INHALATION_SPRAY | Freq: Two times a day (BID) | RESPIRATORY_TRACT | Status: DC

## 2013-08-17 NOTE — Telephone Encounter (Signed)
Do they expect to get it in?  Do they have a different dose of Flovent they can give her.  I prescribed the 220.  Do they have the 110.  Just let me know.  Also, see if she has asked them to check another pharmacy.  Let me know if any problems.

## 2013-08-17 NOTE — Telephone Encounter (Signed)
Phoned Qvar to pharmacy & pt notified that it was ran through her insurance & will cost around $38.

## 2013-08-17 NOTE — Telephone Encounter (Signed)
Pt has tried the asmanex and did not feel it worked.  I am ok with her trying Qvar if pt agreeable.   Let me know if problems.

## 2013-08-17 NOTE — Telephone Encounter (Signed)
Spoke with pharmacist & he states that the Rx was placed on hold due to insurance coverage. They are recommending that she tried the Asmanex (which she already has) or Qvar. Please advise

## 2013-08-17 NOTE — Telephone Encounter (Signed)
See other phone note from 08/14/13

## 2013-08-17 NOTE — Telephone Encounter (Signed)
Pt left voicemail this morning stating that her husband went to pick up the inhailer at the pharmacy Saturday & they were out of stock. Wanted to let you know that she has not started on it yet.

## 2013-08-24 ENCOUNTER — Encounter: Payer: Self-pay | Admitting: Internal Medicine

## 2013-08-24 NOTE — Telephone Encounter (Signed)
Pt lvm stating that she is not feeling any better now. Please advise. She feels that she needs another appt.

## 2013-08-25 ENCOUNTER — Telehealth: Payer: Self-pay | Admitting: Internal Medicine

## 2013-08-25 NOTE — Telephone Encounter (Signed)
Pt states she is still not feeling better.  States she left a msg on nurse's vm yesterday but has not heard back.  Advised pt no available appts today.  Refused [place] location.  States she received an email from Dr. Nicki Reaper that Dr. Nicki Reaper would see her if she was not feeling better.  Please advise.

## 2013-08-25 NOTE — Telephone Encounter (Signed)
Pt coming in tomorrow at 1:45

## 2013-08-25 NOTE — Telephone Encounter (Signed)
Sent Electrical engineer for response

## 2013-08-25 NOTE — Telephone Encounter (Signed)
Please advise. I did get her voicemail yesterday and I apparently attached it through a mychart message somehow.

## 2013-08-25 NOTE — Telephone Encounter (Signed)
I sent her a my chart message last night.  I am unable to work her in today since already work ins.  I can work her in tomorrow at 1:45.  If needs eval today, can see if Raquel will see for persistent cough and congestion.

## 2013-08-26 ENCOUNTER — Encounter: Payer: Self-pay | Admitting: Internal Medicine

## 2013-08-26 ENCOUNTER — Ambulatory Visit (INDEPENDENT_AMBULATORY_CARE_PROVIDER_SITE_OTHER)
Admission: RE | Admit: 2013-08-26 | Discharge: 2013-08-26 | Disposition: A | Discharge status: Home / Self Care | Payer: BC Managed Care – PPO | Source: Ambulatory Visit | Attending: Internal Medicine | Admitting: Internal Medicine

## 2013-08-26 ENCOUNTER — Ambulatory Visit (INDEPENDENT_AMBULATORY_CARE_PROVIDER_SITE_OTHER): Payer: BC Managed Care – PPO | Admitting: Internal Medicine

## 2013-08-26 VITALS — BP 110/70 | HR 93 | Temp 98.8°F | Resp 16 | Ht 62.0 in | Wt 201.5 lb

## 2013-08-26 DIAGNOSIS — R062 Wheezing: Secondary | ICD-10-CM

## 2013-08-26 DIAGNOSIS — R059 Cough, unspecified: Secondary | ICD-10-CM

## 2013-08-26 DIAGNOSIS — R05 Cough: Secondary | ICD-10-CM

## 2013-08-26 DIAGNOSIS — I10 Essential (primary) hypertension: Secondary | ICD-10-CM

## 2013-08-26 MED ORDER — LOSARTAN POTASSIUM 25 MG PO TABS: 25.0000 mg | ORAL_TABLET | Freq: Every day | ORAL | Status: DC

## 2013-08-26 MED ORDER — LEVOFLOXACIN 500 MG PO TABS: 500.0000 mg | ORAL_TABLET | Freq: Every day | ORAL | Status: DC

## 2013-08-26 MED ORDER — HYDROCHLOROTHIAZIDE 12.5 MG PO CAPS: 12.5000 mg | ORAL_CAPSULE | Freq: Every day | ORAL | Status: DC

## 2013-08-26 MED ORDER — PREDNISONE 10 MG PO TABS: ORAL_TABLET | ORAL | Status: DC

## 2013-08-26 NOTE — Progress Notes (Signed)
Pre visit review using our clinic review tool, if applicable. No additional management support is needed unless otherwise documented below in the visit note. 

## 2013-08-30 ENCOUNTER — Encounter: Payer: Self-pay | Admitting: Internal Medicine

## 2013-08-30 NOTE — Assessment & Plan Note (Signed)
Persistent cough and now with increased chest congestion and tightness.  Check cxr.  Treat with Levaquin as directed.  Steroid inhaler and rescue inhaler as directed.  Mucinex/robitussin as directed.  Steroid nasal spray and saline nasal spray as directed.  Rest.  Fluids.  Follow.  Stop the ACE inhibitor.  Change to Losartan and hctz as directed.  After discussion and issues with reoccurring flares, will refer to pulmonary for further evaluation and treatment recommendations.

## 2013-08-30 NOTE — Assessment & Plan Note (Signed)
Blood pressure has been doing well on lisinopril/hctz 10/12.5 q day.  Given the recent persistent issues with continued cough, will change the lisinopril/hctz to losartan 25mg  q day and hctz 12.5mg  q day.  Follow pressures.

## 2013-08-30 NOTE — Progress Notes (Signed)
  Subjective:    Patient ID: Donna Hensley, female    DOB: 03/04/1966, 48 y.o.   MRN: [id number]  Cough  48 year old female with past history of GERD, seasonal allergies and asthma who comes in today as a work in with concerns regarding persistent cough and congestion.   Was evaluated recently for cough and congestion.  Was given prednisone taper and zpak with inhalers.  It has been difficult finding a steroid inhaler that insurance will cover.  She states symptoms initially improved, but the cough has persisted.  Has a diagnosis of asthma.  States when she gets sick, it usually takes her a while to recover.  Infections have not been as frequent over the last several years.  Reports now with persistent cough and chest congestion.  Chest feels tight.  No vomiting.  She is eating and drinking.  Blood pressure has been doing well.  Out of work.       Past Medical History  Diagnosis Date  . Allergy   . GERD (gastroesophageal reflux disease)   . Asthma   . Gestational diabetes     Current Outpatient Prescriptions on File Prior to Visit  Medication Sig Dispense Refill  . beclomethasone (QVAR) 80 MCG/ACT inhaler Inhale 2 puffs into the lungs 2 (two) times daily.  1 Inhaler  1  . Budesonide 90 MCG/ACT inhaler Inhale 2 puffs into the lungs 2 (two) times daily.  1 Inhaler  1  . cetirizine (ZYRTEC) 10 MG tablet Take 10 mg by mouth daily.      . fluticasone (FLONASE) 50 MCG/ACT nasal spray USE 2 SPRAYS NASALLY DAILY  16 g  5  . montelukast (SINGULAIR) 10 MG tablet TAKE 1 TABLET DAILY AT BEDTIME  90 tablet  1  . Multiple Vitamin (MULTIVITAMIN) tablet Take 1 tablet by mouth daily.      [name] omeprazole (PRILOSEC) 40 MG capsule TAKE 1 CAPSULE DAILY  90 capsule  1   No current facility-administered medications on file prior to visit.    Review of Systems  Respiratory: Positive for cough.   Patient denies any headache, lightheadedness or dizziness.  Some drainage.  No significant sinus pressure.  No  chest pain or palpitations.  Increased cough and congestion as outlined.  She relates the tightness to this.  States this has occurred previously (symptoms similar) - to her previous flares.   No nausea or vomiting.  No acid reflux.  Omeprazole controls.  No abdominal pain or cramping.  No bowel change, such as diarrhea.  Tired from coughing.  No new exposures. Cough now productive of colored mucus.         Objective:   Physical Exam  Filed Vitals:   08/26/13 1353  BP: 110/70  Pulse: 93  Temp: 98.8 F (37.1 C)  Resp: 70   48 year old female in no acute distress.   HEENT:  Nares- slightly erythematous turbinates.  Oropharynx - without lesions.  TMs visualized without erythema.  NECK:  Supple.  Nontender.   HEART:  Appears to be regular. LUNGS:  No crackles or wheezing audible.  Some increased cough with forced expiration.  No significant wheezing on exam.           Assessment & Plan:  HEALTH MAINTENANCE.  Physical 10/01/12.  Mammogram 10/09/12.

## 2013-08-31 ENCOUNTER — Encounter: Payer: Self-pay | Admitting: Internal Medicine

## 2013-09-01 MED ORDER — FIRST-DUKES MOUTHWASH MT SUSP: OROMUCOSAL | Status: DC

## 2013-09-01 NOTE — Telephone Encounter (Signed)
Order sent in for Dukes Mouthwash.

## 2013-09-03 ENCOUNTER — Encounter: Payer: Self-pay | Admitting: Internal Medicine

## 2013-09-03 ENCOUNTER — Ambulatory Visit (INDEPENDENT_AMBULATORY_CARE_PROVIDER_SITE_OTHER): Payer: BC Managed Care – PPO | Admitting: Internal Medicine

## 2013-09-03 VITALS — BP 120/80 | HR 92 | Temp 98.2°F | Ht 62.0 in | Wt 199.5 lb

## 2013-09-03 DIAGNOSIS — R059 Cough, unspecified: Secondary | ICD-10-CM

## 2013-09-03 DIAGNOSIS — K219 Gastro-esophageal reflux disease without esophagitis: Secondary | ICD-10-CM

## 2013-09-03 DIAGNOSIS — D509 Iron deficiency anemia, unspecified: Secondary | ICD-10-CM

## 2013-09-03 DIAGNOSIS — E611 Iron deficiency: Secondary | ICD-10-CM

## 2013-09-03 DIAGNOSIS — R05 Cough: Secondary | ICD-10-CM

## 2013-09-03 DIAGNOSIS — I10 Essential (primary) hypertension: Secondary | ICD-10-CM

## 2013-09-03 DIAGNOSIS — J309 Allergic rhinitis, unspecified: Secondary | ICD-10-CM

## 2013-09-03 DIAGNOSIS — E119 Type 2 diabetes mellitus without complications: Secondary | ICD-10-CM

## 2013-09-03 MED ORDER — METFORMIN HCL 500 MG PO TABS: 500.0000 mg | ORAL_TABLET | Freq: Every day | ORAL | Status: DC

## 2013-09-03 MED ORDER — FLUCONAZOLE 150 MG PO TABS: ORAL_TABLET | ORAL | Status: DC

## 2013-09-03 NOTE — Progress Notes (Signed)
Pre visit review using our clinic review tool, if applicable. No additional management support is needed unless otherwise documented below in the visit note. 

## 2013-09-06 ENCOUNTER — Encounter: Payer: Self-pay | Admitting: Internal Medicine

## 2013-09-06 DIAGNOSIS — E119 Type 2 diabetes mellitus without complications: Secondary | ICD-10-CM | POA: Insufficient documentation

## 2013-09-06 NOTE — Assessment & Plan Note (Signed)
Symptoms controlled on omeprazole.  Follow.  

## 2013-09-06 NOTE — Assessment & Plan Note (Signed)
Remain on singulair, zyrtec and nasal sprays as she is doing.  Currently stable.    

## 2013-09-06 NOTE — Progress Notes (Signed)
Subjective:    Patient ID: Donna Hensley, female    DOB: 1965-05-03, 48 y.o.   MRN: [id number]  HPI 48 year old female with past history of GERD, seasonal allergies and asthma who comes in today for a scheduled follow up.  States she has been doing better.   Normally has issues with her allergies and asthma - seasonally.  Recently has had increased cough and congestion as outlined.  See previous notes for details.  Cough is better.  Sleeping better.  Taking singulair q hs and zyrtec q am.  No sob.  No cardiac symptoms with increased activity or exertion.  No acid reflux. Takes omeprazole.  Controls.  Recently had her gallbladder removed.  While in the hospital, blood pressure elevated.  Had lab recently.  A1c 7.1  Have discussed diet adjustment and exercise.  Started lisinopril/hctz.  Changed to Losartan last visit.  Blood pressure better.  Overall feels she is doing well.      Past Medical History  Diagnosis Date  . Allergy   . GERD (gastroesophageal reflux disease)   . Asthma   . Gestational diabetes     Current Outpatient Prescriptions on File Prior to Visit  Medication Sig Dispense Refill  . albuterol (PROVENTIL HFA;VENTOLIN HFA) 108 (90 BASE) MCG/ACT inhaler Inhale 2 puffs into the lungs every 6 (six) hours as needed for wheezing.      . beclomethasone (QVAR) 80 MCG/ACT inhaler Inhale 2 puffs into the lungs 2 (two) times daily.  1 Inhaler  1  . cetirizine (ZYRTEC) 10 MG tablet Take 10 mg by mouth daily.      . Diphenhyd-Hydrocort-Nystatin (FIRST-DUKES MOUTHWASH) SUSP 10 ccs swish and spit tid  237 mL  0  . fluticasone (FLONASE) 50 MCG/ACT nasal spray USE 2 SPRAYS NASALLY DAILY  16 g  5  . hydrochlorothiazide (MICROZIDE) 12.5 MG capsule Take 1 capsule (12.5 mg total) by mouth daily.  30 capsule  1  . levofloxacin (LEVAQUIN) 500 MG tablet Take 1 tablet (500 mg total) by mouth daily.  10 tablet  0  . losartan (COZAAR) 25 MG tablet Take 1 tablet (25 mg total) by mouth daily.  30 tablet   1  . montelukast (SINGULAIR) 10 MG tablet TAKE 1 TABLET DAILY AT BEDTIME  90 tablet  1  . Multiple Vitamin (MULTIVITAMIN) tablet Take 1 tablet by mouth daily.      [name] omeprazole (PRILOSEC) 40 MG capsule TAKE 1 CAPSULE DAILY  90 capsule  1  . predniSONE (DELTASONE) 10 MG tablet Take 6 tablets x 1 day and then decrease by 1/2 tablet per day until down to zero mg.  39 tablet  0   No current facility-administered medications on file prior to visit.    Review of Systems Patient denies any headache, lightheadedness or dizziness.  No significant sinus or allergy symptoms.  No chest pain, tightness or palpitations.  No increased shortness of breath.  Cough/congestion better.   No nausea or vomiting.  No acid reflux.  Omeprazole controls.  No abdominal pain or cramping.  No bowel change, such as diarrhea, constipation, BRBPR or melana.  No urine change.  Periods regular.  Blood pressure doing well.  On Losartan and hctz now.  Discussed elevated sugars.  Last a1c 7.1.  She had gestational diabetes.  Knows how to check sugars.        Objective:   Physical Exam  Filed Vitals:   09/03/13 0908  BP: 120/80  Pulse: 92  Temp: 98.2  F (36.8 C)   Pulse 12  48 year old female in no acute distress.   HEENT:  Nares- clear.  Oropharynx - without lesions. NECK:  Supple.  Nontender.  No audible bruit.  HEART:  Appears to be regular. LUNGS:  No crackles or wheezing audible.  Respirations even and unlabored.  RADIAL PULSE:  Equal bilaterally.  ABDOMEN:  Soft, nontender.  Bowel sounds present and normal.  No audible abdominal bruit.  EXTREMITIES:  No increased edema present.  DP pulses palpable and equal bilaterally.  FEET:  No lesions.           Assessment & Plan:  HEALTH MAINTENANCE.  Physical 10/01/12.  Mammogram 10/09/12.

## 2013-09-06 NOTE — Assessment & Plan Note (Signed)
Blood pressure doing well on losartan 25mg  q day and hctz 12.5mg  q day.  Follow pressures.

## 2013-09-06 NOTE — Assessment & Plan Note (Signed)
Hemoglobin normal.  Still having periods.  Not heavy.  On multivitamin with iron.  Follow.   

## 2013-09-06 NOTE — Assessment & Plan Note (Signed)
Significantly improved.  Follow.

## 2013-09-06 NOTE — Assessment & Plan Note (Signed)
a1c recently checked 7.1.  Discussed diet and exercise.  Gave her a glucometer.  Start metformin 500mg  q day as directed.  Check and record sugars at least bid.  Follow.  Adjust medications as needed.

## 2013-09-08 ENCOUNTER — Institutional Professional Consult (permissible substitution): Payer: BC Managed Care – PPO | Admitting: Pulmonary Disease

## 2013-09-26 ENCOUNTER — Other Ambulatory Visit: Payer: Self-pay | Admitting: Internal Medicine

## 2013-09-28 ENCOUNTER — Encounter: Payer: Self-pay | Admitting: Pulmonary Disease

## 2013-09-28 ENCOUNTER — Ambulatory Visit (INDEPENDENT_AMBULATORY_CARE_PROVIDER_SITE_OTHER): Payer: BC Managed Care – PPO | Admitting: Pulmonary Disease

## 2013-09-28 VITALS — BP 128/70 | HR 95 | Ht 62.0 in | Wt 198.0 lb

## 2013-09-28 DIAGNOSIS — J45901 Unspecified asthma with (acute) exacerbation: Secondary | ICD-10-CM

## 2013-09-28 DIAGNOSIS — J45909 Unspecified asthma, uncomplicated: Secondary | ICD-10-CM

## 2013-09-28 DIAGNOSIS — R059 Cough, unspecified: Secondary | ICD-10-CM

## 2013-09-28 DIAGNOSIS — K219 Gastro-esophageal reflux disease without esophagitis: Secondary | ICD-10-CM

## 2013-09-28 DIAGNOSIS — R05 Cough: Secondary | ICD-10-CM

## 2013-09-28 NOTE — Assessment & Plan Note (Signed)
This symptom has now resolved. I believe that it was due to 2 an upper respiratory infection and perpetuated by acid reflux and vocal cord irritation.  Plan: -With next cold use over-the-counter sinus and cough remedies early on -With next cold follow GERD lifestyle modification closely and double dose of PPI

## 2013-09-28 NOTE — Assessment & Plan Note (Signed)
Currently well-controlled, but I think that it does contribute to her cough.  Plan:  -with her next episode of cough I have advised her to follow acid reflux lifestyle modification and double dose of her Prilosec temporarily.

## 2013-09-28 NOTE — Progress Notes (Signed)
Subjective:    Patient ID: Donna Hensley, female    DOB: 1966/02/16, 48 y.o.   MRN: [id number]  HPI  This is a very pleasant 48 year old female who comes to my clinic today for evaluation of cough. She tells me that she has been told in the past that she has asthma. She has only smoked a few cigarettes in high school but none as a young adult or as an adult.  When asked further about her history of asthma she says that really she only has problems when she has an upper respiratory infection. Typically when she gets a cough she will get chest tightness, wheezing, and maybe some mild shortness of breath. These episodes have really not been very frequent in that she had a bad spell in 2015 and that was the first in about 5 years. She does not have frequent episodes of bronchitis nor has she been hospitalized for a respiratory problem. She did not have asthma as a child.  However, she did note that she was born about 3 months premature and she had frequent episodes of bronchitis as a child.  In between episodes of upper respiratory infections she does not have problems with chest tightness or shortness of breath.  The most recent cough and cold episode she had occurred a few weeks back. She had an upper respiratory infection which was followed by 3-5 weeks of a prolonged dry cough. Occasionally she would produce mucus but this is rare. She was treated with 2 rounds of prednisone as well as an antibiotic. She was also started on an inhaled corticosteroid. She said that the cough would typically get worse when she would lie down flat at night. It did not changed with eating. She did not have problems with shortness of breath.  Past Medical History  Diagnosis Date  . Allergy   . GERD (gastroesophageal reflux disease)   . Asthma   . Gestational diabetes      Family History  Problem Relation Age of Onset  . Hyperlipidemia Mother   . Osteoporosis Mother   . Hypertension Father   . Arthritis  Father   . Sleep apnea Father   . Heart disease Father   . Sleep apnea Brother   . Arthritis Paternal Grandmother   . Diabetes Paternal Grandmother      History   Social History  . Marital Status: Married    Spouse Name: N/A    Number of Children: 2  . 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 Smoking status: Former Smoker -- 0.25 packs/day for 5 years    Types: Cigarettes    Quit date: 09/28/1988  . Smokeless tobacco: Never Used     Comment: smoked briefly as a teenager  . Alcohol Use: Yes     Comment: Occasional  . Drug Use: No  . Sexual Activity: Not on file   Other Topics Concern  . Not on file   Social History Narrative   Regular exercise-yes, walk   Diet: fast food, trying to improve diet           Allergies  Allergen Reactions  . Doxycycline Nausea And Vomiting  . Oxycodone Nausea And Vomiting     Outpatient Prescriptions Prior to Visit  Medication Sig Dispense Refill  . albuterol (PROVENTIL HFA;VENTOLIN HFA) 108 (90 BASE) MCG/ACT inhaler Inhale 2 puffs into the lungs every 6 (six) hours as needed for wheezing.      [name]  beclomethasone (QVAR) 80 MCG/ACT inhaler Inhale 2 puffs into the lungs 2 (two) times daily.  1 Inhaler  1  . cetirizine (ZYRTEC) 10 MG tablet Take 10 mg by mouth daily.      . fluticasone (FLONASE) 50 MCG/ACT nasal spray USE 2 SPRAYS NASALLY DAILY  16 g  5  . hydrochlorothiazide (MICROZIDE) 12.5 MG capsule Take 1 capsule (12.5 mg total) by mouth daily.  30 capsule  1  . losartan (COZAAR) 25 MG tablet Take 1 tablet (25 mg total) by mouth daily.  30 tablet  1  . metFORMIN (GLUCOPHAGE) 500 MG tablet Take 1 tablet (500 mg total) by mouth daily.  30 tablet  3  . montelukast (SINGULAIR) 10 MG tablet TAKE 1 TABLET DAILY AT BEDTIME  90 tablet  1  . Multiple Vitamin (MULTIVITAMIN) tablet Take 1 tablet by mouth daily.      [name] omeprazole (PRILOSEC) 40 MG capsule TAKE 1 CAPSULE DAILY  90 capsule  1  .  Diphenhyd-Hydrocort-Nystatin (FIRST-DUKES MOUTHWASH) SUSP 10 ccs swish and spit tid  237 mL  0  . fluconazole (DIFLUCAN) 150 MG tablet Take one tablet x 1.  If persistent symptoms repeat x 1  2 tablet  0  . levofloxacin (LEVAQUIN) 500 MG tablet Take 1 tablet (500 mg total) by mouth daily.  10 tablet  0  . predniSONE (DELTASONE) 10 MG tablet Take 6 tablets x 1 day and then decrease by 1/2 tablet per day until down to zero mg.  39 tablet  0   No facility-administered medications prior to visit.      Review of Systems  Constitutional: Negative for fever and unexpected weight change.  HENT: Positive for postnasal drip. Negative for congestion, dental problem, ear pain, nosebleeds, rhinorrhea, sinus pressure, sneezing, sore throat and trouble swallowing.   Eyes: Negative for redness and itching.  Respiratory: Positive for cough. Negative for chest tightness, shortness of breath and wheezing.   Cardiovascular: Negative for palpitations and leg swelling.  Gastrointestinal: Negative for nausea and vomiting.  Genitourinary: Negative for dysuria.  Musculoskeletal: Negative for joint swelling.  Skin: Negative for rash.  Neurological: Negative for headaches.  Hematological: Does not bruise/bleed easily.  Psychiatric/Behavioral: Negative for dysphoric mood. The patient is not nervous/anxious.        Objective:   Physical Exam Filed Vitals:   09/28/13 1405  BP: 128/70  Pulse: 95  Height: 5\' 2"  (1.575 m)  Weight: 198 lb (89.812 kg)  SpO2: 98%    RA  Gen: well appearing, no acute distress HEENT: NCAT, PERRL, EOMi, OP clear, neck supple without masses PULM: CTA B CV: RRR, no mgr, no JVD AB: BS+, soft, nontender, no hsm Ext: warm, no edema, no clubbing, no cyanosis Derm: no rash or skin breakdown Neuro: A&Ox4, CN II-XII intact, strength 5/5 in all 4 extremities       Assessment & Plan:   ASTHMA, WITH ACUTE EXACERBATION It sounds like her asthma is mild overall. She does not have  symptoms of cough or shortness of breath at this point. Further, she really does not have symptoms of asthma in between episodes of upper respiratory infections.  It is possible that she has mild asthma that is exacerbated by URIs. If that is the case, then using inhaled corticosteroids on an as-needed basis with colds is not unreasonable.  Plan: -Obtain full pulmonary function testing. -If completely normal, will order methacholine challenge test to rule out asthma  Cough This symptom has now resolved. I believe  that it was due to 2 an upper respiratory infection and perpetuated by acid reflux and vocal cord irritation.  Plan: -With next cold use over-the-counter sinus and cough remedies early on -With next cold follow GERD lifestyle modification closely and double dose of PPI  GERD Currently well-controlled, but I think that it does contribute to her cough.  Plan:  -with her next episode of cough I have advised her to follow acid reflux lifestyle modification and double dose of her Prilosec temporarily.   Updated Medication List Outpatient Encounter Prescriptions as of 09/28/2013  Medication Sig  . albuterol (PROVENTIL HFA;VENTOLIN HFA) 108 (90 BASE) MCG/ACT inhaler Inhale 2 puffs into the lungs every 6 (six) hours as needed for wheezing.  . beclomethasone (QVAR) 80 MCG/ACT inhaler Inhale 2 puffs into the lungs 2 (two) times daily.  . cetirizine (ZYRTEC) 10 MG tablet Take 10 mg by mouth daily.  . fluticasone (FLONASE) 50 MCG/ACT nasal spray USE 2 SPRAYS NASALLY DAILY  . hydrochlorothiazide (MICROZIDE) 12.5 MG capsule Take 1 capsule (12.5 mg total) by mouth daily.  [name] losartan (COZAAR) 25 MG tablet Take 1 tablet (25 mg total) by mouth daily.  . metFORMIN (GLUCOPHAGE) 500 MG tablet Take 1 tablet (500 mg total) by mouth daily.  . montelukast (SINGULAIR) 10 MG tablet TAKE 1 TABLET DAILY AT BEDTIME  . Multiple Vitamin (MULTIVITAMIN) tablet Take 1 tablet by mouth daily.  [name] omeprazole  (PRILOSEC) 40 MG capsule TAKE 1 CAPSULE DAILY  . [DISCONTINUED] Diphenhyd-Hydrocort-Nystatin (FIRST-DUKES MOUTHWASH) SUSP 10 ccs swish and spit tid  . [DISCONTINUED] fluconazole (DIFLUCAN) 150 MG tablet Take one tablet x 1.  If persistent symptoms repeat x 1  . [DISCONTINUED] levofloxacin (LEVAQUIN) 500 MG tablet Take 1 tablet (500 mg total) by mouth daily.  . [DISCONTINUED] predniSONE (DELTASONE) 10 MG tablet Take 6 tablets x 1 day and then decrease by 1/2 tablet per day until down to zero mg.

## 2013-09-28 NOTE — Patient Instructions (Signed)
Stop taking QVar We will arrange a lung function test at [facility]  When you get a cold, use the following:  1) For sinus congestion: phenylephrine or pseudoephedrine and saline rinses 2) For cough: delsym twice a day 3) For thick mucus: mucinex (generic guaifenesin is OK) 4) Follow the GERD lifestyle sheet and double dose of your prilosec  We will see you back as needed or if the symptoms return

## 2013-09-28 NOTE — Assessment & Plan Note (Signed)
It sounds like her asthma is mild overall. She does not have symptoms of cough or shortness of breath at this point. Further, she really does not have symptoms of asthma in between episodes of upper respiratory infections.  It is possible that she has mild asthma that is exacerbated by URIs. If that is the case, then using inhaled corticosteroids on an as-needed basis with colds is not unreasonable.  Plan: -Obtain full pulmonary function testing. -If completely normal, will order methacholine challenge test to rule out asthma

## 2013-10-06 ENCOUNTER — Other Ambulatory Visit: Payer: Self-pay | Admitting: *Deleted

## 2013-10-06 ENCOUNTER — Encounter: Payer: Self-pay | Admitting: *Deleted

## 2013-10-08 ENCOUNTER — Ambulatory Visit: Payer: Self-pay | Admitting: Pulmonary Disease

## 2013-10-08 LAB — PULMONARY FUNCTION TEST

## 2013-10-08 NOTE — Telephone Encounter (Signed)
Unread mychart message mailed to patient 

## 2013-10-13 ENCOUNTER — Telehealth: Payer: Self-pay

## 2013-10-13 ENCOUNTER — Encounter: Payer: Self-pay | Admitting: Pulmonary Disease

## 2013-10-13 DIAGNOSIS — J45909 Unspecified asthma, uncomplicated: Secondary | ICD-10-CM

## 2013-10-13 NOTE — Telephone Encounter (Signed)
Message copied by Len Blalock on Tue Oct 13, 2013  5:15 PM ------      Message from: Donna Hensley      Created: Tue Oct 13, 2013  5:40 AM       A,            Please let her know that her PFT did not show evidence of asthma.  Because of this I would like for her to have a methacholine challenge test at [facility]            Thanks      B ------

## 2013-10-13 NOTE — Telephone Encounter (Signed)
lmtcb X1.  Will order methacholine challenge after speaking to pt.

## 2013-10-15 NOTE — Telephone Encounter (Signed)
Spoke with pt, she is aware of results and recs.  Methacholine challenge ordered.  Nothing further needed.

## 2013-10-20 ENCOUNTER — Ambulatory Visit (INDEPENDENT_AMBULATORY_CARE_PROVIDER_SITE_OTHER): Payer: BC Managed Care – PPO | Admitting: Internal Medicine

## 2013-10-20 ENCOUNTER — Encounter: Payer: Self-pay | Admitting: Internal Medicine

## 2013-10-20 VITALS — BP 98/60 | HR 82 | Temp 98.8°F | Ht 62.1 in | Wt 195.2 lb

## 2013-10-20 DIAGNOSIS — J309 Allergic rhinitis, unspecified: Secondary | ICD-10-CM

## 2013-10-20 DIAGNOSIS — Z1239 Encounter for other screening for malignant neoplasm of breast: Secondary | ICD-10-CM

## 2013-10-20 DIAGNOSIS — R059 Cough, unspecified: Secondary | ICD-10-CM

## 2013-10-20 DIAGNOSIS — I10 Essential (primary) hypertension: Secondary | ICD-10-CM

## 2013-10-20 DIAGNOSIS — J45901 Unspecified asthma with (acute) exacerbation: Secondary | ICD-10-CM

## 2013-10-20 DIAGNOSIS — E611 Iron deficiency: Secondary | ICD-10-CM

## 2013-10-20 DIAGNOSIS — R05 Cough: Secondary | ICD-10-CM

## 2013-10-20 DIAGNOSIS — K219 Gastro-esophageal reflux disease without esophagitis: Secondary | ICD-10-CM

## 2013-10-20 DIAGNOSIS — E119 Type 2 diabetes mellitus without complications: Secondary | ICD-10-CM

## 2013-10-20 DIAGNOSIS — D509 Iron deficiency anemia, unspecified: Secondary | ICD-10-CM

## 2013-10-20 MED ORDER — LOSARTAN POTASSIUM 25 MG PO TABS: 25.0000 mg | ORAL_TABLET | Freq: Every day | ORAL | Status: DC

## 2013-10-20 MED ORDER — METFORMIN HCL 500 MG PO TABS: 500.0000 mg | ORAL_TABLET | Freq: Every day | ORAL | Status: DC

## 2013-10-20 NOTE — Progress Notes (Signed)
Pre visit review using our clinic review tool, if applicable. No additional management support is needed unless otherwise documented below in the visit note. 

## 2013-10-25 ENCOUNTER — Encounter: Payer: Self-pay | Admitting: Internal Medicine

## 2013-10-25 MED ORDER — LOSARTAN POTASSIUM 25 MG PO TABS: 25.0000 mg | ORAL_TABLET | Freq: Every day | ORAL | Status: DC

## 2013-10-25 MED ORDER — METFORMIN HCL 500 MG PO TABS: 500.0000 mg | ORAL_TABLET | Freq: Every day | ORAL | Status: DC

## 2013-10-25 NOTE — Progress Notes (Signed)
Subjective:    Patient ID: Donna Hensley, female    DOB: 12-11-1965, 48 y.o.   MRN: [id number]  HPI 48 year old female with past history of GERD, seasonal allergies and asthma who comes in today to follow up on these issues as well as for a complete physical exam.   States she has been doing better.  Cough is better.  Actually resolved.  Sleeping better.  Taking singulair q hs and zyrtec q am.  No sob.  No cardiac symptoms with increased activity or exertion. No acid reflux. Takes omeprazole.  Controls.   Blood pressure better.  Overall feels she is doing well.   Has adjusted her diet.  Has lost weight.  AM sugars 103-120s and pm sugars 100-150s.  Feels better.     Past Medical History  Diagnosis Date  . Allergy   . GERD (gastroesophageal reflux disease)   . Asthma   . Gestational diabetes     Current Outpatient Prescriptions on File Prior to Visit  Medication Sig Dispense Refill  . albuterol (PROVENTIL HFA;VENTOLIN HFA) 108 (90 BASE) MCG/ACT inhaler Inhale 2 puffs into the lungs every 6 (six) hours as needed for wheezing.      . beclomethasone (QVAR) 80 MCG/ACT inhaler Inhale 2 puffs into the lungs 2 (two) times daily.  1 Inhaler  1  . cetirizine (ZYRTEC) 10 MG tablet Take 10 mg by mouth daily.      . fluticasone (FLONASE) 50 MCG/ACT nasal spray USE 2 SPRAYS NASALLY DAILY  16 g  5  . hydrochlorothiazide (MICROZIDE) 12.5 MG capsule Take 1 capsule (12.5 mg total) by mouth daily.  30 capsule  1  . montelukast (SINGULAIR) 10 MG tablet TAKE 1 TABLET DAILY AT BEDTIME  90 tablet  1  . Multiple Vitamin (MULTIVITAMIN) tablet Take 1 tablet by mouth daily.      [name] omeprazole (PRILOSEC) 40 MG capsule TAKE 1 CAPSULE DAILY  90 capsule  1   No current facility-administered medications on file prior to visit.    Review of Systems Patient denies any headache, lightheadedness or dizziness.  No significant sinus or allergy symptoms.  No chest pain, tightness or palpitations.  No increased shortness  of breath.  Cough/congestion resolved.   No nausea or vomiting.  No acid reflux.  Omeprazole controls.  No abdominal pain or cramping.  No bowel change, such as diarrhea, constipation, BRBPR or melana.  No urine change.  Periods regular.  Blood pressure doing well.  On Losartan and hctz now.  Blood pressure low.        Objective:   Physical Exam  Filed Vitals:   10/20/13 1604  BP: 98/60  Pulse: 82  Temp: 98.8 F (37.1 C)   Blood pressure recheck:  69/42  48 year old female in no acute distress.   HEENT:  Nares- clear.  Oropharynx - without lesions. NECK:  Supple.  Nontender.  No audible bruit.  HEART:  Appears to be regular. LUNGS:  No crackles or wheezing audible.  Respirations even and unlabored.  RADIAL PULSE:  Equal bilaterally.    BREASTS:  No nipple discharge or nipple retraction present.  Could not appreciate any distinct nodules or axillary adenopathy.  ABDOMEN:  Soft, nontender.  Bowel sounds present and normal.  No audible abdominal bruit.  GU:  Not performed.     EXTREMITIES:  No increased edema present.  DP pulses palpable and equal bilaterally.      FEET:  Without lesions.  Assessment & Plan:  HEALTH MAINTENANCE.  Physical today.  Mammogram 10/09/12.   Schedule f/u mammogram.    I spent 25 minutes with the patient and more than 50% of the time was spent in consultation regarding the above.

## 2013-10-25 NOTE — Assessment & Plan Note (Signed)
Hemoglobin normal.  Still having periods.  Not heavy.  On multivitamin with iron.  Follow.

## 2013-10-25 NOTE — Assessment & Plan Note (Signed)
Remain on singulair, zyrtec and nasal sprays as she is doing.  Currently stable.

## 2013-10-25 NOTE — Assessment & Plan Note (Signed)
Blood pressure better.  Lower today.  Will stop the hctz.  Continue losartan.  Follow pressures.

## 2013-10-25 NOTE — Assessment & Plan Note (Signed)
Symptoms controlled on omeprazole.  Follow.  

## 2013-10-25 NOTE — Assessment & Plan Note (Signed)
Resolved

## 2013-10-25 NOTE — Assessment & Plan Note (Signed)
Breathing doing well now.  Follow.   

## 2013-10-25 NOTE — Assessment & Plan Note (Signed)
Has adjusted her diet and lost weight.  On metformin 561m q day.   Sugars better.  Follow.  Schedule her for labs (met b and a1c).

## 2013-10-28 ENCOUNTER — Encounter: Payer: Self-pay | Admitting: Pulmonary Disease

## 2013-10-29 ENCOUNTER — Encounter: Payer: Self-pay | Admitting: *Deleted

## 2013-11-03 DIAGNOSIS — Z0279 Encounter for issue of other medical certificate: Secondary | ICD-10-CM

## 2013-11-19 ENCOUNTER — Encounter: Payer: Self-pay | Admitting: Internal Medicine

## 2013-11-19 ENCOUNTER — Encounter: Payer: Self-pay | Admitting: Pulmonary Disease

## 2013-11-19 MED ORDER — LOSARTAN POTASSIUM 25 MG PO TABS: 25.0000 mg | ORAL_TABLET | Freq: Every day | ORAL | Status: DC

## 2013-11-19 MED ORDER — METFORMIN HCL 500 MG PO TABS: 500.0000 mg | ORAL_TABLET | Freq: Every day | ORAL | Status: DC

## 2013-11-23 ENCOUNTER — Ambulatory Visit: Payer: Self-pay | Admitting: Pulmonary Disease

## 2013-11-23 LAB — PULMONARY FUNCTION TEST

## 2013-12-08 ENCOUNTER — Encounter: Payer: Self-pay | Admitting: Pulmonary Disease

## 2013-12-09 ENCOUNTER — Telehealth: Payer: Self-pay

## 2013-12-09 NOTE — Telephone Encounter (Signed)
lmtcb X1 to relay results.  Dr. Lake Bells, at this pt's last ov, you said we would see her back prn and she has no rov scheduled or on recall.  When would you like to see her back?

## 2013-12-09 NOTE — Telephone Encounter (Signed)
Message copied by Len Blalock on Wed Dec 09, 2013 12:27 PM ------      Message from: Simonne Maffucci B      Created: Tue Dec 08, 2013 11:22 PM       A,            Please let her test showed that she may have very mild asthma.  We will discuss more in clinic            Thanks      B ------

## 2013-12-10 NOTE — Telephone Encounter (Signed)
First available, tell her its not urgent, just need to review results of testing

## 2013-12-11 NOTE — Telephone Encounter (Signed)
Pt is aware of results and recs, scheduled for 9/16@ 4:00.  Nothing further needed at this time.

## 2013-12-31 ENCOUNTER — Encounter: Payer: Self-pay | Admitting: Pulmonary Disease

## 2014-01-13 ENCOUNTER — Ambulatory Visit: Payer: BC Managed Care – PPO | Admitting: Pulmonary Disease

## 2014-01-19 ENCOUNTER — Other Ambulatory Visit (INDEPENDENT_AMBULATORY_CARE_PROVIDER_SITE_OTHER): Payer: BC Managed Care – PPO

## 2014-01-19 DIAGNOSIS — D509 Iron deficiency anemia, unspecified: Secondary | ICD-10-CM

## 2014-01-19 DIAGNOSIS — E611 Iron deficiency: Secondary | ICD-10-CM

## 2014-01-19 DIAGNOSIS — E119 Type 2 diabetes mellitus without complications: Secondary | ICD-10-CM

## 2014-01-19 LAB — COMPREHENSIVE METABOLIC PANEL
ALT: 33 U/L (ref 0–35)
AST: 30 U/L (ref 0–37)
Albumin: 4 g/dL (ref 3.5–5.2)
Alkaline Phosphatase: 93 U/L (ref 39–117)
BUN: 13 mg/dL (ref 6–23)
CO2: 20 mEq/L (ref 19–32)
Calcium: 9.2 mg/dL (ref 8.4–10.5)
Chloride: 107 mEq/L (ref 96–112)
Creatinine, Ser: 1 mg/dL (ref 0.4–1.2)
GFR: 61.37 mL/min (ref >60.00)
Glucose, Bld: 104 mg/dL — ABNORMAL HIGH (ref 70–99)
Potassium: 3.9 mEq/L (ref 3.5–5.1)
Sodium: 138 mEq/L (ref 135–145)
Total Bilirubin: 0.7 mg/dL (ref 0.2–1.2)
Total Protein: 7.8 g/dL (ref 6.0–8.3)

## 2014-01-19 LAB — CBC WITH DIFFERENTIAL/PLATELET
Basophils Absolute: 0.1 10*3/uL (ref 0.0–0.1)
Basophils Relative: 0.8 % (ref 0.0–3.0)
Eosinophils Absolute: 0.2 10*3/uL (ref 0.0–0.7)
Eosinophils Relative: 2.8 % (ref 0.0–5.0)
HCT: 40.4 % (ref 36.0–46.0)
Hemoglobin: 13.1 g/dL (ref 12.0–15.0)
Lymphocytes Relative: 30.9 % (ref 12.0–46.0)
Lymphs Abs: 2.6 10*3/uL (ref 0.7–4.0)
MCHC: 32.5 g/dL (ref 30.0–36.0)
MCV: 75.9 fl — ABNORMAL LOW (ref 78.0–100.0)
Monocytes Absolute: 0.7 10*3/uL (ref 0.1–1.0)
Monocytes Relative: 7.8 % (ref 3.0–12.0)
Neutro Abs: 4.9 10*3/uL (ref 1.4–7.7)
Neutrophils Relative %: 57.7 % (ref 43.0–77.0)
Platelets: 311 10*3/uL (ref 150.0–400.0)
RBC: 5.32 Mil/uL — ABNORMAL HIGH (ref 3.87–5.11)
RDW: 17 % — ABNORMAL HIGH (ref 11.5–15.5)
WBC: 8.6 10*3/uL (ref 4.0–10.5)

## 2014-01-19 LAB — LIPID PANEL
Cholesterol: 152 mg/dL (ref 0–200)
HDL: 41.9 mg/dL (ref >39.00)
LDL Cholesterol: 92 mg/dL (ref 0–99)
NonHDL: 110.1
Total CHOL/HDL Ratio: 4
Triglycerides: 89 mg/dL (ref 0.0–149.0)
VLDL: 17.8 mg/dL (ref 0.0–40.0)

## 2014-01-19 LAB — FERRITIN: Ferritin: 2.6 ng/mL — ABNORMAL LOW (ref 10.0–291.0)

## 2014-01-19 LAB — MICROALBUMIN / CREATININE URINE RATIO
Creatinine,U: 220.4 mg/dL
Microalb Creat Ratio: 0.5 mg/g (ref 0.0–30.0)
Microalb, Ur: 1.1 mg/dL (ref 0.0–1.9)

## 2014-01-19 LAB — HEMOGLOBIN A1C: Hgb A1c MFr Bld: 6.4 % (ref 4.6–6.5)

## 2014-01-20 ENCOUNTER — Encounter: Payer: Self-pay | Admitting: Internal Medicine

## 2014-01-21 ENCOUNTER — Ambulatory Visit (INDEPENDENT_AMBULATORY_CARE_PROVIDER_SITE_OTHER): Payer: BC Managed Care – PPO | Admitting: Internal Medicine

## 2014-01-21 ENCOUNTER — Encounter: Payer: Self-pay | Admitting: Internal Medicine

## 2014-01-21 VITALS — BP 120/80 | HR 75 | Temp 98.3°F | Ht 62.1 in | Wt 194.5 lb

## 2014-01-21 DIAGNOSIS — J309 Allergic rhinitis, unspecified: Secondary | ICD-10-CM

## 2014-01-21 DIAGNOSIS — E611 Iron deficiency: Secondary | ICD-10-CM

## 2014-01-21 DIAGNOSIS — Z1239 Encounter for other screening for malignant neoplasm of breast: Secondary | ICD-10-CM

## 2014-01-21 DIAGNOSIS — I10 Essential (primary) hypertension: Secondary | ICD-10-CM

## 2014-01-21 DIAGNOSIS — R05 Cough: Secondary | ICD-10-CM

## 2014-01-21 DIAGNOSIS — R059 Cough, unspecified: Secondary | ICD-10-CM

## 2014-01-21 DIAGNOSIS — Z23 Encounter for immunization: Secondary | ICD-10-CM

## 2014-01-21 DIAGNOSIS — J45909 Unspecified asthma, uncomplicated: Secondary | ICD-10-CM

## 2014-01-21 DIAGNOSIS — D509 Iron deficiency anemia, unspecified: Secondary | ICD-10-CM

## 2014-01-21 DIAGNOSIS — E119 Type 2 diabetes mellitus without complications: Secondary | ICD-10-CM

## 2014-01-21 DIAGNOSIS — K219 Gastro-esophageal reflux disease without esophagitis: Secondary | ICD-10-CM

## 2014-01-21 NOTE — Progress Notes (Signed)
Pre visit review using our clinic review tool, if applicable. No additional management support is needed unless otherwise documented below in the visit note. 

## 2014-01-21 NOTE — Progress Notes (Signed)
Subjective:    Patient ID: Donna Hensley, female    DOB: 02/20/1966, 48 y.o.   MRN: [id number]  HPI 48 year old female with past history of GERD, seasonal allergies and asthma who comes in today for a scheduled follow up.  States she has been doing better.  No cough or congestion.  Breathing doing well.  Sleeping better.  Taking singulair q hs and zyrtec q am.  No sob.  No cardiac symptoms with increased activity or exertion.  Has been exercising 5 days per week.  Has adjusted her diet.  Feels better.  Sugars better.  No acid reflux. Takes omeprazole.  Controls.   Blood pressure doing well.  Off HCTZ.  Overall feels she is doing well.       Past Medical History  Diagnosis Date  . Allergy   . GERD (gastroesophageal reflux disease)   . Asthma   . Gestational diabetes     Current Outpatient Prescriptions on File Prior to Visit  Medication Sig Dispense Refill  . albuterol (PROVENTIL HFA;VENTOLIN HFA) 108 (90 BASE) MCG/ACT inhaler Inhale 2 puffs into the lungs every 6 (six) hours as needed for wheezing.      . beclomethasone (QVAR) 80 MCG/ACT inhaler Inhale 2 puffs into the lungs 2 (two) times daily.  1 Inhaler  1  . cetirizine (ZYRTEC) 10 MG tablet Take 10 mg by mouth daily.      . fluticasone (FLONASE) 50 MCG/ACT nasal spray USE 2 SPRAYS NASALLY DAILY  16 g  5  . hydrochlorothiazide (MICROZIDE) 12.5 MG capsule Take 1 capsule (12.5 mg total) by mouth daily.  30 capsule  1  . losartan (COZAAR) 25 MG tablet Take 1 tablet (25 mg total) by mouth daily.  90 tablet  3  . metFORMIN (GLUCOPHAGE) 500 MG tablet Take 1 tablet (500 mg total) by mouth daily.  90 tablet  3  . montelukast (SINGULAIR) 10 MG tablet TAKE 1 TABLET DAILY AT BEDTIME  90 tablet  1  . Multiple Vitamin (MULTIVITAMIN) tablet Take 1 tablet by mouth daily.      [name] omeprazole (PRILOSEC) 40 MG capsule TAKE 1 CAPSULE DAILY  90 capsule  1   No current facility-administered medications on file prior to visit.    Review of  Systems Patient denies any headache, lightheadedness or dizziness.  No significant sinus or allergy symptoms.  No chest pain, tightness or palpitations.  No increased shortness of breath.  Cough/congestion resolved.   No nausea or vomiting.  No acid reflux.  Omeprazole controls.  No abdominal pain or cramping.  No bowel change, such as diarrhea, constipation, BRBPR or melana.  No urine change.  Periods regular.  Blood pressure doing well.  On Losartan.  Off HCTZ.       Objective:   Physical Exam  Filed Vitals:   01/21/14 0907  BP: 120/80  Pulse: 75  Temp: 98.3 F (36.8 C)   Blood pressure recheck: 45/73  48 year old female in no acute distress.   HEENT:  Nares- clear.  Oropharynx - without lesions. NECK:  Supple.  Nontender.  No audible bruit.  HEART:  Appears to be regular. LUNGS:  No crackles or wheezing audible.  Respirations even and unlabored.  RADIAL PULSE:  Equal bilaterally. ABDOMEN:  Soft, nontender.  Bowel sounds present and normal.  No audible abdominal bruit.     EXTREMITIES:  No increased edema present.  DP pulses palpable and equal bilaterally.      FEET:  Without lesions.       Assessment & Plan:  HEALTH MAINTENANCE.  Physical 10/20/13. .  Mammogram 10/09/12.   Follow up mammogram ordered again today.

## 2014-01-22 ENCOUNTER — Encounter: Payer: Self-pay | Admitting: Internal Medicine

## 2014-01-22 NOTE — Assessment & Plan Note (Signed)
Has adjusted her diet and is exercising.  On metformin 500mg  q day.   Sugars better.  A1c just checked - 6.4.  Follow.

## 2014-01-22 NOTE — Assessment & Plan Note (Signed)
Remain on singulair, zyrtec and nasal sprays as she is doing.  Currently stable.

## 2014-01-22 NOTE — Assessment & Plan Note (Signed)
Symptoms controlled on omeprazole.  Follow.  

## 2014-01-22 NOTE — Assessment & Plan Note (Signed)
Resolved.  Not an issue now.

## 2014-01-22 NOTE — Assessment & Plan Note (Signed)
Off HCTZ.  Blood pressure doing well.  Would like to continue losartan for kidney protection.

## 2014-01-22 NOTE — Assessment & Plan Note (Signed)
Breathing doing well now.  Follow.   

## 2014-01-22 NOTE — Assessment & Plan Note (Signed)
Hemoglobin normal.  Still having periods.  Not heavy.  On multivitamin with iron.  Follow.

## 2014-02-01 ENCOUNTER — Other Ambulatory Visit: Payer: Self-pay | Admitting: Internal Medicine

## 2014-02-08 ENCOUNTER — Ambulatory Visit (INDEPENDENT_AMBULATORY_CARE_PROVIDER_SITE_OTHER): Payer: BC Managed Care – PPO | Admitting: Pulmonary Disease

## 2014-02-08 ENCOUNTER — Encounter: Payer: Self-pay | Admitting: Pulmonary Disease

## 2014-02-08 VITALS — BP 122/70 | HR 74 | Ht 62.0 in | Wt 196.0 lb

## 2014-02-08 DIAGNOSIS — J4520 Mild intermittent asthma, uncomplicated: Secondary | ICD-10-CM

## 2014-02-08 NOTE — Progress Notes (Signed)
Subjective:    Patient ID: Donna Hensley, female    DOB: [birth year], 48 y.o.   MRN: [id number]  Synopsis: Donna Hensley first saw Donna Hensley in 2015 for evaluation of wheezing after a viral illness. She underwent pulmonary function testing which was normal. She then underwent a methacholine challenge which demonstrated a positive airflow change at the highest level of methacholine (16 ng). Based on her history it sounds like she has mild intermittent asthma exacerbated only by viral bronchitis every few years.  HPI  Chief Complaint  Patient presents with  . Follow-up    Review methacholine challenge.  Pt has no breathing complaints today.     02/08/2014 ROV> She has been doing great, no steroid use since the last visit. She tells me that she has not had any trouble breathing. When she got over the episode of wheezing after her most recent cold in May 2015 she stopped using inhaled steroids. Overall she's feeling great. She is here today to followup on the results of her methacholine challenge test. She has not used albuterol since May 2015.  Past Medical History  Diagnosis Date  . Allergy   . GERD (gastroesophageal reflux disease)   . Asthma   . Gestational diabetes      Review of Systems     Objective:   Physical Exam Filed Vitals:   02/08/14 1526  BP: 122/70  Pulse: 74  Height: 5\' 2"  (1.575 m)  Weight: 196 lb (88.905 kg)  SpO2: 96%   Gen: well appearing, no acute distress HEENT: NCAT, EOMi, OP clear, PULM: CTA B CV: RRR, no mgr, no JVD AB: BS+, soft, nontender,  Ext: warm, no edema, no clubbing, no cyanosis Derm: no rash or skin breakdown Neuro: A&Ox4, moves all extremities well        Assessment & Plan:   Mild intermittent asthma in adult without complication Her full pulmonary function tests and followup by calling challenge test have been reviewed in detail today in clinic. Her methacholine challenge test was only positive at the highest level. I  explained to her that this could be a false positive. However, considering the fact that she has had repeated episodes of prolonged wheezing after viral illnesses over the years it sounds like she has mild intermittent asthma which is exacerbated by respiratory illnesses.  Plan: -Flu shot annually -I provided her with a prescription for Qvar to use on an as-needed basis in the future should she develop wheezing after respiratory illness -use albuterol as needed after respiratory illnesses -Followup with primary care physician or with me as needed    Updated Medication List Outpatient Encounter Prescriptions as of 02/08/2014  Medication Sig  . albuterol (PROVENTIL HFA;VENTOLIN HFA) 108 (90 BASE) MCG/ACT inhaler Inhale 2 puffs into the lungs every 6 (six) hours as needed for wheezing.  . cetirizine (ZYRTEC) 10 MG tablet Take 10 mg by mouth daily.  . fluticasone (FLONASE) 50 MCG/ACT nasal spray USE 2 SPRAYS NASALLY DAILY  . losartan (COZAAR) 25 MG tablet Take 1 tablet (25 mg total) by mouth daily.  . metFORMIN (GLUCOPHAGE) 500 MG tablet Take 1 tablet (500 mg total) by mouth daily.  . montelukast (SINGULAIR) 10 MG tablet TAKE 1 TABLET DAILY AT BEDTIME  . Multiple Vitamin (MULTIVITAMIN) tablet Take 1 tablet by mouth daily.  [name] omeprazole (PRILOSEC) 40 MG capsule TAKE 1 CAPSULE DAILY  . [DISCONTINUED] beclomethasone (QVAR) 80 MCG/ACT inhaler Inhale 2 puffs into the lungs 2 (two) times daily.

## 2014-02-08 NOTE — Patient Instructions (Signed)
If you have wheezing or cough after an infection, start using the QVar 48mcg 2 puffs twice a day and albuterol 2 puffs every four hours as needed for shortness of breath We will see you back as needed

## 2014-02-08 NOTE — Assessment & Plan Note (Signed)
Her full pulmonary function tests and followup by calling challenge test have been reviewed in detail today in clinic. Her methacholine challenge test was only positive at the highest level. I explained to her that this could be a false positive. However, considering the fact that she has had repeated episodes of prolonged wheezing after viral illnesses over the years it sounds like she has mild intermittent asthma which is exacerbated by respiratory illnesses.  Plan: -Flu shot annually -I provided her with a prescription for Qvar to use on an as-needed basis in the future should she develop wheezing after respiratory illness -use albuterol as needed after respiratory illnesses -Followup with primary care physician or with me as needed

## 2014-02-19 ENCOUNTER — Ambulatory Visit: Payer: Self-pay | Admitting: Internal Medicine

## 2014-02-19 ENCOUNTER — Encounter: Payer: Self-pay | Admitting: *Deleted

## 2014-02-19 LAB — HM MAMMOGRAPHY: HM Mammogram: NEGATIVE

## 2014-03-16 ENCOUNTER — Encounter: Payer: Self-pay | Admitting: Internal Medicine

## 2014-04-07 ENCOUNTER — Encounter: Payer: Self-pay | Admitting: Internal Medicine

## 2014-04-13 ENCOUNTER — Encounter: Payer: Self-pay | Admitting: Internal Medicine

## 2014-04-13 ENCOUNTER — Ambulatory Visit (INDEPENDENT_AMBULATORY_CARE_PROVIDER_SITE_OTHER): Payer: BC Managed Care – PPO | Admitting: Internal Medicine

## 2014-04-13 VITALS — BP 126/70 | HR 78 | Temp 98.4°F | Ht 62.0 in | Wt 197.8 lb

## 2014-04-13 DIAGNOSIS — J069 Acute upper respiratory infection, unspecified: Secondary | ICD-10-CM

## 2014-04-13 MED ORDER — HYDROCODONE-HOMATROPINE 5-1.5 MG/5ML PO SYRP
ORAL_SOLUTION | ORAL | Status: DC
Start: 2014-04-13 — End: 2015-02-04

## 2014-04-13 MED ORDER — PREDNISONE 10 MG PO TABS: ORAL_TABLET | ORAL | Status: DC

## 2014-04-13 MED ORDER — CEFUROXIME AXETIL 250 MG PO TABS: 250.0000 mg | ORAL_TABLET | Freq: Two times a day (BID) | ORAL | Status: DC

## 2014-04-13 NOTE — Progress Notes (Signed)
Pre visit review using our clinic review tool, if applicable. No additional management support is needed unless otherwise documented below in the visit note. 

## 2014-04-13 NOTE — Patient Instructions (Signed)
Saline nasal spray - flush nose at least 2-3x/day  flonase nasal spray in the evening as directed.    Mucinex DM in the am and robitussin DM in the evening.    Take the antibiotic and prednisone taper as directed.    Align - one per day while on abx and for two weeks after complete abx.

## 2014-04-13 NOTE — Telephone Encounter (Signed)
Spoke to patient to notify her of Dr. Bary Leriche comments. Patient verbalized understanding. Patient will come in today at 4:15pm for acute visit with Dr. Nicki Reaper.

## 2014-04-18 ENCOUNTER — Encounter: Payer: Self-pay | Admitting: Internal Medicine

## 2014-04-18 NOTE — Progress Notes (Signed)
  Subjective:    Patient ID: Donna Hensley, female    DOB: April 04, 1966, 48 y.o.   MRN: [id number]  Cough  48 year old female with past history of GERD, seasonal allergies and asthma who comes in today as a work in with concerns regarding increased cough, congestion and sinus pressure.   States symptoms started one week ago.  Increased cough - productive.  Increased sinus pressure.  Sore throat from coughing.  Feels some tightness in her chest.  Some wheezing.  Her husband and her daughter has been sick.  Taking mucinex DM.       Past Medical History  Diagnosis Date  . Allergy   . GERD (gastroesophageal reflux disease)   . Asthma   . Gestational diabetes     Current Outpatient Prescriptions on File Prior to Visit  Medication Sig Dispense Refill  . albuterol (PROVENTIL HFA;VENTOLIN HFA) 108 (90 BASE) MCG/ACT inhaler Inhale 2 puffs into the lungs every 6 (six) hours as needed for wheezing.    . cetirizine (ZYRTEC) 10 MG tablet Take 10 mg by mouth daily.    . fluticasone (FLONASE) 50 MCG/ACT nasal spray USE 2 SPRAYS NASALLY DAILY 16 g 5  . losartan (COZAAR) 25 MG tablet Take 1 tablet (25 mg total) by mouth daily. 90 tablet 3  . metFORMIN (GLUCOPHAGE) 500 MG tablet Take 1 tablet (500 mg total) by mouth daily. 90 tablet 3  . montelukast (SINGULAIR) 10 MG tablet TAKE 1 TABLET DAILY AT BEDTIME 90 tablet 1  . Multiple Vitamin (MULTIVITAMIN) tablet Take 1 tablet by mouth daily.    [name] omeprazole (PRILOSEC) 40 MG capsule TAKE 1 CAPSULE DAILY 90 capsule 1   No current facility-administered medications on file prior to visit.    Review of Systems  Respiratory: Positive for cough.   Patient denies any headache, lightheadedness or dizziness.  Increased sinus pressure and congestion.  Increased cough - productive.  Sore throat.  No acid reflux.  No nausea or vomiting. Taking mucinex DM.  Increased cough and wheezing.        Objective:   Physical Exam  Filed Vitals:   04/13/14 1638  BP:  126/70  Pulse: 78  Temp: 98.4 F (16.57 C)   48  year old female in no acute distress.   HEENT:  Nares- erythematous turbinages.  Oropharynx - without lesions.  Minimal tenderness to palpation over the sinuses.  NECK:  Supple.  Nontender.  N HEART:  Appears to be regular. LUNGS:  No crackles.  Increased cough with forced expiration.      Assessment & Plan:  1. URI (upper respiratory infection) Cough and congestion as outlined.  Ceftin as directed.  Saline nasal spray and nasacort nasal spray as directed.  Prednisoone taper starting at 60mg .  Albuterol inhaler as directed.  Follow.  Notify me or be reevaluated if symptoms persist or worsen.   HEALTH MAINTENANCE.  Physical 10/20/13. .  Mammogram 02/19/14 - Birads I.

## 2014-04-21 ENCOUNTER — Encounter: Payer: Self-pay | Admitting: Internal Medicine

## 2014-04-21 MED ORDER — FLUCONAZOLE 150 MG PO TABS: 150.0000 mg | ORAL_TABLET | Freq: Once | ORAL | Status: DC

## 2014-04-21 NOTE — Telephone Encounter (Signed)
rx sent for Diflucan x 1.  See my chart message.

## 2014-05-04 MED ORDER — ALBUTEROL SULFATE HFA 108 (90 BASE) MCG/ACT IN AERS: 2.0000 | INHALATION_SPRAY | Freq: Four times a day (QID) | RESPIRATORY_TRACT | Status: DC | PRN

## 2014-05-04 NOTE — Telephone Encounter (Signed)
rx sent in for albuterol inhaler with 2 refills.

## 2014-05-04 NOTE — Addendum Note (Signed)
Addended by: Alisa Graff on: 05/04/2014 04:46 AM   Modules accepted: Orders

## 2014-05-06 ENCOUNTER — Encounter: Payer: Self-pay | Admitting: Internal Medicine

## 2014-05-06 ENCOUNTER — Ambulatory Visit (INDEPENDENT_AMBULATORY_CARE_PROVIDER_SITE_OTHER): Payer: BLUE CROSS/BLUE SHIELD | Admitting: Internal Medicine

## 2014-05-06 ENCOUNTER — Telehealth: Payer: Self-pay

## 2014-05-06 VITALS — BP 120/74 | HR 83 | Temp 98.5°F | Resp 14 | Ht 62.0 in | Wt 204.0 lb

## 2014-05-06 DIAGNOSIS — J069 Acute upper respiratory infection, unspecified: Secondary | ICD-10-CM

## 2014-05-06 DIAGNOSIS — J4520 Mild intermittent asthma, uncomplicated: Secondary | ICD-10-CM

## 2014-05-06 MED ORDER — CEFDINIR 300 MG PO CAPS: 300.0000 mg | ORAL_CAPSULE | Freq: Two times a day (BID) | ORAL | Status: DC

## 2014-05-06 MED ORDER — PREDNISONE 10 MG PO TABS: ORAL_TABLET | ORAL | Status: DC

## 2014-05-06 MED ORDER — BECLOMETHASONE DIPROPIONATE 80 MCG/ACT IN AERS: 2.0000 | INHALATION_SPRAY | Freq: Two times a day (BID) | RESPIRATORY_TRACT | Status: DC

## 2014-05-06 NOTE — Progress Notes (Signed)
Pre visit review using our clinic review tool, if applicable. No additional management support is needed unless otherwise documented below in the visit note. 

## 2014-05-06 NOTE — Telephone Encounter (Signed)
The patient called and is hoping to be worked in for cold symptoms. She is hoping to see Dr.Scott.  Do you want her added into your schedule? If so, where?

## 2014-05-06 NOTE — Patient Instructions (Signed)
Saline nasal spray - flush nose at least 2-3x/day  nasacort nasal spray - 2 sprays each nostril one time per day.  Do this in the evening.    Robitussin as directed.

## 2014-05-06 NOTE — Telephone Encounter (Signed)
See if she can come in at 12:00 today.  Work in.

## 2014-05-06 NOTE — Telephone Encounter (Signed)
Pt seent today in the office.

## 2014-05-06 NOTE — Telephone Encounter (Signed)
Please advise 

## 2014-05-06 NOTE — Telephone Encounter (Signed)
Pt aware and scheduled.

## 2014-05-06 NOTE — Telephone Encounter (Signed)
Duplicate. See previous note.

## 2014-05-06 NOTE — Telephone Encounter (Signed)
Noted  

## 2014-05-09 ENCOUNTER — Encounter: Payer: Self-pay | Admitting: Internal Medicine

## 2014-05-09 NOTE — Progress Notes (Signed)
Subjective:    Patient ID: Donna Hensley, female    DOB: 13-Feb-1966, 49 y.o.   MRN: [id number]  Cough  49 year old female with past history of GERD, seasonal allergies and asthma who comes in today as a work in with concerns regarding increased cough, congestion and sinus pressure.   States symptoms have been going on for a while.  She was seen at an Urgent Care.  Treated with abx and prednisone.  Symptoms improved.  For a while, she just had some residual cough.  Over the last couple of days, she has noticed some increased sinus pressure.  Yellow mucus production.  No sore throat.  Some wheezing.  Increased cough.   Her husband and daughter have been sick.  No vomiting or diarrhea.        Past Medical History  Diagnosis Date  . Allergy   . GERD (gastroesophageal reflux disease)   . Asthma   . Gestational diabetes     Current Outpatient Prescriptions on File Prior to Visit  Medication Sig Dispense Refill  . albuterol (PROVENTIL HFA;VENTOLIN HFA) 108 (90 BASE) MCG/ACT inhaler Inhale 2 puffs into the lungs every 6 (six) hours as needed for wheezing. 6.7 g 2  . cetirizine (ZYRTEC) 10 MG tablet Take 10 mg by mouth daily.    . fluticasone (FLONASE) 50 MCG/ACT nasal spray USE 2 SPRAYS NASALLY DAILY 16 g 5  . HYDROcodone-homatropine (HYDROMET) 5-1.5 MG/5ML syrup 1/2 - 1 teaspoon q hs prn 120 mL 0  . losartan (COZAAR) 25 MG tablet Take 1 tablet (25 mg total) by mouth daily. 90 tablet 3  . metFORMIN (GLUCOPHAGE) 500 MG tablet Take 1 tablet (500 mg total) by mouth daily. 90 tablet 3  . montelukast (SINGULAIR) 10 MG tablet TAKE 1 TABLET DAILY AT BEDTIME 90 tablet 1  . Multiple Vitamin (MULTIVITAMIN) tablet Take 1 tablet by mouth daily.    [name] omeprazole (PRILOSEC) 40 MG capsule TAKE 1 CAPSULE DAILY 90 capsule 1   No current facility-administered medications on file prior to visit.    Review of Systems  Respiratory: Positive for cough.   Patient denies any headache, lightheadedness or  dizziness.  Increased sinus pressure and congestion.  Increased cough.  No sore throat.  No acid reflux.  No nausea or vomiting.  Was taking mucinex DM.  Some wheezing.  Just started on steroid inhaler.        Objective:   Physical Exam  Filed Vitals:   05/06/14 1210  BP: 120/74  Pulse: 83  Temp: 98.5 F (36.9 C)  Resp: 93   49  year old female in no acute distress.   HEENT:  Nares- erythematous turbinages.  Oropharynx - without lesions.  Minimal tenderness to palpation over the sinuses.  NECK:  Supple.  Nontender.  HEART:  Appears to be regular. LUNGS:  No crackles.  Increased cough with forced expiration.  No significant wheezing on exam.       Assessment & Plan:  1. URI (upper respiratory infection)/possible sinus infection.  Symptoms as outlined.  Treat with omnicef as directed.  Saline nasal spray and nasacort nasal spray as directed.  Prednisone taper starting as directed.  Qvar and albuterol inhaler as directed.  Rest.  Fluids.  Explained to her if symptoms changed, worsened or do not resolve.    2. Asthma, mild intermittent, uncomplicated Treat infection.  Prednisone taper.  Inhalers as directed.   HEALTH MAINTENANCE.  Physical 10/20/13. .  Mammogram 02/19/14 - Birads I.

## 2014-05-20 ENCOUNTER — Encounter: Payer: Self-pay | Admitting: Internal Medicine

## 2014-05-20 MED ORDER — FLUCONAZOLE 150 MG PO TABS: 150.0000 mg | ORAL_TABLET | Freq: Once | ORAL | Status: DC

## 2014-05-20 NOTE — Telephone Encounter (Signed)
rx sent in for diflucan x 1.  Pt notified via my chart.

## 2014-05-24 ENCOUNTER — Ambulatory Visit: Payer: BC Managed Care – PPO | Admitting: Internal Medicine

## 2014-06-16 ENCOUNTER — Ambulatory Visit: Payer: Self-pay | Admitting: Internal Medicine

## 2014-06-22 ENCOUNTER — Other Ambulatory Visit: Payer: Self-pay | Admitting: Internal Medicine

## 2014-07-18 ENCOUNTER — Other Ambulatory Visit: Payer: Self-pay | Admitting: Internal Medicine

## 2014-08-16 ENCOUNTER — Ambulatory Visit: Payer: Self-pay | Admitting: Internal Medicine

## 2014-10-04 ENCOUNTER — Other Ambulatory Visit: Payer: Self-pay | Admitting: Internal Medicine

## 2014-10-14 ENCOUNTER — Ambulatory Visit: Payer: Self-pay | Admitting: Internal Medicine

## 2014-12-18 ENCOUNTER — Other Ambulatory Visit: Payer: Self-pay | Admitting: Internal Medicine

## 2015-01-14 ENCOUNTER — Other Ambulatory Visit: Payer: Self-pay | Admitting: Internal Medicine

## 2015-01-14 NOTE — Telephone Encounter (Signed)
Last OV was 05/06/14, has cancelled several since then. Please advise refill?

## 2015-01-14 NOTE — Telephone Encounter (Signed)
rx ok'd for singulair #90 with one refill.

## 2015-02-04 ENCOUNTER — Ambulatory Visit (INDEPENDENT_AMBULATORY_CARE_PROVIDER_SITE_OTHER): Payer: BLUE CROSS/BLUE SHIELD | Admitting: Internal Medicine

## 2015-02-04 ENCOUNTER — Encounter: Payer: Self-pay | Admitting: Internal Medicine

## 2015-02-04 VITALS — BP 118/80 | HR 92 | Temp 98.3°F | Resp 18 | Ht 62.25 in | Wt 209.2 lb

## 2015-02-04 DIAGNOSIS — Z0000 Encounter for general adult medical examination without abnormal findings: Secondary | ICD-10-CM

## 2015-02-04 DIAGNOSIS — E611 Iron deficiency: Secondary | ICD-10-CM

## 2015-02-04 DIAGNOSIS — K219 Gastro-esophageal reflux disease without esophagitis: Secondary | ICD-10-CM

## 2015-02-04 DIAGNOSIS — Z13220 Encounter for screening for lipoid disorders: Secondary | ICD-10-CM

## 2015-02-04 DIAGNOSIS — E119 Type 2 diabetes mellitus without complications: Secondary | ICD-10-CM | POA: Diagnosis not present

## 2015-02-04 DIAGNOSIS — J4520 Mild intermittent asthma, uncomplicated: Secondary | ICD-10-CM | POA: Diagnosis not present

## 2015-02-04 DIAGNOSIS — I10 Essential (primary) hypertension: Secondary | ICD-10-CM

## 2015-02-04 DIAGNOSIS — Z1239 Encounter for other screening for malignant neoplasm of breast: Secondary | ICD-10-CM | POA: Diagnosis not present

## 2015-02-04 NOTE — Progress Notes (Signed)
Pre-visit discussion using our clinic review tool. No additional management support is needed unless otherwise documented below in the visit note.  

## 2015-02-04 NOTE — Progress Notes (Signed)
Patient ID: Donna Hensley, female   DOB: 1965/07/31, 49 y.o.   MRN: [id number]   Subjective:    Patient ID: Donna Hensley, female    DOB: [birth year], 49 y.o.   MRN: [id number]  HPI  Patient with past history of asthma, diabetes and hypertension who comes in today to follow up on these issues as well as for a complete physical exam.  She has been under increased stress recently.  Has had multiple weddings, two funerals, etc.  Feels she is handling stress relatively well.  She has been out of her routine with her diet and exercise.  She is going to the gym.  Breathing is stable.  No sob.  No acid reflux.  No abdominal pain or cramping.  Bowels stable.     Past Medical History  Diagnosis Date  . Allergy   . GERD (gastroesophageal reflux disease)   . Asthma   . Gestational diabetes    Past Surgical History  Procedure Laterality Date  . Appendectomy  1986  . Tonsillectomy  1987  . Cholecystectomy  2013  . Cesarean section  1996 & 2000   Family History  Problem Relation Age of Onset  . Hyperlipidemia Mother   . Osteoporosis Mother   . Hypertension Father   . Arthritis Father   . Sleep apnea Father   . Heart disease Father   . Sleep apnea Brother   . Arthritis Paternal Grandmother   . Diabetes Paternal Grandmother    Social History   Social History  . Marital Status: Married    Spouse Name: N/A  . Number of Children: 2  . 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 Smoking status: Former Smoker -- 0.25 packs/day for 5 years    Types: Cigarettes    Quit date: 09/28/1988  . Smokeless tobacco: Never Used     Comment: smoked briefly as a teenager  . Alcohol Use: 0.0 oz/week    0 Standard drinks or equivalent per week     Comment: Occasional  . Drug Use: No  . Sexual Activity: Not Asked   Other Topics Concern  . None   Social History Narrative   Regular exercise-yes, walk   Diet: fast food, trying to improve diet           Outpatient Encounter Prescriptions as of 02/04/2015  Medication Sig  . albuterol (PROVENTIL HFA;VENTOLIN HFA) 108 (90 BASE) MCG/ACT inhaler Inhale 2 puffs into the lungs every 6 (six) hours as needed for wheezing.  . cetirizine (ZYRTEC) 10 MG tablet Take 10 mg by mouth daily.  . fluticasone (FLONASE) 50 MCG/ACT nasal spray USE 2 SPRAYS NASALLY DAILY  . losartan (COZAAR) 25 MG tablet TAKE 1 TABLET DAILY  . metFORMIN (GLUCOPHAGE) 500 MG tablet TAKE 1 TABLET DAILY  . montelukast (SINGULAIR) 10 MG tablet TAKE 1 TABLET DAILY AT BEDTIME  . Multiple Vitamin (MULTIVITAMIN) tablet Take 1 tablet by mouth daily.  [name] omeprazole (PRILOSEC) 40 MG capsule TAKE 1 CAPSULE DAILY  . [DISCONTINUED] fluconazole (DIFLUCAN) 150 MG tablet Take 1 tablet (150 mg total) by mouth once.  . [DISCONTINUED] HYDROcodone-homatropine (HYDROMET) 5-1.5 MG/5ML syrup 1/2 - 1 teaspoon q hs prn  . beclomethasone (QVAR) 80 MCG/ACT inhaler Inhale 2 puffs into the lungs 2 (two) times daily. (Patient not taking: Reported on 02/04/2015)  . [DISCONTINUED] cefdinir (OMNICEF) 300 MG capsule Take 1 capsule (300 mg total) by mouth 2 (two)  times daily.  . [DISCONTINUED] predniSONE (DELTASONE) 10 MG tablet Take 6 tablets x 1 day and then decrease by 1/2 tablet per day until down to zero mg.   No facility-administered encounter medications on file as of 02/04/2015.    Review of Systems  Constitutional: Negative for appetite change and unexpected weight change.       Out of routine.  Not watching her diet.  Is exercising.    HENT: Negative for congestion and sinus pressure.   Eyes: Negative for pain and visual disturbance.  Respiratory: Negative for cough, chest tightness and shortness of breath.   Cardiovascular: Negative for chest pain, palpitations and leg swelling.  Gastrointestinal: Negative for nausea, vomiting, abdominal pain and diarrhea.  Genitourinary: Negative for dysuria and difficulty urinating.  Musculoskeletal:  Negative for back pain and joint swelling.  Skin: Negative for color change and rash.  Neurological: Negative for dizziness, light-headedness and headaches.  Hematological: Negative for adenopathy. Does not bruise/bleed easily.  Psychiatric/Behavioral: Negative for dysphoric mood and agitation.       Increased stress.  She feels she is doing relatively well.  Feels handling things relatively well.         Objective:     Blood pressure rechecked by me:  120/82  Physical Exam  Constitutional: She is oriented to person, place, and time. She appears well-developed and well-nourished. No distress.  HENT:  Nose: Nose normal.  Mouth/Throat: Oropharynx is clear and moist.  Eyes: Right eye exhibits no discharge. Left eye exhibits no discharge. No scleral icterus.  Neck: Neck supple. No thyromegaly present.  Cardiovascular: Normal rate and regular rhythm.   Pulmonary/Chest: Breath sounds normal. No accessory muscle usage. No tachypnea. No respiratory distress. She has no decreased breath sounds. She has no wheezes. She has no rhonchi. Right breast exhibits no inverted nipple, no mass, no nipple discharge and no tenderness (no axillary adenopathy). Left breast exhibits no inverted nipple, no mass, no nipple discharge and no tenderness (no axilarry adenopathy).  Abdominal: Soft. Bowel sounds are normal. There is no tenderness.  Musculoskeletal: She exhibits no edema or tenderness.  Lymphadenopathy:    She has no cervical adenopathy.  Neurological: She is alert and oriented to person, place, and time.  Skin: Skin is warm. No rash noted. No erythema.  Psychiatric: She has a normal mood and affect. Her behavior is normal.    BP 118/80 mmHg  Pulse 92  Temp(Src) 98.3 F (36.8 C) (Oral)  Resp 18  Ht 5' 2.25" (1.581 m)  Wt 209 lb 4 oz (94.915 kg)  BMI 37.97 kg/m2  SpO2 95%  LMP 01/16/2015 (Exact Date) Wt Readings from Last 3 Encounters:  02/04/15 209 lb 4 oz (94.915 kg)  05/06/14 204 lb  (92.534 kg)  04/13/14 197 lb 12 oz (89.699 kg)     Lab Results  Component Value Date   WBC 8.6 01/19/2014   HGB 13.1 01/19/2014   HCT 40.4 01/19/2014   PLT 311.0 01/19/2014   GLUCOSE 104* 01/19/2014   CHOL 152 01/19/2014   TRIG 89.0 01/19/2014   HDL 41.90 01/19/2014   LDLCALC 92 01/19/2014   ALT 33 01/19/2014   AST 30 01/19/2014   NA 138 01/19/2014   K 3.9 01/19/2014   CL 107 01/19/2014   CREATININE 1.0 01/19/2014   BUN 13 01/19/2014   CO2 20 01/19/2014   TSH 1.63 08/06/2013   HGBA1C 6.4 01/19/2014   MICROALBUR 1.1 01/19/2014       Assessment & Plan:  Problem List Items Addressed This Visit    Diabetes ([place])    Discussed diet and exercise.  Follow met b and a1c.  Low carb diet.   Lab Results  Component Value Date   HGBA1C 6.4 01/19/2014        Relevant Orders   TSH   Hepatic function panel   Basic metabolic panel   Microalbumin / creatinine urine ratio   Hemoglobin A1c   Essential hypertension, benign    Blood pressure under good control.  Continue same medication regimen.  Follow pressures.  Follow metabolic panel.        GERD    On omeprazole.  If watches her diet and takes her medication - no problems.        Health care maintenance    Physical today.  Mammogram 02/19/14 - Birads I.  Schedule f/u mammogram.  PAP 10/02/12 - negative with negative HPV.       Iron deficiency    LMP two weeks ago.  History of heavy periods.  Check cbc and ferritin.        Relevant Orders   CBC with Differential/Platelet   Ferritin   Mild intermittent asthma in adult without complication    Breathing stable.  Follow.  No sob.        Other Visit Diagnoses    Breast cancer screening    -  Primary    Relevant Orders    MM DIGITAL SCREENING BILATERAL    Screening cholesterol level        Relevant Orders    Lipid panel        Einar Pheasant, MD

## 2015-02-05 ENCOUNTER — Encounter: Payer: Self-pay | Admitting: Internal Medicine

## 2015-02-05 DIAGNOSIS — Z0000 Encounter for general adult medical examination without abnormal findings: Secondary | ICD-10-CM | POA: Insufficient documentation

## 2015-02-05 NOTE — Assessment & Plan Note (Signed)
Blood pressure under good control.  Continue same medication regimen.  Follow pressures.  Follow metabolic panel.   

## 2015-02-05 NOTE — Assessment & Plan Note (Addendum)
Physical today.  Mammogram 02/19/14 - Birads I.  Schedule f/u mammogram.  PAP 10/02/12 - negative with negative HPV.

## 2015-02-05 NOTE — Assessment & Plan Note (Signed)
Breathing stable.  Follow.  No sob.

## 2015-02-05 NOTE — Assessment & Plan Note (Signed)
Discussed diet and exercise.  Follow met b and a1c.  Low carb diet.   Lab Results  Component Value Date   HGBA1C 6.4 01/19/2014

## 2015-02-05 NOTE — Assessment & Plan Note (Signed)
On omeprazole.  If watches her diet and takes her medication - no problems.

## 2015-02-05 NOTE — Assessment & Plan Note (Signed)
LMP two weeks ago.  History of heavy periods.  Check cbc and ferritin.

## 2015-02-15 ENCOUNTER — Other Ambulatory Visit (INDEPENDENT_AMBULATORY_CARE_PROVIDER_SITE_OTHER): Payer: BLUE CROSS/BLUE SHIELD

## 2015-02-15 DIAGNOSIS — Z13220 Encounter for screening for lipoid disorders: Secondary | ICD-10-CM

## 2015-02-15 DIAGNOSIS — E611 Iron deficiency: Secondary | ICD-10-CM

## 2015-02-15 DIAGNOSIS — E119 Type 2 diabetes mellitus without complications: Secondary | ICD-10-CM

## 2015-02-15 LAB — BASIC METABOLIC PANEL
BUN: 12 mg/dL (ref 6–23)
CO2: 25 mEq/L (ref 19–32)
Calcium: 9.5 mg/dL (ref 8.4–10.5)
Chloride: 104 mEq/L (ref 96–112)
Creatinine, Ser: 0.88 mg/dL (ref 0.40–1.20)
GFR: 73.25 mL/min (ref >60.00)
Glucose, Bld: 108 mg/dL — ABNORMAL HIGH (ref 70–99)
Potassium: 4.1 mEq/L (ref 3.5–5.1)
Sodium: 138 mEq/L (ref 135–145)

## 2015-02-15 LAB — CBC WITH DIFFERENTIAL/PLATELET
Basophils Absolute: 0.1 10*3/uL (ref 0.0–0.1)
Basophils Relative: 0.6 % (ref 0.0–3.0)
Eosinophils Absolute: 0.3 10*3/uL (ref 0.0–0.7)
Eosinophils Relative: 3.5 % (ref 0.0–5.0)
HCT: 40.8 % (ref 36.0–46.0)
Hemoglobin: 13.1 g/dL (ref 12.0–15.0)
Lymphocytes Relative: 29.2 % (ref 12.0–46.0)
Lymphs Abs: 2.4 10*3/uL (ref 0.7–4.0)
MCHC: 32 g/dL (ref 30.0–36.0)
MCV: 74.6 fl — ABNORMAL LOW (ref 78.0–100.0)
Monocytes Absolute: 0.5 10*3/uL (ref 0.1–1.0)
Monocytes Relative: 6.4 % (ref 3.0–12.0)
Neutro Abs: 5 10*3/uL (ref 1.4–7.7)
Neutrophils Relative %: 60.3 % (ref 43.0–77.0)
Platelets: 329 10*3/uL (ref 150.0–400.0)
RBC: 5.47 Mil/uL — ABNORMAL HIGH (ref 3.87–5.11)
RDW: 17.6 % — ABNORMAL HIGH (ref 11.5–15.5)
WBC: 8.4 10*3/uL (ref 4.0–10.5)

## 2015-02-15 LAB — HEMOGLOBIN A1C: Hgb A1c MFr Bld: 6.7 % — ABNORMAL HIGH (ref 4.6–6.5)

## 2015-02-15 LAB — LIPID PANEL
Cholesterol: 160 mg/dL (ref 0–200)
HDL: 51 mg/dL (ref >39.00)
LDL Cholesterol: 91 mg/dL (ref 0–99)
NonHDL: 108.9
Total CHOL/HDL Ratio: 3
Triglycerides: 89 mg/dL (ref 0.0–149.0)
VLDL: 17.8 mg/dL (ref 0.0–40.0)

## 2015-02-15 LAB — MICROALBUMIN / CREATININE URINE RATIO
Creatinine,U: 278.5 mg/dL
Microalb Creat Ratio: 1 mg/g (ref 0.0–30.0)
Microalb, Ur: 2.7 mg/dL — ABNORMAL HIGH (ref 0.0–1.9)

## 2015-02-15 LAB — TSH: TSH: 0.74 u[IU]/mL (ref 0.35–4.50)

## 2015-02-15 LAB — HEPATIC FUNCTION PANEL
ALT: 25 U/L (ref 0–35)
AST: 22 U/L (ref 0–37)
Albumin: 4.1 g/dL (ref 3.5–5.2)
Alkaline Phosphatase: 103 U/L (ref 39–117)
Bilirubin, Direct: 0.2 mg/dL (ref 0.0–0.3)
Total Bilirubin: 0.8 mg/dL (ref 0.2–1.2)
Total Protein: 7.3 g/dL (ref 6.0–8.3)

## 2015-02-15 LAB — FERRITIN: Ferritin: 4.7 ng/mL — ABNORMAL LOW (ref 10.0–291.0)

## 2015-02-16 ENCOUNTER — Encounter: Payer: Self-pay | Admitting: Internal Medicine

## 2015-02-18 NOTE — Telephone Encounter (Signed)
Unread mychart message mailed to patient 

## 2015-02-23 ENCOUNTER — Ambulatory Visit
Admission: RE | Admit: 2015-02-23 | Discharge: 2015-02-23 | Disposition: A | Discharge status: Home / Self Care | Payer: BLUE CROSS/BLUE SHIELD | Source: Ambulatory Visit | Attending: Internal Medicine | Admitting: Internal Medicine

## 2015-02-23 DIAGNOSIS — Z1239 Encounter for other screening for malignant neoplasm of breast: Secondary | ICD-10-CM

## 2015-02-23 DIAGNOSIS — Z1231 Encounter for screening mammogram for malignant neoplasm of breast: Secondary | ICD-10-CM | POA: Insufficient documentation

## 2015-03-16 ENCOUNTER — Other Ambulatory Visit: Payer: Self-pay | Admitting: Internal Medicine

## 2015-04-01 ENCOUNTER — Other Ambulatory Visit: Payer: Self-pay | Admitting: Internal Medicine

## 2015-04-18 ENCOUNTER — Encounter: Payer: Self-pay | Admitting: Family Medicine

## 2015-04-18 ENCOUNTER — Other Ambulatory Visit: Payer: Self-pay

## 2015-04-18 ENCOUNTER — Telehealth: Payer: Self-pay | Admitting: Internal Medicine

## 2015-04-18 ENCOUNTER — Ambulatory Visit (INDEPENDENT_AMBULATORY_CARE_PROVIDER_SITE_OTHER): Payer: BLUE CROSS/BLUE SHIELD | Admitting: Family Medicine

## 2015-04-18 VITALS — BP 130/98 | HR 89 | Temp 98.1°F | Ht 62.0 in | Wt 205.0 lb

## 2015-04-18 DIAGNOSIS — J988 Other specified respiratory disorders: Secondary | ICD-10-CM | POA: Insufficient documentation

## 2015-04-18 LAB — HM DIABETES EYE EXAM

## 2015-04-18 MED ORDER — PREDNISONE 50 MG PO TABS: ORAL_TABLET | ORAL | Status: DC

## 2015-04-18 MED ORDER — HYDROCOD POLST-CPM POLST ER 10-8 MG/5ML PO SUER: 5.0000 mL | Freq: Two times a day (BID) | ORAL | Status: DC | PRN

## 2015-04-18 NOTE — Assessment & Plan Note (Signed)
New problem. Likely viral in origin. Treating cough and congestion with Tussionex and Prednisone (especially in setting of asthma). Return precautions given.

## 2015-04-18 NOTE — Telephone Encounter (Signed)
Rx's sent into correct pharmacy. Patient notified.

## 2015-04-18 NOTE — Progress Notes (Signed)
Subjective:  Patient ID: Donna Hensley, female    DOB: Jun 29, 1965  Age: 49 y.o. MRN: [id number]  CC: Cold/Congestion  HPI:  49 year old female with a past medical history of asthma, HTN, and DM-2 presents to the clinic today with complaints of cold & chest congestion.  Cold/Chest congestion  Patient reports that she's had a "head cold" for approximately 1 week.  She states that this is characterized by nasal congestion.  She states that she knows feels that the congestion has settled in her chest.  She reports associated cough which is mildly productive.  No associated shortness of breath.  No associated fever.  She's been taking her home medications with little relief.  No known exacerbating factors.    Social Hx   Social History   Social History  . Marital Status: Married    Spouse Name: N/A  . Number of Children: 2  . 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 Smoking status: Former Smoker -- 0.25 packs/day for 5 years    Types: Cigarettes    Quit date: 09/28/1988  . Smokeless tobacco: Never Used     Comment: smoked briefly as a teenager  . Alcohol Use: 0.0 oz/week    0 Standard drinks or equivalent per week     Comment: Occasional  . Drug Use: No  . Sexual Activity: Not Asked   Other Topics Concern  . None   Social History Narrative   Regular exercise-yes, walk   Diet: fast food, trying to improve diet         Review of Systems  Constitutional: Negative for fever.  HENT: Positive for congestion and sore throat.   Respiratory: Positive for cough. Negative for shortness of breath.    Objective:  BP 130/98 mmHg  Pulse 89  Temp(Src) 98.1 F (36.7 C) (Oral)  Ht 5\' 2"  (1.575 m)  Wt 205 lb (92.987 kg)  BMI 37.49 kg/m2  SpO2 97%  LMP 03/27/2015  BP/Weight 04/18/2015 [id number]  Systolic BP [id number]  Diastolic BP 98 80 74  Wt. (Lbs) 205 209.25 204  BMI 37.49 37.97 37.3    Physical Exam  Constitutional: She is oriented to person, place, and time. She appears well-developed. No distress.  HENT:  Head: Normocephalic and atraumatic.  Right Ear: External ear normal.  Left Ear: External ear normal.  Nose: Mucosal edema present.  Mouth/Throat: Oropharynx is clear and moist.  Normal TM's bilaterally.   Eyes: Conjunctivae are normal.  Neck: Neck supple.  Cardiovascular: Normal rate and regular rhythm.   No murmur heard. Pulmonary/Chest: Effort normal and breath sounds normal. No respiratory distress. She has no wheezes. She has no rales.  Lymphadenopathy:    She has no cervical adenopathy.  Neurological: She is alert and oriented to person, place, and time.  Psychiatric: She has a normal mood and affect.  Vitals reviewed.  Lab Results  Component Value Date   WBC 8.4 02/15/2015   HGB 13.1 02/15/2015   HCT 40.8 02/15/2015   PLT 329.0 02/15/2015   GLUCOSE 108* 02/15/2015   CHOL 160 02/15/2015   TRIG 89.0 02/15/2015   HDL 51.00 02/15/2015   LDLCALC 91 02/15/2015   ALT 25 02/15/2015   AST 22 02/15/2015   NA 138 02/15/2015   K 4.1 02/15/2015   CL 104 02/15/2015   CREATININE 0.88 02/15/2015   BUN 12 02/15/2015   CO2 25 02/15/2015  TSH 0.74 02/15/2015   HGBA1C 6.7* 02/15/2015   MICROALBUR 2.7* 02/15/2015    Assessment & Plan:   Problem List Items Addressed This Visit    Respiratory tract infection - Primary    New problem. Likely viral in origin. Treating cough and congestion with Tussionex and Prednisone (especially in setting of asthma). Return precautions given.         Meds ordered this encounter  Medications  . predniSONE (DELTASONE) 50 MG tablet    Sig: 1 tablet daily x 5 days.    Dispense:  5 tablet    Refill:  0  . chlorpheniramine-HYDROcodone (TUSSIONEX PENNKINETIC ER) 10-8 MG/5ML SUER    Sig: Take 5 mLs by mouth every 12 (twelve) hours as needed.    Dispense:  115 mL    Refill:  0    Follow-up: Return if symptoms  worsen or fail to improve.  [place]

## 2015-04-18 NOTE — Progress Notes (Signed)
Pre visit review using our clinic review tool, if applicable. No additional management support is needed unless otherwise documented below in the visit note. 

## 2015-04-18 NOTE — Patient Instructions (Signed)
It was nice to see you today.  This is viral in origin.  Take the prednisone and use the cough syrup as needed.  Call if you worsen or fail to improve.   Follow up:  Return if symptoms worsen or fail to improve.  Take care  Dr. Lacinda Axon

## 2015-04-18 NOTE — Telephone Encounter (Signed)
Pt was in today to see Dr. Lacinda Axon. Her prescription was sent to [facility] in [place] said they did not receive the prescription. Please send prescription to [facility] in [place]. Pt does not use [facility].  Thank you.

## 2015-04-20 ENCOUNTER — Encounter: Payer: Self-pay | Admitting: Internal Medicine

## 2015-04-26 ENCOUNTER — Telehealth: Payer: Self-pay | Admitting: Internal Medicine

## 2015-04-26 NOTE — Telephone Encounter (Signed)
Please call pt and see if she can come in at 1:00 tomorrow for work in appt.  Thanks.

## 2015-04-26 NOTE — Telephone Encounter (Signed)
Called and lm to see if pt could come in 12/28 @1pm .. Per Dr. Nicki Reaper

## 2015-04-26 NOTE — Telephone Encounter (Signed)
Called pt to schedule appt for 12/27 @ 10.[name]

## 2015-04-27 ENCOUNTER — Encounter: Payer: Self-pay | Admitting: Internal Medicine

## 2015-04-27 ENCOUNTER — Ambulatory Visit (INDEPENDENT_AMBULATORY_CARE_PROVIDER_SITE_OTHER): Payer: BLUE CROSS/BLUE SHIELD | Admitting: Internal Medicine

## 2015-04-27 VITALS — BP 136/84 | HR 81 | Temp 98.0°F | Ht 62.0 in | Wt 206.0 lb

## 2015-04-27 DIAGNOSIS — I10 Essential (primary) hypertension: Secondary | ICD-10-CM | POA: Diagnosis not present

## 2015-04-27 DIAGNOSIS — J988 Other specified respiratory disorders: Secondary | ICD-10-CM

## 2015-04-27 DIAGNOSIS — J4520 Mild intermittent asthma, uncomplicated: Secondary | ICD-10-CM

## 2015-04-27 MED ORDER — CEFDINIR 300 MG PO CAPS: 300.0000 mg | ORAL_CAPSULE | Freq: Two times a day (BID) | ORAL | Status: DC

## 2015-04-27 MED ORDER — PREDNISONE 10 MG PO TABS: ORAL_TABLET | ORAL | Status: DC

## 2015-04-27 NOTE — Progress Notes (Signed)
Pre visit review using our clinic review tool, if applicable. No additional management support is needed unless otherwise documented below in the visit note. 

## 2015-04-27 NOTE — Patient Instructions (Signed)
Saline nasal spray - flush nose at least 2-3x/day  nasacort nasal spray - 2 sprays each nostril one time per day.  Do this in the evening.    mucinex in the am and robitussin in the evening.    Continue Qvar twice a day.    Albuterol inhaler if needed.    Prednisone taper as directed.

## 2015-04-28 ENCOUNTER — Encounter: Payer: Self-pay | Admitting: Internal Medicine

## 2015-04-28 DIAGNOSIS — I10 Essential (primary) hypertension: Secondary | ICD-10-CM | POA: Insufficient documentation

## 2015-04-28 MED ORDER — FIRST-DUKES MOUTHWASH MT SUSP: OROMUCOSAL | Status: DC

## 2015-04-28 NOTE — Assessment & Plan Note (Signed)
Blood pressure on recheck improved.  Same medication regimen.  Follow pressures.   

## 2015-04-28 NOTE — Assessment & Plan Note (Signed)
Treat current flare as outlined.  Follow.

## 2015-04-28 NOTE — Assessment & Plan Note (Signed)
Symptoms and exam as outlined.  Concern over progression.  Symptoms c/w possible sinus infection and respiratory infection - in pt with history of mild asthma.  Treat with saline nasal spray and nasacort nasal spray as directed.  Mucinex/robitussin as directed.  Continue Qvar bid.  Albuterol inhaler as needed.  12 day prednisone taper as directed.  rx given for omnicef to be taken if symptoms worsen or do not improve.

## 2015-04-28 NOTE — Telephone Encounter (Signed)
rx sent in for Dukes mouthwash per my chart message.   See my chart message.

## 2015-04-28 NOTE — Progress Notes (Signed)
Patient ID: Donna Hensley, female   DOB: 04-May-1965, 49 y.o.   MRN: [id number]   Subjective:    Patient ID: Donna Hensley, female    DOB: [birth year], 49 y.o.   MRN: [id number]  HPI  Patient with past history of asthma, allergies and hypertension.  She comes in today as a work in with concerns regarding increased cough and congestion.  She was evaluated on 04/18/15 for cough and congestion.  States symptoms started several days prior to that appt.  Has had no improvement in symptoms.  Took five days of prednisone.  Now also reports increased sinus pressure and ears feel full.  No sore throat now.  Increased drainage.  Increased cough and wheezing.  Increased fatigue.  No nausea or vomiting.  No diarrhea.  Taking her zyrtec and singulair.  Using Qvar.     Past Medical History  Diagnosis Date  . Allergy   . GERD (gastroesophageal reflux disease)   . Asthma   . Gestational diabetes    Past Surgical History  Procedure Laterality Date  . Appendectomy  1986  . Tonsillectomy  1987  . Cholecystectomy  2013  . Cesarean section  1996 & 2000   Family History  Problem Relation Age of Onset  . Hyperlipidemia Mother   . Osteoporosis Mother   . Hypertension Father   . Arthritis Father   . Sleep apnea Father   . Heart disease Father   . Sleep apnea Brother   . Arthritis Paternal Grandmother   . Diabetes Paternal Grandmother   . Breast cancer Neg Hx    Social History   Social History  . Marital Status: Married    Spouse Name: N/A  . Number of Children: 2  . 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 Smoking status: Former Smoker -- 0.25 packs/day for 5 years    Types: Cigarettes    Quit date: 09/28/1988  . Smokeless tobacco: Never Used     Comment: smoked briefly as a teenager  . Alcohol Use: 0.0 oz/week    0 Standard drinks or equivalent per week     Comment: Occasional  . Drug Use: No  . Sexual Activity: Not Asked    Other Topics Concern  . None   Social History Narrative   Regular exercise-yes, walk   Diet: fast food, trying to improve diet          Outpatient Encounter Prescriptions as of 04/27/2015  Medication Sig  . albuterol (PROVENTIL HFA;VENTOLIN HFA) 108 (90 BASE) MCG/ACT inhaler Inhale 2 puffs into the lungs every 6 (six) hours as needed for wheezing.  . beclomethasone (QVAR) 80 MCG/ACT inhaler Inhale 2 puffs into the lungs 2 (two) times daily.  . cetirizine (ZYRTEC) 10 MG tablet Take 10 mg by mouth daily.  . chlorpheniramine-HYDROcodone (TUSSIONEX PENNKINETIC ER) 10-8 MG/5ML SUER Take 5 mLs by mouth every 12 (twelve) hours as needed.  . fluticasone (FLONASE) 50 MCG/ACT nasal spray USE 2 SPRAYS NASALLY DAILY  . losartan (COZAAR) 25 MG tablet TAKE 1 TABLET DAILY  . metFORMIN (GLUCOPHAGE) 500 MG tablet TAKE 1 TABLET DAILY  . montelukast (SINGULAIR) 10 MG tablet TAKE 1 TABLET DAILY AT BEDTIME  . Multiple Vitamin (MULTIVITAMIN) tablet Take 1 tablet by mouth daily.  [name] omeprazole (PRILOSEC) 40 MG capsule TAKE 1 CAPSULE DAILY  . [DISCONTINUED] predniSONE (DELTASONE) 50 MG tablet 1 tablet daily x 5 days.  . cefdinir (OMNICEF)  300 MG capsule Take 1 capsule (300 mg total) by mouth 2 (two) times daily.  . predniSONE (DELTASONE) 10 MG tablet Take 6 tablets x 1 day and then decrease by 1/2 tablet per day until down to zero mg.   No facility-administered encounter medications on file as of 04/27/2015.    Review of Systems  Constitutional: Positive for fatigue. Negative for fever.  HENT: Positive for congestion, postnasal drip and sinus pressure.        Increased nasal congestion.  Ear fullness.  Increased sinus pressure.  No sore throat now.    Eyes: Negative for discharge and redness.  Respiratory: Positive for cough and wheezing.   Cardiovascular: Negative for chest pain and palpitations.  Gastrointestinal: Negative for nausea, vomiting and diarrhea.  Skin: Negative for color change and  rash.       Objective:     Blood pressure rechecked by me:  136/82  Physical Exam  Constitutional: She appears well-developed and well-nourished. No distress.  HENT:  Mouth/Throat: Oropharynx is clear and moist. No oropharyngeal exudate.  TMs - increased fullness right TM.  Nares - slightly erythematous turbinates.  Increased tenderness to palpation over the maxillary sinus.    Neck: Neck supple.  Cardiovascular: Normal rate and regular rhythm.   Pulmonary/Chest: Breath sounds normal. No respiratory distress.  Increased cough with forced expiration.   Abdominal: Soft. Bowel sounds are normal.  Lymphadenopathy:    She has no cervical adenopathy.    BP 136/84 mmHg  Pulse 81  Temp(Src) 98 F (36.7 C) (Oral)  Ht 5\' 2"  (1.575 m)  Wt 206 lb (93.441 kg)  BMI 37.67 kg/m2  SpO2 97%  LMP 03/27/2015 Wt Readings from Last 3 Encounters:  04/27/15 206 lb (93.441 kg)  04/18/15 205 lb (92.987 kg)  02/04/15 209 lb 4 oz (94.915 kg)     Lab Results  Component Value Date   WBC 8.4 02/15/2015   HGB 13.1 02/15/2015   HCT 40.8 02/15/2015   PLT 329.0 02/15/2015   GLUCOSE 108* 02/15/2015   CHOL 160 02/15/2015   TRIG 89.0 02/15/2015   HDL 51.00 02/15/2015   LDLCALC 91 02/15/2015   ALT 25 02/15/2015   AST 22 02/15/2015   NA 138 02/15/2015   K 4.1 02/15/2015   CL 104 02/15/2015   CREATININE 0.88 02/15/2015   BUN 12 02/15/2015   CO2 25 02/15/2015   TSH 0.74 02/15/2015   HGBA1C 6.7* 02/15/2015   MICROALBUR 2.7* 02/15/2015        Assessment & Plan:   Problem List Items Addressed This Visit    Essential hypertension    Blood pressure on recheck improved.  Same medication regimen.  Follow pressures.        Mild intermittent asthma in adult without complication    Treat current flare as outlined.  Follow.       Relevant Medications   predniSONE (DELTASONE) 10 MG tablet   Respiratory tract infection - Primary    Symptoms and exam as outlined.  Concern over progression.   Symptoms c/w possible sinus infection and respiratory infection - in pt with history of mild asthma.  Treat with saline nasal spray and nasacort nasal spray as directed.  Mucinex/robitussin as directed.  Continue Qvar bid.  Albuterol inhaler as needed.  12 day prednisone taper as directed.  rx given for omnicef to be taken if symptoms worsen or do not improve.        Relevant Medications   cefdinir (OMNICEF) 300 MG  capsule       Einar Pheasant, MD

## 2015-05-17 ENCOUNTER — Encounter: Payer: Self-pay | Admitting: Internal Medicine

## 2015-05-17 NOTE — Telephone Encounter (Signed)
Pt stated she has been sick with cough and congestion since November and not getting better,called and an appointment for pt to reevaluated by Dr. Caryl Bis on 05/18/15/tvw

## 2015-05-18 ENCOUNTER — Encounter: Payer: Self-pay | Admitting: Family Medicine

## 2015-05-18 ENCOUNTER — Ambulatory Visit (INDEPENDENT_AMBULATORY_CARE_PROVIDER_SITE_OTHER): Payer: BLUE CROSS/BLUE SHIELD | Admitting: Family Medicine

## 2015-05-18 ENCOUNTER — Ambulatory Visit (INDEPENDENT_AMBULATORY_CARE_PROVIDER_SITE_OTHER)
Admission: RE | Admit: 2015-05-18 | Discharge: 2015-05-18 | Disposition: A | Discharge status: Home / Self Care | Payer: BLUE CROSS/BLUE SHIELD | Source: Ambulatory Visit | Attending: Family Medicine | Admitting: Family Medicine

## 2015-05-18 VITALS — BP 126/84 | HR 88 | Temp 98.2°F | Ht 62.0 in | Wt 207.8 lb

## 2015-05-18 DIAGNOSIS — R059 Cough, unspecified: Secondary | ICD-10-CM

## 2015-05-18 DIAGNOSIS — R05 Cough: Secondary | ICD-10-CM | POA: Diagnosis not present

## 2015-05-18 DIAGNOSIS — J988 Other specified respiratory disorders: Secondary | ICD-10-CM | POA: Diagnosis not present

## 2015-05-18 MED ORDER — AMOXICILLIN-POT CLAVULANATE 875-125 MG PO TABS: 1.0000 | ORAL_TABLET | Freq: Two times a day (BID) | ORAL | Status: DC

## 2015-05-18 NOTE — Progress Notes (Signed)
Patient ID: Donna Hensley, female   DOB: 26-Jul-1965, 50 y.o.   MRN: [id number]  Donna Rumps, MD Phone: [phone]  Donna Hensley is a 50 y.o. female who presents today for same day visit.   Patient notes persistent cough since last being seen in December for sinus infection and asthma exacerbation. She notes chest congestion with this. No productive cough. No shortness of breath or chest pain. She was treated with omnicef at that time and this improved her sinus symptoms. She notes feeling achey and tired. No fevers. Has taken prednisone as well and will feel better for a few days then go back to feeling the same. Notes this is typical of her course of URI/bronchitis in the past. No GERD symptoms. Does note some post nasal drip though this is mild. Has not been using her albuterol. Denies wheezing.   PMH: Former smoker.   ROS see history of present illness  Objective  Physical Exam Filed Vitals:   05/18/15 0822  BP: 126/84  Pulse: 88  Temp: 98.2 F (36.8 C)    Physical Exam  Constitutional: She is well-developed, well-nourished, and in no distress.  HENT:  Head: Normocephalic and atraumatic.  Right Ear: External ear normal.  Left Ear: External ear normal.  Mouth/Throat: Oropharynx is clear and moist. No oropharyngeal exudate.  Normal TMs bilaterally  Eyes: Conjunctivae are normal. Pupils are equal, round, and reactive to light.  Neck: Neck supple.  Cardiovascular: Normal rate, regular rhythm and normal heart sounds.  Exam reveals no gallop and no friction rub.   No murmur heard. Pulmonary/Chest: Effort normal and breath sounds normal. No respiratory distress. She has no wheezes. She has no rales.  Musculoskeletal: She exhibits no edema.  Lymphadenopathy:    She has no cervical adenopathy.  Neurological: She is alert. Gait normal.  Skin: Skin is warm and dry. She is not diaphoretic.     Assessment/Plan: Please see individual problem list.  Respiratory  tract infection Symptoms most likely consistent with bronchitis. Has not completely improved since her prior treatment with prednisone and Omnicef. She has no reactive airway findings so doubt asthma exacerbation at this time. We will treat her with Augmentin for possible bronchitis and/or persistent sinus issues. She has an allergy to doxycycline. She can continue as needed albuterol. Given lack of reactive airway findings I do not think prednisone would be beneficial at this time. Given persistence of cough we'll obtain a chest x-ray to evaluate for any underlying pathology. She is given return precautions.    Orders Placed This Encounter  Procedures  . DG Chest 2 View    Standing Status: Future     Number of Occurrences: 1     Standing Expiration Date: 07/15/2016    Order Specific Question:  Reason for Exam (SYMPTOM  OR DIAGNOSIS REQUIRED)    Answer:  persistent cough and chest congestion    Order Specific Question:  Is the patient pregnant?    Answer:  No     Comments:  declined pregnancy test    Order Specific Question:  Preferred imaging location?    Answer:  [facility] ordered this encounter  Medications  . amoxicillin-clavulanate (AUGMENTIN) 875-125 MG tablet    Sig: Take 1 tablet by mouth 2 (two) times daily.    Dispense:  14 tablet    Refill:  0    Donna Hensley

## 2015-05-18 NOTE — Progress Notes (Signed)
Pre visit review using our clinic review tool, if applicable. No additional management support is needed unless otherwise documented below in the visit note. 

## 2015-05-18 NOTE — Patient Instructions (Signed)
Nice to meet you. We will treat you for bronchitis with Augmentin. We'll obtain a chest x-ray as well. You should use a probiotic while on the antibiotic. Continue to use your albuterol as needed. If you develop chest pain, shortness of breath, fevers, wheezing, or any new or change in symptoms please seek medical attention.

## 2015-05-18 NOTE — Assessment & Plan Note (Signed)
Symptoms most likely consistent with bronchitis. Has not completely improved since her prior treatment with prednisone and Omnicef. She has no reactive airway findings so doubt asthma exacerbation at this time. We will treat her with Augmentin for possible bronchitis and/or persistent sinus issues. She has an allergy to doxycycline. She can continue as needed albuterol. Given lack of reactive airway findings I do not think prednisone would be beneficial at this time. Given persistence of cough we'll obtain a chest x-ray to evaluate for any underlying pathology. She is given return precautions.

## 2015-06-02 ENCOUNTER — Encounter: Payer: Self-pay | Admitting: Internal Medicine

## 2015-06-02 ENCOUNTER — Ambulatory Visit (INDEPENDENT_AMBULATORY_CARE_PROVIDER_SITE_OTHER): Payer: BLUE CROSS/BLUE SHIELD | Admitting: Internal Medicine

## 2015-06-02 VITALS — BP 128/90 | HR 85 | Temp 97.8°F | Resp 18 | Ht 62.0 in | Wt 209.5 lb

## 2015-06-02 DIAGNOSIS — I10 Essential (primary) hypertension: Secondary | ICD-10-CM | POA: Diagnosis not present

## 2015-06-02 DIAGNOSIS — E119 Type 2 diabetes mellitus without complications: Secondary | ICD-10-CM

## 2015-06-02 DIAGNOSIS — J988 Other specified respiratory disorders: Secondary | ICD-10-CM | POA: Diagnosis not present

## 2015-06-02 DIAGNOSIS — K219 Gastro-esophageal reflux disease without esophagitis: Secondary | ICD-10-CM

## 2015-06-02 DIAGNOSIS — J4520 Mild intermittent asthma, uncomplicated: Secondary | ICD-10-CM

## 2015-06-02 LAB — BASIC METABOLIC PANEL
BUN: 12 mg/dL (ref 6–23)
CO2: 25 mEq/L (ref 19–32)
Calcium: 9.1 mg/dL (ref 8.4–10.5)
Chloride: 104 mEq/L (ref 96–112)
Creatinine, Ser: 1 mg/dL (ref 0.40–1.20)
GFR: 63.12 mL/min (ref >60.00)
Glucose, Bld: 148 mg/dL — ABNORMAL HIGH (ref 70–99)
Potassium: 4 mEq/L (ref 3.5–5.1)
Sodium: 138 mEq/L (ref 135–145)

## 2015-06-02 LAB — HEPATIC FUNCTION PANEL
ALT: 26 U/L (ref 0–35)
AST: 23 U/L (ref 0–37)
Albumin: 3.8 g/dL (ref 3.5–5.2)
Alkaline Phosphatase: 100 U/L (ref 39–117)
Bilirubin, Direct: 0.2 mg/dL (ref 0.0–0.3)
Total Bilirubin: 0.6 mg/dL (ref 0.2–1.2)
Total Protein: 7.1 g/dL (ref 6.0–8.3)

## 2015-06-02 LAB — HEMOGLOBIN A1C: Hgb A1c MFr Bld: 7.6 % — ABNORMAL HIGH (ref 4.6–6.5)

## 2015-06-02 MED ORDER — PREDNISONE 10 MG PO TABS: ORAL_TABLET | ORAL | Status: DC

## 2015-06-02 NOTE — Progress Notes (Signed)
Patient ID: Donna Hensley, female   DOB: May 16, 1965, 50 y.o.   MRN: [id number]   Subjective:    Patient ID: Donna Hensley, female    DOB: [birth year], 50 y.o.   MRN: [id number]  HPI  Patient with past history of asthma, allergies and GERD.  She comes in today to follow up on these issues.  She reports increased throat congestion.  Increased cough and congestion.  Fatigue.  No fever.  Eating and drinking.  No nausea or vomiting.  Bowels stable.  Discussed her sugar.  She has not been watching her diet.  Not exercising regularly.  Plans to restart.  Not checking her sugars.     Past Medical History  Diagnosis Date  . Allergy   . GERD (gastroesophageal reflux disease)   . Asthma   . Gestational diabetes    Past Surgical History  Procedure Laterality Date  . Appendectomy  1986  . Tonsillectomy  1987  . Cholecystectomy  2013  . Cesarean section  1996 & 2000   Family History  Problem Relation Age of Onset  . Hyperlipidemia Mother   . Osteoporosis Mother   . Hypertension Father   . Arthritis Father   . Sleep apnea Father   . Heart disease Father   . Sleep apnea Brother   . Arthritis Paternal Grandmother   . Diabetes Paternal Grandmother   . Breast cancer Neg Hx    Social History   Social History  . Marital Status: Married    Spouse Name: N/A  . Number of Children: 2  . 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 Smoking status: Former Smoker -- 0.25 packs/day for 5 years    Types: Cigarettes    Quit date: 09/28/1988  . Smokeless tobacco: Never Used     Comment: smoked briefly as a teenager  . Alcohol Use: 0.0 oz/week    0 Standard drinks or equivalent per week     Comment: Occasional  . Drug Use: No  . Sexual Activity: Not Asked   Other Topics Concern  . None   Social History Narrative   Regular exercise-yes, walk   Diet: fast food, trying to improve diet          Outpatient Encounter Prescriptions as  of 06/02/2015  Medication Sig  . albuterol (PROVENTIL HFA;VENTOLIN HFA) 108 (90 BASE) MCG/ACT inhaler Inhale 2 puffs into the lungs every 6 (six) hours as needed for wheezing.  . beclomethasone (QVAR) 80 MCG/ACT inhaler Inhale 2 puffs into the lungs 2 (two) times daily.  . cetirizine (ZYRTEC) 10 MG tablet Take 10 mg by mouth daily.  . fluticasone (FLONASE) 50 MCG/ACT nasal spray USE 2 SPRAYS NASALLY DAILY  . losartan (COZAAR) 25 MG tablet TAKE 1 TABLET DAILY  . metFORMIN (GLUCOPHAGE) 500 MG tablet TAKE 1 TABLET DAILY  . montelukast (SINGULAIR) 10 MG tablet TAKE 1 TABLET DAILY AT BEDTIME  . Multiple Vitamin (MULTIVITAMIN) tablet Take 1 tablet by mouth daily.  [name] omeprazole (PRILOSEC) 40 MG capsule TAKE 1 CAPSULE DAILY  . predniSONE (DELTASONE) 10 MG tablet Take 4 tablets x 1 day and then decrease by 1/2 tablet per day until down to zero mg.  . [DISCONTINUED] amoxicillin-clavulanate (AUGMENTIN) 875-125 MG tablet Take 1 tablet by mouth 2 (two) times daily.  . [DISCONTINUED] cefdinir (OMNICEF) 300 MG capsule Take 1 capsule (300 mg total) by mouth 2 (two) times daily.  . [DISCONTINUED] chlorpheniramine-HYDROcodone (  TUSSIONEX PENNKINETIC ER) 10-8 MG/5ML SUER Take 5 mLs by mouth every 12 (twelve) hours as needed.   No facility-administered encounter medications on file as of 06/02/2015.    Review of Systems  Constitutional: Positive for fatigue. Negative for appetite change and unexpected weight change.  HENT: Positive for congestion. Negative for sinus pressure.   Respiratory: Positive for cough and chest tightness.   Cardiovascular: Negative for chest pain, palpitations and leg swelling.  Gastrointestinal: Negative for nausea, vomiting, abdominal pain and diarrhea.  Genitourinary: Negative for dysuria and difficulty urinating.  Musculoskeletal: Negative for back pain and joint swelling.  Skin: Negative for color change and rash.  Neurological: Negative for dizziness, light-headedness and headaches.   Psychiatric/Behavioral: Negative for dysphoric mood and agitation.       Objective:    Physical Exam  Constitutional: She appears well-developed and well-nourished. No distress.  HENT:  Nose: Nose normal.  Mouth/Throat: Oropharynx is clear and moist.  Eyes: Conjunctivae are normal. Right eye exhibits no discharge. Left eye exhibits no discharge.  Neck: Neck supple. No thyromegaly present.  Cardiovascular: Normal rate and regular rhythm.   Pulmonary/Chest: Breath sounds normal. No respiratory distress.  Increased cough with forced expiration and expiration.  Abdominal: Soft. Bowel sounds are normal. There is no tenderness.  Musculoskeletal: She exhibits no edema or tenderness.  Lymphadenopathy:    She has no cervical adenopathy.  Skin: No rash noted. No erythema.  Psychiatric: She has a normal mood and affect. Her behavior is normal.    BP 128/90 mmHg  Pulse 85  Temp(Src) 97.8 F (36.6 C) (Oral)  Resp 18  Ht 5' 2"  (1.575 m)  Wt 209 lb 8 oz (95.029 kg)  BMI 38.31 kg/m2  SpO2 97%  LMP 05/09/2015 Wt Readings from Last 3 Encounters:  06/02/15 209 lb 8 oz (95.029 kg)  05/18/15 207 lb 12.8 oz (94.257 kg)  04/27/15 206 lb (93.441 kg)     Lab Results  Component Value Date   WBC 8.4 02/15/2015   HGB 13.1 02/15/2015   HCT 40.8 02/15/2015   PLT 329.0 02/15/2015   GLUCOSE 148* 06/02/2015   CHOL 160 02/15/2015   TRIG 89.0 02/15/2015   HDL 51.00 02/15/2015   LDLCALC 91 02/15/2015   ALT 26 06/02/2015   AST 23 06/02/2015   NA 138 06/02/2015   K 4.0 06/02/2015   CL 104 06/02/2015   CREATININE 1.00 06/02/2015   BUN 12 06/02/2015   CO2 25 06/02/2015   TSH 0.74 02/15/2015   HGBA1C 7.6* 06/02/2015   MICROALBUR 2.7* 02/15/2015    Dg Chest 2 View  05/18/2015  CLINICAL DATA:  Persistent cough. EXAM: CHEST  2 VIEW COMPARISON:  08/26/2013 . FINDINGS: Mediastinum hilar structures normal. Low lung volumes with mild bibasilar atelectasis. No pleural effusion or pneumothorax.  Heart size normal . IMPRESSION: Low lung volumes with mild bibasilar atelectasis . Electronically Signed   By: Marcello Moores  Register   On: 05/18/2015 10:47       Assessment & Plan:   Problem List Items Addressed This Visit    Diabetes ([place])    Discussed diet and exercise.  Giving her prednisone.  May increased sugars.  Stay hydrated.  Check met b and a1c.  Discussed weight loss.        Relevant Orders   Hepatic function panel (Completed)   Hemoglobin A1c (Completed)   Essential hypertension - Primary    Blood pressure has been under good control.  Slight elevation today.  With the increased  cough, may be related.  Continue current medication regimen.  Have her spot check her pressure.  Follow metabolic panel.        Relevant Orders   Basic metabolic panel (Completed)   GERD    On omeprazole.  Controlled.        Mild intermittent asthma in adult without complication    Treat with prednisone as directed.  Follow.        Relevant Medications   predniSONE (DELTASONE) 10 MG tablet   Respiratory tract infection    Persistent cough.  Appears to be more reactive airways.  Hold on further abx.  Do feel she warrants oral prednisone.  Continue Qvar.  Prednisone taper as directed.  Follow closely.  Call with update.            Einar Pheasant, MD

## 2015-06-02 NOTE — Progress Notes (Signed)
Pre-visit discussion using our clinic review tool. No additional management support is needed unless otherwise documented below in the visit note.  

## 2015-06-05 ENCOUNTER — Encounter: Payer: Self-pay | Admitting: Internal Medicine

## 2015-06-05 NOTE — Assessment & Plan Note (Signed)
Discussed diet and exercise.  Giving her prednisone.  May increased sugars.  Stay hydrated.  Check met b and a1c.  Discussed weight loss.

## 2015-06-05 NOTE — Assessment & Plan Note (Signed)
Persistent cough.  Appears to be more reactive airways.  Hold on further abx.  Do feel she warrants oral prednisone.  Continue Qvar.  Prednisone taper as directed.  Follow closely.  Call with update.

## 2015-06-05 NOTE — Assessment & Plan Note (Signed)
Treat with prednisone as directed.  Follow.

## 2015-06-05 NOTE — Assessment & Plan Note (Signed)
On omeprazole.  Controlled.   

## 2015-06-05 NOTE — Assessment & Plan Note (Signed)
Blood pressure has been under good control.  Slight elevation today.  With the increased cough, may be related.  Continue current medication regimen.  Have her spot check her pressure.  Follow metabolic panel.

## 2015-06-22 ENCOUNTER — Ambulatory Visit (INDEPENDENT_AMBULATORY_CARE_PROVIDER_SITE_OTHER): Payer: BLUE CROSS/BLUE SHIELD | Admitting: Internal Medicine

## 2015-06-22 ENCOUNTER — Telehealth: Payer: Self-pay | Admitting: *Deleted

## 2015-06-22 ENCOUNTER — Encounter: Payer: Self-pay | Admitting: Internal Medicine

## 2015-06-22 VITALS — BP 110/70 | HR 87 | Temp 98.4°F | Resp 18 | Ht 62.0 in | Wt 202.2 lb

## 2015-06-22 DIAGNOSIS — J029 Acute pharyngitis, unspecified: Secondary | ICD-10-CM

## 2015-06-22 DIAGNOSIS — B373 Candidiasis of vulva and vagina: Secondary | ICD-10-CM

## 2015-06-22 DIAGNOSIS — H6692 Otitis media, unspecified, left ear: Secondary | ICD-10-CM

## 2015-06-22 DIAGNOSIS — I10 Essential (primary) hypertension: Secondary | ICD-10-CM

## 2015-06-22 DIAGNOSIS — B3731 Acute candidiasis of vulva and vagina: Secondary | ICD-10-CM

## 2015-06-22 DIAGNOSIS — E119 Type 2 diabetes mellitus without complications: Secondary | ICD-10-CM | POA: Diagnosis not present

## 2015-06-22 MED ORDER — AMOXICILLIN 875 MG PO TABS: 875.0000 mg | ORAL_TABLET | Freq: Two times a day (BID) | ORAL | Status: DC

## 2015-06-22 MED ORDER — FIRST-DUKES MOUTHWASH MT SUSP: OROMUCOSAL | Status: DC

## 2015-06-22 MED ORDER — FLUCONAZOLE 150 MG PO TABS: ORAL_TABLET | ORAL | Status: DC

## 2015-06-22 NOTE — Telephone Encounter (Signed)
Pt is requesting a diflucan pill with her antibiotics. Please advise, thanks

## 2015-06-22 NOTE — Progress Notes (Signed)
Pre-visit discussion using our clinic review tool. No additional management support is needed unless otherwise documented below in the visit note.  

## 2015-06-22 NOTE — Telephone Encounter (Signed)
rx sent in for diflucan  

## 2015-06-22 NOTE — Telephone Encounter (Signed)
Pharmacy did not receive patients diflucan Rx today Pharmacy [facility] in [place]

## 2015-06-22 NOTE — Progress Notes (Signed)
Patient ID: ARDYTHE KLUTE, female   DOB: 09/13/1965, 50 y.o.   MRN: [id number]   Subjective:    Patient ID: [name], female    DOB: 12-Jun-1965, 50 y.o.   MRN: [id number]  HPI  Patient with past history of asthma, GERD and allergies.  She comes in today to discuss her elevated sugars.  Discussed her recent lab resutls and elevated a1c. I saw her on 06/02/15 for cough and congestion.  Refer to that note for details.  Treated with prednisone and continued inhalers. Symptoms/cough - resolved.  Was feeling better until last week.  Started with sore throat last week. Has notice white plaque on uvula.   Increased drainage.  Ears aching.  Tmax 100.2.  No chest congestion. No chest tightness.  Using her inhalers.  Minimal cough from drainage.  No nausea or vomiting.  No acid reflux.  Discussed her labs.  a1c increased.  She has started doing better watching her diet.  Has lost weight.  Sugars improving.  Am sugars now averaging 120-140.  Bowels stable.  Reports vaginal itching and discharge c/w yeast infection.  Is using monistat.  Requesting diflucan.     Past Medical History  Diagnosis Date  . Allergy   . GERD (gastroesophageal reflux disease)   . Asthma   . Gestational diabetes    Past Surgical History  Procedure Laterality Date  . Appendectomy  1986  . Tonsillectomy  1987  . Cholecystectomy  2013  . Cesarean section  1996 & 2000   Family History  Problem Relation Age of Onset  . Hyperlipidemia Mother   . Osteoporosis Mother   . Hypertension Father   . Arthritis Father   . Sleep apnea Father   . Heart disease Father   . Sleep apnea Brother   . Arthritis Paternal Grandmother   . Diabetes Paternal Grandmother   . Breast cancer Neg Hx    Social History   Social History  . Marital Status: Married    Spouse Name: N/A  . Number of Children: 2  . 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 Smoking status: Former Smoker --  0.25 packs/day for 5 years    Types: Cigarettes    Quit date: 09/28/1988  . Smokeless tobacco: Never Used     Comment: smoked briefly as a teenager  . Alcohol Use: 0.0 oz/week    0 Standard drinks or equivalent per week     Comment: Occasional  . Drug Use: No  . Sexual Activity: Not Asked   Other Topics Concern  . None   Social History Narrative   Regular exercise-yes, walk   Diet: fast food, trying to improve diet          Outpatient Encounter Prescriptions as of 06/22/2015  Medication Sig  . albuterol (PROVENTIL HFA;VENTOLIN HFA) 108 (90 BASE) MCG/ACT inhaler Inhale 2 puffs into the lungs every 6 (six) hours as needed for wheezing.  . beclomethasone (QVAR) 80 MCG/ACT inhaler Inhale 2 puffs into the lungs 2 (two) times daily.  . cetirizine (ZYRTEC) 10 MG tablet Take 10 mg by mouth daily.  . fluticasone (FLONASE) 50 MCG/ACT nasal spray USE 2 SPRAYS NASALLY DAILY  . losartan (COZAAR) 25 MG tablet TAKE 1 TABLET DAILY  . metFORMIN (GLUCOPHAGE) 500 MG tablet TAKE 1 TABLET DAILY  . montelukast (SINGULAIR) 10 MG tablet TAKE 1 TABLET DAILY AT BEDTIME  . Multiple Vitamin (MULTIVITAMIN) tablet  Take 1 tablet by mouth daily.  [name] omeprazole (PRILOSEC) 40 MG capsule TAKE 1 CAPSULE DAILY  . [DISCONTINUED] predniSONE (DELTASONE) 10 MG tablet Take 4 tablets x 1 day and then decrease by 1/2 tablet per day until down to zero mg.  . amoxicillin (AMOXIL) 875 MG tablet Take 1 tablet (875 mg total) by mouth 2 (two) times daily.  . Diphenhyd-Hydrocort-Nystatin (FIRST-DUKES MOUTHWASH) SUSP 5cc's swish and spit tid   No facility-administered encounter medications on file as of 06/22/2015.    Review of Systems  Constitutional: Positive for fever and fatigue.  HENT: Positive for congestion, ear pain, postnasal drip and sore throat.   Eyes: Negative for discharge and redness.  Respiratory: Positive for cough (minimal cough). Negative for chest tightness and shortness of breath.   Cardiovascular:  Negative for chest pain, palpitations and leg swelling.  Gastrointestinal: Negative for nausea, vomiting and diarrhea.  Skin: Negative for color change and rash.  Neurological: Negative for dizziness, light-headedness and headaches.  Psychiatric/Behavioral: Negative for dysphoric mood and agitation.       Objective:    Physical Exam  Constitutional: She appears well-developed and well-nourished. No distress.  HENT:  Nares - slightly erythematous turbinates.  Minimal tenderness to palpation over maxillary sinus.  Left TM with erythema and fullness.  Right TM clear.  White plaque - uvula.    Neck: Neck supple.  Cardiovascular: Normal rate and regular rhythm.   Pulmonary/Chest: Breath sounds normal. No respiratory distress. She has no wheezes.  Abdominal: Soft. Bowel sounds are normal. There is no tenderness.  Musculoskeletal: She exhibits no edema or tenderness.  Lymphadenopathy:    She has no cervical adenopathy.  Skin: No rash noted. No erythema.  Psychiatric: She has a normal mood and affect. Her behavior is normal.    BP 110/70 mmHg  Pulse 87  Temp(Src) 98.4 F (36.9 C) (Oral)  Resp 18  Ht 5' 2"  (1.575 m)  Wt 202 lb 4 oz (91.74 kg)  BMI 36.98 kg/m2  SpO2 97% Wt Readings from Last 3 Encounters:  06/22/15 202 lb 4 oz (91.74 kg)  06/02/15 209 lb 8 oz (95.029 kg)  05/18/15 207 lb 12.8 oz (94.257 kg)     Lab Results  Component Value Date   WBC 8.4 02/15/2015   HGB 13.1 02/15/2015   HCT 40.8 02/15/2015   PLT 329.0 02/15/2015   GLUCOSE 148* 06/02/2015   CHOL 160 02/15/2015   TRIG 89.0 02/15/2015   HDL 51.00 02/15/2015   LDLCALC 91 02/15/2015   ALT 26 06/02/2015   AST 23 06/02/2015   NA 138 06/02/2015   K 4.0 06/02/2015   CL 104 06/02/2015   CREATININE 1.00 06/02/2015   BUN 12 06/02/2015   CO2 25 06/02/2015   TSH 0.74 02/15/2015   HGBA1C 7.6* 06/02/2015   MICROALBUR 2.7* 02/15/2015    Dg Chest 2 View  05/18/2015  CLINICAL DATA:  Persistent cough. EXAM:  CHEST  2 VIEW COMPARISON:  08/26/2013 . FINDINGS: Mediastinum hilar structures normal. Low lung volumes with mild bibasilar atelectasis. No pleural effusion or pneumothorax. Heart size normal . IMPRESSION: Low lung volumes with mild bibasilar atelectasis . Electronically Signed   By: Marcello Moores  Register   On: 05/18/2015 10:47       Assessment & Plan:   Problem List Items Addressed This Visit    Diabetes ([place])    Back on metformin.  Has adjusted her diet.  Lost weight.  Sugars starting to improve.  Hold on additional medication since  she is back on metformin now.  Continue diet, exercise and weight loss.  Send in sugar readings over the next 2-3 weeks.  Follow met b and a1c.       Relevant Orders   Lipid panel   Hepatic function panel   Hemoglobin V7V   Basic metabolic panel   Essential hypertension    Blood pressure has been doing well.  Continue same medication regimen.        Left otitis media    Ear exam as outlined.  Treat with amoxicillin as directed.  Take probiotic as directed.  Follow.        Relevant Medications   amoxicillin (AMOXIL) 875 MG tablet   Sore throat - Primary    Persistent sore throat.  Lesion on uvula.  Obtain throat culture.  Dukes mouthwash as directed.  Follow.  Treat ear infection with amoxicillin as directed.        Relevant Orders   Culture, Group A Strep   Vaginal yeast infection    Using monistat with persistent symptoms.  Diflucan sent in.  Follow.        Relevant Medications   Diphenhyd-Hydrocort-Nystatin (FIRST-DUKES MOUTHWASH) SUSP       Einar Pheasant, MD

## 2015-06-23 ENCOUNTER — Encounter: Payer: Self-pay | Admitting: Internal Medicine

## 2015-06-23 DIAGNOSIS — B373 Candidiasis of vulva and vagina: Secondary | ICD-10-CM | POA: Insufficient documentation

## 2015-06-23 DIAGNOSIS — B3731 Acute candidiasis of vulva and vagina: Secondary | ICD-10-CM | POA: Insufficient documentation

## 2015-06-23 DIAGNOSIS — H6692 Otitis media, unspecified, left ear: Secondary | ICD-10-CM | POA: Insufficient documentation

## 2015-06-23 NOTE — Assessment & Plan Note (Signed)
Persistent sore throat.  Lesion on uvula.  Obtain throat culture.  Dukes mouthwash as directed.  Follow.  Treat ear infection with amoxicillin as directed.

## 2015-06-23 NOTE — Assessment & Plan Note (Signed)
Ear exam as outlined.  Treat with amoxicillin as directed.  Take probiotic as directed.  Follow.

## 2015-06-23 NOTE — Assessment & Plan Note (Signed)
Using monistat with persistent symptoms.  Diflucan sent in.  Follow.

## 2015-06-23 NOTE — Assessment & Plan Note (Signed)
Back on metformin.  Has adjusted her diet.  Lost weight.  Sugars starting to improve.  Hold on additional medication since she is back on metformin now.  Continue diet, exercise and weight loss.  Send in sugar readings over the next 2-3 weeks.  Follow met b and a1c.

## 2015-06-23 NOTE — Assessment & Plan Note (Signed)
Blood pressure has been doing well.  Continue same medication regimen.

## 2015-06-24 ENCOUNTER — Encounter: Payer: Self-pay | Admitting: Internal Medicine

## 2015-06-24 LAB — CULTURE, GROUP A STREP: Organism ID, Bacteria: NORMAL

## 2015-06-28 NOTE — Telephone Encounter (Signed)
Unread mychart message mailed to patient 

## 2015-07-10 ENCOUNTER — Encounter: Payer: Self-pay | Admitting: Internal Medicine

## 2015-07-11 ENCOUNTER — Other Ambulatory Visit: Payer: Self-pay | Admitting: Internal Medicine

## 2015-08-16 ENCOUNTER — Encounter: Payer: Self-pay | Admitting: Internal Medicine

## 2015-08-16 DIAGNOSIS — Z9109 Other allergy status, other than to drugs and biological substances: Secondary | ICD-10-CM

## 2015-08-18 ENCOUNTER — Encounter: Payer: Self-pay | Admitting: Family Medicine

## 2015-08-18 ENCOUNTER — Ambulatory Visit (INDEPENDENT_AMBULATORY_CARE_PROVIDER_SITE_OTHER): Payer: BLUE CROSS/BLUE SHIELD | Admitting: Family Medicine

## 2015-08-18 VITALS — BP 136/88 | HR 92 | Temp 98.2°F | Ht 62.0 in | Wt 205.6 lb

## 2015-08-18 DIAGNOSIS — J309 Allergic rhinitis, unspecified: Secondary | ICD-10-CM

## 2015-08-18 MED ORDER — PREDNISONE 20 MG PO TABS: 40.0000 mg | ORAL_TABLET | Freq: Every day | ORAL | Status: DC

## 2015-08-18 MED ORDER — HYDROCODONE-HOMATROPINE 5-1.5 MG/5ML PO SYRP: 5.0000 mL | ORAL_SOLUTION | Freq: Three times a day (TID) | ORAL | Status: DC | PRN

## 2015-08-18 NOTE — Assessment & Plan Note (Signed)
Suspect symptoms are most likely related to allergic rhinitis given her history of previously consistent symptoms. We will trial her on prednisone to treat this. Advised to monitor for increased blood sugars and if she feels different and her blood sugars are elevated to let us know or if her blood sugars are greater than 300 to let us know. She'll continue Singulair, Flonase, Zyrtec, and Qvar. Hycodan for cough. Given return precautions.

## 2015-08-18 NOTE — Progress Notes (Signed)
Patient ID: Donna Hensley, female   DOB: Dec 31, 1965, 50 y.o.   MRN: [id number]  Tommi Rumps, MD Phone: [phone]  Donna Hensley is a 50 y.o. female who presents today for same-day visit.  Allergic rhinitis: Patient notes since last Friday she has had sinus congestion and postnasal drip with some cough. No nose congestion. She notes watery eyes. Some runny nose. She is coughing up mild mucus that is white. No shortness of breath or fevers. She has a history of allergies which she reports is consistent with the symptoms. She's taking Singulair, Flonase, Zyrtec, and Qvar. She tried Delsym over-the-counter which was not beneficial.  PMH: Former smoker   ROS see history of present illness  Objective  Physical Exam Filed Vitals:   08/18/15 0803  BP: 136/88  Pulse: 92  Temp: 98.2 F (36.8 C)    BP Readings from Last 3 Encounters:  08/18/15 136/88  06/22/15 110/70  06/02/15 128/90   Wt Readings from Last 3 Encounters:  08/18/15 205 lb 9.6 oz (93.26 kg)  06/22/15 202 lb 4 oz (91.74 kg)  06/02/15 209 lb 8 oz (95.029 kg)    Physical Exam  Constitutional: She is well-developed, well-nourished, and in no distress.  HENT:  Head: Normocephalic and atraumatic.  Right Ear: External ear normal.  Left Ear: External ear normal.  Mouth/Throat: Oropharynx is clear and moist. No oropharyngeal exudate.  Normal TMs bilaterally  Eyes: Conjunctivae are normal. Pupils are equal, round, and reactive to light.  Neck: Neck supple.  Cardiovascular: Normal rate, regular rhythm and normal heart sounds.   Pulmonary/Chest: Effort normal and breath sounds normal.  Lymphadenopathy:    She has no cervical adenopathy.  Neurological: She is alert. Gait normal.  Skin: Skin is warm and dry. She is not diaphoretic.     Assessment/Plan: Please see individual problem list.  Allergic rhinitis Suspect symptoms are most likely related to allergic rhinitis given her history of previously  consistent symptoms. We will trial her on prednisone to treat this. Advised to monitor for increased blood sugars and if she feels different and her blood sugars are elevated to let us know or if her blood sugars are greater than 300 to let us know. She'll continue Singulair, Flonase, Zyrtec, and Qvar. Hycodan for cough. Given return precautions.    No orders of the defined types were placed in this encounter.    Meds ordered this encounter  Medications  . predniSONE (DELTASONE) 20 MG tablet    Sig: Take 2 tablets (40 mg total) by mouth daily with breakfast.    Dispense:  10 tablet    Refill:  0  . HYDROcodone-homatropine (HYCODAN) 5-1.5 MG/5ML syrup    Sig: Take 5 mLs by mouth every 8 (eight) hours as needed for cough.    Dispense:  120 mL    Refill:  0    Tommi Rumps, MD [place]

## 2015-08-18 NOTE — Patient Instructions (Signed)
Nice to see you. You likely have allergies. We will treat this with prednisone. He can use the Hycodan for cough. If you develop shortness of breath, cough productive of blood, fevers, or any new or changing symptoms please seek medical attention.

## 2015-08-18 NOTE — Progress Notes (Signed)
Pre visit review using our clinic review tool, if applicable. No additional management support is needed unless otherwise documented below in the visit note. 

## 2015-08-18 NOTE — Telephone Encounter (Signed)
Order placed for referral to allergist.  °

## 2015-08-29 ENCOUNTER — Other Ambulatory Visit: Payer: Self-pay | Admitting: Internal Medicine

## 2015-08-29 MED ORDER — FIRST-DUKES MOUTHWASH MT SUSP: OROMUCOSAL | Status: DC

## 2015-08-29 NOTE — Telephone Encounter (Signed)
ok'd refill x 1.  rx sent to pharmacy.

## 2015-08-29 NOTE — Telephone Encounter (Signed)
Patient states that prednisone gave her thrush and would like a refill on mouthwash. Please advise?

## 2015-09-02 ENCOUNTER — Other Ambulatory Visit: Payer: BLUE CROSS/BLUE SHIELD

## 2015-09-06 ENCOUNTER — Ambulatory Visit: Payer: BLUE CROSS/BLUE SHIELD | Admitting: Internal Medicine

## 2015-09-15 ENCOUNTER — Ambulatory Visit: Payer: BLUE CROSS/BLUE SHIELD | Admitting: Internal Medicine

## 2015-09-26 ENCOUNTER — Other Ambulatory Visit: Payer: Self-pay | Admitting: Internal Medicine

## 2015-10-05 ENCOUNTER — Encounter: Payer: Self-pay | Admitting: Internal Medicine

## 2015-10-05 NOTE — Telephone Encounter (Signed)
I had placed order for allergy referral.  Pt sent message today inquiring about appt.  See her my chart message.  Sees an appt with allergist.

## 2015-11-03 ENCOUNTER — Other Ambulatory Visit (INDEPENDENT_AMBULATORY_CARE_PROVIDER_SITE_OTHER): Payer: BLUE CROSS/BLUE SHIELD

## 2015-11-03 DIAGNOSIS — E119 Type 2 diabetes mellitus without complications: Secondary | ICD-10-CM

## 2015-11-03 LAB — LIPID PANEL
Cholesterol: 149 mg/dL (ref 0–200)
HDL: 55.8 mg/dL (ref >39.00)
LDL Cholesterol: 80 mg/dL (ref 0–99)
NonHDL: 93.29
Total CHOL/HDL Ratio: 3
Triglycerides: 67 mg/dL (ref 0.0–149.0)
VLDL: 13.4 mg/dL (ref 0.0–40.0)

## 2015-11-03 LAB — BASIC METABOLIC PANEL
BUN: 13 mg/dL (ref 6–23)
CO2: 26 mEq/L (ref 19–32)
Calcium: 9.5 mg/dL (ref 8.4–10.5)
Chloride: 103 mEq/L (ref 96–112)
Creatinine, Ser: 0.93 mg/dL (ref 0.40–1.20)
GFR: 68.51 mL/min (ref >60.00)
Glucose, Bld: 129 mg/dL — ABNORMAL HIGH (ref 70–99)
Potassium: 4.1 mEq/L (ref 3.5–5.1)
Sodium: 136 mEq/L (ref 135–145)

## 2015-11-03 LAB — HEPATIC FUNCTION PANEL
ALT: 30 U/L (ref 0–35)
AST: 28 U/L (ref 0–37)
Albumin: 4.2 g/dL (ref 3.5–5.2)
Alkaline Phosphatase: 106 U/L (ref 39–117)
Bilirubin, Direct: 0.2 mg/dL (ref 0.0–0.3)
Total Bilirubin: 0.7 mg/dL (ref 0.2–1.2)
Total Protein: 7.5 g/dL (ref 6.0–8.3)

## 2015-11-03 LAB — HEMOGLOBIN A1C: Hgb A1c MFr Bld: 7 % — ABNORMAL HIGH (ref 4.6–6.5)

## 2015-11-04 ENCOUNTER — Encounter: Payer: Self-pay | Admitting: Internal Medicine

## 2015-11-11 ENCOUNTER — Encounter: Payer: Self-pay | Admitting: Internal Medicine

## 2015-11-11 ENCOUNTER — Ambulatory Visit (INDEPENDENT_AMBULATORY_CARE_PROVIDER_SITE_OTHER): Payer: BLUE CROSS/BLUE SHIELD | Admitting: Internal Medicine

## 2015-11-11 VITALS — BP 112/80 | HR 80 | Temp 98.5°F | Resp 18 | Ht 62.0 in | Wt 207.1 lb

## 2015-11-11 DIAGNOSIS — I10 Essential (primary) hypertension: Secondary | ICD-10-CM | POA: Diagnosis not present

## 2015-11-11 DIAGNOSIS — Z0000 Encounter for general adult medical examination without abnormal findings: Secondary | ICD-10-CM | POA: Diagnosis not present

## 2015-11-11 DIAGNOSIS — J4520 Mild intermittent asthma, uncomplicated: Secondary | ICD-10-CM

## 2015-11-11 DIAGNOSIS — Z1239 Encounter for other screening for malignant neoplasm of breast: Secondary | ICD-10-CM

## 2015-11-11 DIAGNOSIS — K219 Gastro-esophageal reflux disease without esophagitis: Secondary | ICD-10-CM

## 2015-11-11 DIAGNOSIS — Z91048 Other nonmedicinal substance allergy status: Secondary | ICD-10-CM

## 2015-11-11 DIAGNOSIS — E119 Type 2 diabetes mellitus without complications: Secondary | ICD-10-CM | POA: Diagnosis not present

## 2015-11-11 DIAGNOSIS — E611 Iron deficiency: Secondary | ICD-10-CM

## 2015-11-11 DIAGNOSIS — Z9109 Other allergy status, other than to drugs and biological substances: Secondary | ICD-10-CM

## 2015-11-11 MED ORDER — FLUTICASONE PROPIONATE 50 MCG/ACT NA SUSP: NASAL | Status: DC

## 2015-11-11 NOTE — Progress Notes (Signed)
Pre-visit discussion using our clinic review tool. No additional management support is needed unless otherwise documented below in the visit note.  

## 2015-11-11 NOTE — Progress Notes (Signed)
Patient ID: Donna Hensley, female   DOB: 11/21/1965, 50 y.o.   MRN: [id number]   Subjective:    Patient ID: Donna Hensley, female    DOB: 04-29-1966, 50 y.o.   MRN: [id number]  HPI  Patient here for a scheduled follow up.  Breathing is better.  No increased cough and congestion.  She had been referred to an allergist.  No appt scheduled to date.  Will get appt.  She has been under increased stress recently.  Her daughter recently married.  Her son is graduating.  Overall she feels she is handling things relatively well.  Cholesterol looks good.  No acid reflux.  No abdominal pain or cramping. Bowels stable.   She has adjusted her diet.  Sugars are better.  Desires two hour refresher class.     Past Medical History  Diagnosis Date  . Allergy   . GERD (gastroesophageal reflux disease)   . Asthma   . Gestational diabetes    Past Surgical History  Procedure Laterality Date  . Appendectomy  1986  . Tonsillectomy  1987  . Cholecystectomy  2013  . Cesarean section  1996 & 2000   Family History  Problem Relation Age of Onset  . Hyperlipidemia Mother   . Osteoporosis Mother   . Hypertension Father   . Arthritis Father   . Sleep apnea Father   . Heart disease Father   . Sleep apnea Brother   . Arthritis Paternal Grandmother   . Diabetes Paternal Grandmother   . Breast cancer Neg Hx    Social History   Social History  . Marital Status: Married    Spouse Name: N/A  . Number of Children: 2  . 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 Smoking status: Former Smoker -- 0.25 packs/day for 5 years    Types: Cigarettes    Quit date: 09/28/1988  . Smokeless tobacco: Never Used     Comment: smoked briefly as a teenager  . Alcohol Use: 0.0 oz/week    0 Standard drinks or equivalent per week     Comment: Occasional  . Drug Use: No  . Sexual Activity: Not Asked   Other Topics Concern  . None   Social History Narrative   Regular exercise-yes, walk   Diet: fast food, trying to improve diet          Outpatient Encounter Prescriptions as of 11/11/2015  Medication Sig  . albuterol (PROVENTIL HFA;VENTOLIN HFA) 108 (90 BASE) MCG/ACT inhaler Inhale 2 puffs into the lungs every 6 (six) hours as needed for wheezing.  . beclomethasone (QVAR) 80 MCG/ACT inhaler Inhale 2 puffs into the lungs 2 (two) times daily.  . cetirizine (ZYRTEC) 10 MG tablet Take 10 mg by mouth daily.  . fluticasone (FLONASE) 50 MCG/ACT nasal spray USE 2 SPRAYS EACH NOSTRIL DAILY  . losartan (COZAAR) 25 MG tablet TAKE 1 TABLET DAILY  . metFORMIN (GLUCOPHAGE) 500 MG tablet TAKE 1 TABLET DAILY  . montelukast (SINGULAIR) 10 MG tablet TAKE 1 TABLET DAILY AT BEDTIME  . Multiple Vitamin (MULTIVITAMIN) tablet Take 1 tablet by mouth daily.  [name] omeprazole (PRILOSEC) 40 MG capsule TAKE 1 CAPSULE DAILY  . [DISCONTINUED] Diphenhyd-Hydrocort-Nystatin (FIRST-DUKES MOUTHWASH) SUSP 5cc's swish and spit tid  . [DISCONTINUED] fluconazole (DIFLUCAN) 150 MG tablet Take one tablet x 1 and then repeat x 1 in 3 days if not resolved.  . [DISCONTINUED] fluticasone (FLONASE) 50 MCG/ACT nasal  spray USE 2 SPRAYS NASALLY DAILY  . [DISCONTINUED] HYDROcodone-homatropine (HYCODAN) 5-1.5 MG/5ML syrup Take 5 mLs by mouth every 8 (eight) hours as needed for cough.  . [DISCONTINUED] predniSONE (DELTASONE) 20 MG tablet Take 2 tablets (40 mg total) by mouth daily with breakfast.   No facility-administered encounter medications on file as of 11/11/2015.    Review of Systems  Constitutional: Negative for appetite change and unexpected weight change.  HENT: Negative for congestion and sinus pressure.   Respiratory: Negative for cough, chest tightness and shortness of breath.   Cardiovascular: Negative for chest pain, palpitations and leg swelling.  Gastrointestinal: Negative for nausea, vomiting, abdominal pain and diarrhea.  Genitourinary: Negative for dysuria and difficulty  urinating.  Musculoskeletal: Negative for back pain and joint swelling.  Skin: Negative for color change and rash.  Neurological: Negative for dizziness, light-headedness and headaches.  Psychiatric/Behavioral: Negative for dysphoric mood and agitation.       Objective:    Physical Exam  Constitutional: She appears well-developed and well-nourished. No distress.  HENT:  Nose: Nose normal.  Mouth/Throat: Oropharynx is clear and moist.  Neck: Neck supple. No thyromegaly present.  Cardiovascular: Normal rate and regular rhythm.   Pulmonary/Chest: Breath sounds normal. No respiratory distress. She has no wheezes.  Abdominal: Soft. Bowel sounds are normal. There is no tenderness.  Musculoskeletal: She exhibits no edema or tenderness.  Lymphadenopathy:    She has no cervical adenopathy.  Skin: No rash noted. No erythema.  Psychiatric: She has a normal mood and affect. Her behavior is normal.    BP 112/80 mmHg  Pulse 80  Temp(Src) 98.5 F (36.9 C) (Oral)  Resp 18  Ht _0  (1.575 m)  Wt 207 lb 2 oz (93.951 kg)  BMI 37.87 kg/m2  SpO2 98%  LMP 10/28/2015 (Approximate) Wt Readings from Last 3 Encounters:  11/11/15 207 lb 2 oz (93.951 kg)  08/18/15 205 lb 9.6 oz (93.26 kg)  06/22/15 202 lb 4 oz (91.74 kg)     Lab Results  Component Value Date   WBC 8.4 02/15/2015   HGB 13.1 02/15/2015   HCT 40.8 02/15/2015   PLT 329.0 02/15/2015   GLUCOSE 129* 11/03/2015   CHOL 149 11/03/2015   TRIG 67.0 11/03/2015   HDL 55.80 11/03/2015   LDLCALC 80 11/03/2015   ALT 30 11/03/2015   AST 28 11/03/2015   NA 136 11/03/2015   K 4.1 11/03/2015   CL 103 11/03/2015   CREATININE 0.93 11/03/2015   BUN 13 11/03/2015   CO2 26 11/03/2015   TSH 0.74 02/15/2015   HGBA1C 7.0* 11/03/2015   MICROALBUR 2.7* 02/15/2015    Dg Chest 2 View  05/18/2015  CLINICAL DATA:  Persistent cough. EXAM: CHEST  2 VIEW COMPARISON:  08/26/2013 . FINDINGS: Mediastinum hilar structures normal. Low lung volumes  with mild bibasilar atelectasis. No pleural effusion or pneumothorax. Heart size normal . IMPRESSION: Low lung volumes with mild bibasilar atelectasis . Electronically Signed   By: Marcello Moores  Register   On: 05/18/2015 10:47       Assessment & Plan:   Problem List Items Addressed This Visit    Diabetes ([place])    a1c just checked 7.0.  Low carb diet and exercise.  Follow met b and a1c.        Relevant Orders   Lipid panel   Hepatic function panel   Hemoglobin N3I   Basic metabolic panel   Microalbumin / creatinine urine ratio   Essential hypertension  Blood pressure under good control.  Continue same medication regimen.  Follow pressures.  Follow metabolic panel.        Relevant Orders   TSH   Essential hypertension, benign    Blood pressure under good control.  Continue same medication regimen.  Follow pressures.  Follow metabolic panel.        GERD    Reflux controlled on omeprazole.  Follow.       Health care maintenance   Iron deficiency    Follow cbc and ferritin.       Relevant Orders   CBC with Differential/Platelet   Mild intermittent asthma in adult without complication    Breathing stable.  Refer to allergist as outlined.        Relevant Orders   Ambulatory referral to Allergy    Other Visit Diagnoses    Screening breast examination    -  Primary    Relevant Orders    MM DIGITAL SCREENING BILATERAL    Environmental allergies        Relevant Orders    Ambulatory referral to Allergy        Einar Pheasant, MD

## 2015-11-12 ENCOUNTER — Encounter: Payer: Self-pay | Admitting: Internal Medicine

## 2015-11-12 NOTE — Assessment & Plan Note (Signed)
Blood pressure under good control.  Continue same medication regimen.  Follow pressures.  Follow metabolic panel.   

## 2015-11-12 NOTE — Assessment & Plan Note (Signed)
Follow cbc and ferritin.  

## 2015-11-12 NOTE — Assessment & Plan Note (Signed)
a1c just checked 7.0.  Low carb diet and exercise.  Follow met b and a1c.

## 2015-11-12 NOTE — Assessment & Plan Note (Signed)
Reflux controlled on omeprazole.  Follow.

## 2015-11-12 NOTE — Assessment & Plan Note (Signed)
Breathing stable.  Refer to allergist as outlined.

## 2015-11-28 ENCOUNTER — Encounter: Payer: Self-pay | Admitting: *Deleted

## 2015-11-28 ENCOUNTER — Encounter: Payer: BLUE CROSS/BLUE SHIELD | Attending: Internal Medicine | Admitting: *Deleted

## 2015-11-28 VITALS — BP 112/78 | Ht 62.0 in | Wt 206.7 lb

## 2015-11-28 DIAGNOSIS — E119 Type 2 diabetes mellitus without complications: Secondary | ICD-10-CM | POA: Insufficient documentation

## 2015-11-28 NOTE — Patient Instructions (Signed)
Check blood sugars 2 x day before breakfast and 2 hrs after supper every day  Exercise: Continue exercise program for  30  minutes  4 days a week and gradually increase to 150 minutes/week  Eat 3 meals day, 1-2 snacks a day Space meals 4-6 hours apart  Bring blood sugar records to the next class  Return for classes on:

## 2015-11-29 NOTE — Progress Notes (Signed)
Diabetes Self-Management Education  Visit Type: First/Initial  Appt. Start Time: 1555 Appt. End Time: [id number]  11/29/2015  [name], identified by name and date of birth, is a 50 y.o. female with a diagnosis of Diabetes: Type 2.   ASSESSMENT  Blood pressure 112/78, height 5\' 2"  (1.575 m), weight 206 lb 11.2 oz (93.8 kg), last menstrual period 10/28/2015. Body mass index is 37.81 kg/m.      Diabetes Self-Management Education - 11/28/15 1749      Visit Information   Visit Type First/Initial     Initial Visit   Diabetes Type Type 2   Are you currently following a meal plan? Yes   What type of meal plan do you follow? "cutting out fried foods, reducing bread and potatoes and pasta   Are you taking your medications as prescribed? Yes   Date Diagnosed approx 1 year     Health Coping   How would you rate your overall health? Good     Psychosocial Assessment   Patient Belief/Attitude about Diabetes Motivated to manage diabetes  "concerned"   Self-care barriers None   Self-management support Doctor's office;Family   Patient Concerns Nutrition/Meal planning;Medication;Monitoring;Healthy Lifestyle;Glycemic Control;Weight Control   Special Needs None   Preferred Learning Style Auditory   Learning Readiness Change in progress   How often do you need to have someone help you when you read instructions, pamphlets, or other written materials from your doctor or pharmacy? 1 - Never   What is the last grade level you completed in school? some college     Pre-Education Assessment   Patient understands the diabetes disease and treatment process. Needs Instruction   Patient understands incorporating nutritional management into lifestyle. Needs Review   Patient undertands incorporating physical activity into lifestyle. Needs Review   Patient understands using medications safely. Needs Instruction   Patient understands monitoring blood glucose, interpreting and using results Needs  Review   Patient understands prevention, detection, and treatment of acute complications. Needs Instruction   Patient understands prevention, detection, and treatment of chronic complications. Needs Review   Patient understands how to develop strategies to address psychosocial issues. Needs Instruction   Patient understands how to develop strategies to promote health/change behavior. Needs Instruction     Complications   Last HgB A1C per patient/outside source 7 %  11/03/15   How often do you check your blood sugar? 1-2 times/day   Fasting Blood glucose range (mg/dL) 130-179  Pt reports fbg's 115-130's mg/dL.    Postprandial Blood glucose range (mg/dL) 130-179  She occasionally checks post meal readings - usually 150's mg/dL.   Have you had a dilated eye exam in the past 12 months? Yes   Have you had a dental exam in the past 12 months? Yes   Are you checking your feet? Yes   How many days per week are you checking your feet? 7     Dietary Intake   Breakfast banana and Belvita bar   Snack (morning) almonds   Lunch sandwich, cheese, cuccumbers   Snack (afternoon) fruit   Dinner chicken, potatoes, vegetables, occasional rice   Beverage(s) water, unsweetened tea     Exercise   Exercise Type Moderate (swimming / aerobic walking)   How many days per week to you exercise? 4   How many minutes per day do you exercise? 30   Total minutes per week of exercise 120     Patient Education   Previous Diabetes Education Yes (please comment)  Gestational diabetes in 2000   Disease state  Definition of diabetes, type 1 and 2, and the diagnosis of diabetes   Nutrition management  Role of diet in the treatment of diabetes and the relationship between the three main macronutrients and blood glucose level   Physical activity and exercise  Role of exercise on diabetes management, blood pressure control and cardiac health.   Medications Reviewed patients medication for diabetes, action, purpose,  timing of dose and side effects.;Affects of steroids on blood sugar   Monitoring Purpose and frequency of SMBG.;Identified appropriate SMBG and/or A1C goals.   Chronic complications Relationship between chronic complications and blood glucose control   Psychosocial adjustment Identified and addressed patients feelings and concerns about diabetes     Individualized Goals (developed by patient)   Reducing Risk Improve blood sugars Decrease medications Prevent diabetes complications Lose weight Lead a healthier lifestyle Become more fit     Outcomes   Expected Outcomes Demonstrated interest in learning. Expect positive outcomes   Future DMSE 4-6 wks      Individualized Plan for Diabetes Self-Management Training:   Learning Objective:  Patient will have a greater understanding of diabetes self-management. Patient education plan is to attend individual and/or group sessions per assessed needs and concerns.   Plan:   Patient Instructions  Check blood sugars 2 x day before breakfast and 2 hrs after supper every day Exercise: Continue exercise program for  30  minutes  4 days a week and gradually increase to 150 minutes/week Eat 3 meals day, 1-2 snacks a day Space meals 4-6 hours apart Bring blood sugar records to the next class   Expected Outcomes:  Demonstrated interest in learning. Expect positive outcomes  Education material provided:  General Meal Planning Guidelines Simple Meal Plan  If problems or questions, patient to contact team via:  Donna Hensley, [place], [place], CDE [phone]  Future DSME appointment: 4-6 wks  December 26, 2015 for Diabetes Class 1

## 2015-12-11 ENCOUNTER — Other Ambulatory Visit: Payer: Self-pay | Admitting: Internal Medicine

## 2015-12-26 ENCOUNTER — Ambulatory Visit: Payer: BLUE CROSS/BLUE SHIELD

## 2015-12-26 ENCOUNTER — Other Ambulatory Visit: Payer: Self-pay | Admitting: Internal Medicine

## 2015-12-28 ENCOUNTER — Telehealth: Payer: Self-pay | Admitting: Dietician

## 2015-12-28 NOTE — Telephone Encounter (Signed)
Called patient regarding class which she missed on 12/26/15. Left voicemail message with dates and times for next 2 class series and requested a call back.

## 2015-12-29 DIAGNOSIS — J3089 Other allergic rhinitis: Secondary | ICD-10-CM | POA: Diagnosis not present

## 2015-12-29 DIAGNOSIS — J4530 Mild persistent asthma, uncomplicated: Secondary | ICD-10-CM | POA: Diagnosis not present

## 2015-12-29 DIAGNOSIS — J309 Allergic rhinitis, unspecified: Secondary | ICD-10-CM | POA: Diagnosis not present

## 2016-01-05 DIAGNOSIS — J3089 Other allergic rhinitis: Secondary | ICD-10-CM | POA: Diagnosis not present

## 2016-01-09 ENCOUNTER — Ambulatory Visit: Payer: BLUE CROSS/BLUE SHIELD

## 2016-01-16 ENCOUNTER — Ambulatory Visit: Payer: BLUE CROSS/BLUE SHIELD

## 2016-01-26 DIAGNOSIS — J301 Allergic rhinitis due to pollen: Secondary | ICD-10-CM | POA: Diagnosis not present

## 2016-01-26 DIAGNOSIS — J3089 Other allergic rhinitis: Secondary | ICD-10-CM | POA: Diagnosis not present

## 2016-01-27 ENCOUNTER — Encounter: Payer: Self-pay | Admitting: *Deleted

## 2016-02-07 DIAGNOSIS — J3089 Other allergic rhinitis: Secondary | ICD-10-CM | POA: Diagnosis not present

## 2016-02-13 ENCOUNTER — Other Ambulatory Visit (INDEPENDENT_AMBULATORY_CARE_PROVIDER_SITE_OTHER): Payer: BLUE CROSS/BLUE SHIELD

## 2016-02-13 DIAGNOSIS — E119 Type 2 diabetes mellitus without complications: Secondary | ICD-10-CM

## 2016-02-13 DIAGNOSIS — I10 Essential (primary) hypertension: Secondary | ICD-10-CM | POA: Diagnosis not present

## 2016-02-13 DIAGNOSIS — E611 Iron deficiency: Secondary | ICD-10-CM

## 2016-02-13 LAB — LIPID PANEL
Cholesterol: 144 mg/dL (ref 0–200)
HDL: 52 mg/dL (ref >39.00)
LDL Cholesterol: 78 mg/dL (ref 0–99)
NonHDL: 92.08
Total CHOL/HDL Ratio: 3
Triglycerides: 71 mg/dL (ref 0.0–149.0)
VLDL: 14.2 mg/dL (ref 0.0–40.0)

## 2016-02-13 LAB — CBC WITH DIFFERENTIAL/PLATELET
Basophils Absolute: 0 10*3/uL (ref 0.0–0.1)
Basophils Relative: 0.5 % (ref 0.0–3.0)
Eosinophils Absolute: 0.2 10*3/uL (ref 0.0–0.7)
Eosinophils Relative: 2.9 % (ref 0.0–5.0)
HCT: 36.3 % (ref 36.0–46.0)
Hemoglobin: 11.8 g/dL — ABNORMAL LOW (ref 12.0–15.0)
Lymphocytes Relative: 24.8 % (ref 12.0–46.0)
Lymphs Abs: 2 10*3/uL (ref 0.7–4.0)
MCHC: 32.6 g/dL (ref 30.0–36.0)
MCV: 69.6 fl — ABNORMAL LOW (ref 78.0–100.0)
Monocytes Absolute: 0.6 10*3/uL (ref 0.1–1.0)
Monocytes Relative: 6.9 % (ref 3.0–12.0)
Neutro Abs: 5.4 10*3/uL (ref 1.4–7.7)
Neutrophils Relative %: 64.9 % (ref 43.0–77.0)
Platelets: 325 10*3/uL (ref 150.0–400.0)
RBC: 5.22 Mil/uL — ABNORMAL HIGH (ref 3.87–5.11)
RDW: 18.5 % — ABNORMAL HIGH (ref 11.5–15.5)
WBC: 8.3 10*3/uL (ref 4.0–10.5)

## 2016-02-13 LAB — MICROALBUMIN / CREATININE URINE RATIO
Creatinine,U: 162.7 mg/dL
Microalb Creat Ratio: 1.7 mg/g (ref 0.0–30.0)
Microalb, Ur: 2.8 mg/dL — ABNORMAL HIGH (ref 0.0–1.9)

## 2016-02-13 LAB — BASIC METABOLIC PANEL
BUN: 10 mg/dL (ref 6–23)
CO2: 26 mEq/L (ref 19–32)
Calcium: 9.2 mg/dL (ref 8.4–10.5)
Chloride: 103 mEq/L (ref 96–112)
Creatinine, Ser: 0.92 mg/dL (ref 0.40–1.20)
GFR: 69.29 mL/min (ref >60.00)
Glucose, Bld: 123 mg/dL — ABNORMAL HIGH (ref 70–99)
Potassium: 3.9 mEq/L (ref 3.5–5.1)
Sodium: 137 mEq/L (ref 135–145)

## 2016-02-13 LAB — HEPATIC FUNCTION PANEL
ALT: 20 U/L (ref 0–35)
AST: 17 U/L (ref 0–37)
Albumin: 4 g/dL (ref 3.5–5.2)
Alkaline Phosphatase: 96 U/L (ref 39–117)
Bilirubin, Direct: 0.1 mg/dL (ref 0.0–0.3)
Total Bilirubin: 0.5 mg/dL (ref 0.2–1.2)
Total Protein: 7.3 g/dL (ref 6.0–8.3)

## 2016-02-13 LAB — TSH: TSH: 1.36 u[IU]/mL (ref 0.35–4.50)

## 2016-02-13 LAB — HEMOGLOBIN A1C: Hgb A1c MFr Bld: 6.9 % — ABNORMAL HIGH (ref 4.6–6.5)

## 2016-02-14 ENCOUNTER — Other Ambulatory Visit (INDEPENDENT_AMBULATORY_CARE_PROVIDER_SITE_OTHER): Payer: BLUE CROSS/BLUE SHIELD

## 2016-02-14 ENCOUNTER — Other Ambulatory Visit: Payer: Self-pay | Admitting: Internal Medicine

## 2016-02-14 ENCOUNTER — Encounter: Payer: Self-pay | Admitting: Internal Medicine

## 2016-02-14 DIAGNOSIS — D649 Anemia, unspecified: Secondary | ICD-10-CM

## 2016-02-14 DIAGNOSIS — J3089 Other allergic rhinitis: Secondary | ICD-10-CM | POA: Diagnosis not present

## 2016-02-14 LAB — IBC PANEL
Iron: 29 ug/dL — ABNORMAL LOW (ref 42–145)
Saturation Ratios: 5.4 % — ABNORMAL LOW (ref 20.0–50.0)
Transferrin: 387 mg/dL — ABNORMAL HIGH (ref 212.0–360.0)

## 2016-02-14 LAB — VITAMIN B12: Vitamin B-12: 462 pg/mL (ref 211–911)

## 2016-02-14 LAB — FERRITIN: Ferritin: 4.2 ng/mL — ABNORMAL LOW (ref 10.0–291.0)

## 2016-02-14 NOTE — Progress Notes (Signed)
Orders placed for add on labs for iron studies and b12.

## 2016-02-15 ENCOUNTER — Ambulatory Visit (INDEPENDENT_AMBULATORY_CARE_PROVIDER_SITE_OTHER): Payer: BLUE CROSS/BLUE SHIELD | Admitting: Internal Medicine

## 2016-02-15 ENCOUNTER — Encounter: Payer: Self-pay | Admitting: Internal Medicine

## 2016-02-15 ENCOUNTER — Other Ambulatory Visit (HOSPITAL_COMMUNITY)
Admission: RE | Admit: 2016-02-15 | Discharge: 2016-02-15 | Disposition: A | Discharge status: Home / Self Care | Payer: BLUE CROSS/BLUE SHIELD | Source: Ambulatory Visit | Attending: Internal Medicine | Admitting: Internal Medicine

## 2016-02-15 VITALS — BP 120/80 | HR 89 | Temp 98.0°F | Ht 63.0 in | Wt 208.6 lb

## 2016-02-15 DIAGNOSIS — J4520 Mild intermittent asthma, uncomplicated: Secondary | ICD-10-CM

## 2016-02-15 DIAGNOSIS — E611 Iron deficiency: Secondary | ICD-10-CM

## 2016-02-15 DIAGNOSIS — E119 Type 2 diabetes mellitus without complications: Secondary | ICD-10-CM

## 2016-02-15 DIAGNOSIS — Z1151 Encounter for screening for human papillomavirus (HPV): Secondary | ICD-10-CM | POA: Insufficient documentation

## 2016-02-15 DIAGNOSIS — Z124 Encounter for screening for malignant neoplasm of cervix: Secondary | ICD-10-CM | POA: Diagnosis not present

## 2016-02-15 DIAGNOSIS — K219 Gastro-esophageal reflux disease without esophagitis: Secondary | ICD-10-CM

## 2016-02-15 DIAGNOSIS — Z0000 Encounter for general adult medical examination without abnormal findings: Secondary | ICD-10-CM | POA: Diagnosis not present

## 2016-02-15 DIAGNOSIS — Z01419 Encounter for gynecological examination (general) (routine) without abnormal findings: Secondary | ICD-10-CM | POA: Diagnosis not present

## 2016-02-15 DIAGNOSIS — I10 Essential (primary) hypertension: Secondary | ICD-10-CM

## 2016-02-15 LAB — HM DIABETES FOOT EXAM

## 2016-02-15 MED ORDER — INTEGRA 62.5-62.5-40-3 MG PO CAPS: 1.0000 | ORAL_CAPSULE | Freq: Every day | ORAL | 3 refills | Status: DC

## 2016-02-15 NOTE — Assessment & Plan Note (Signed)
Physical today 02/15/16.   PAP 10/02/12 - negative with negative HPV.  Mammogram 02/23/15 - Birads I.  F/u mammogram scheduled.

## 2016-02-15 NOTE — Progress Notes (Signed)
Patient ID: Donna Hensley, female   DOB: [birth year], 50 y.o.   MRN: [id number]   Subjective:    Patient ID: Donna Hensley, female    DOB: December 28, 1965, 50 y.o.   MRN: [id number]  HPI  Patient here for her physical exam.  Has diabetes.  Recently saw a nutritionist.  Has adjusted her diet.  a1c improved - 6.9 on recent check.  She is going to the gym. Exercising.  Overall feels better.  Saw allergist.  Taking xyzal now.  Using breo.  Feels breathing is better.  Receiving allergy shots.  No chest pain.  Breathing stable.  No sob.  No acid reflux.  No abdominal pain or cramping.  Bowels stable.  Discussed labs.  Cholesterol looks good.  hgb slightly decreased.  Iron low.  She had trouble with otc iron.  Discussed integra.  States am sugars averaging 130-145.     Past Medical History:  Diagnosis Date  . Allergy   . Asthma   . GERD (gastroesophageal reflux disease)   . Gestational diabetes    Past Surgical History:  Procedure Laterality Date  . APPENDECTOMY  1986  . [place]  . CHOLECYSTECTOMY  2013  . TONSILLECTOMY  1987   Family History  Problem Relation Age of Onset  . Hyperlipidemia Mother   . Osteoporosis Mother   . Hypertension Father   . Arthritis Father   . Sleep apnea Father   . Heart disease Father   . Sleep apnea Brother   . Arthritis Paternal Grandmother   . Diabetes Paternal Grandmother   . Breast cancer Neg Hx    Social History   Social History  . Marital status: Married    Spouse name: N/A  . Number of children: 2  . Years of education: N/A   Occupational History  . 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 Smoking status: Former Smoker    Packs/day: 0.25    Years: 5.00    Types: Cigarettes    Quit date: 09/28/1988  . Smokeless tobacco: Never Used     Comment: smoked briefly as a teenager  . Alcohol use 0.6 oz/week    1 Glasses of wine per week     Comment: Occasional 1-2 per month  . Drug use: No  .  Sexual activity: Not Asked   Other Topics Concern  . None   Social History Narrative   Regular exercise-yes, walk   Diet: fast food, trying to improve diet          Outpatient Encounter Prescriptions as of 02/15/2016  Medication Sig  . albuterol (PROVENTIL HFA;VENTOLIN HFA) 108 (90 BASE) MCG/ACT inhaler Inhale 2 puffs into the lungs every 6 (six) hours as needed for wheezing.  [name] BREO ELLIPTA 200-25 MCG/INH AEPB Inhale 1 puff into the lungs daily.   . Calcium-Magnesium-Vitamin D (CALCIUM 500 PO) Take 1 tablet by mouth daily.  . Cholecalciferol (VITAMIN D3) 1000 units CAPS Take 1 capsule by mouth daily.  . Cyanocobalamin (VITAMIN B 12 PO) Take 1 tablet by mouth daily.  . fluticasone (FLONASE) 50 MCG/ACT nasal spray USE 2 SPRAYS EACH NOSTRIL DAILY (Patient taking differently: Place 2 sprays into both nostrils daily as needed. USE 2 SPRAYS EACH NOSTRIL DAILY)  . levocetirizine (XYZAL) 5 MG tablet Take 5 mg by mouth every evening.  [name] losartan (COZAAR) 25 MG tablet TAKE 1 TABLET DAILY  . metFORMIN (GLUCOPHAGE) 500 MG tablet TAKE 1 TABLET  DAILY  . montelukast (SINGULAIR) 10 MG tablet TAKE 1 TABLET DAILY AT BEDTIME  . Multiple Vitamin (MULTIVITAMIN) tablet Take 1 tablet by mouth daily.  [name] omeprazole (PRILOSEC) 40 MG capsule TAKE 1 CAPSULE DAILY  . Fe Fum-FePoly-Vit C-Vit B3 (INTEGRA) 62.5-62.5-40-3 MG CAPS Take 1 capsule by mouth daily.  . [DISCONTINUED] beclomethasone (QVAR) 80 MCG/ACT inhaler Inhale 2 puffs into the lungs 2 (two) times daily. (Patient not taking: Reported on 11/28/2015)  . [DISCONTINUED] cetirizine (ZYRTEC) 10 MG tablet Take 10 mg by mouth daily.   No facility-administered encounter medications on file as of 02/15/2016.     Review of Systems  Constitutional: Negative for appetite change and unexpected weight change.  HENT: Negative for congestion and sinus pressure.   Respiratory: Negative for cough, chest tightness and shortness of breath.   Cardiovascular: Negative  for chest pain, palpitations and leg swelling.  Gastrointestinal: Negative for abdominal pain, diarrhea, nausea and vomiting.  Genitourinary: Negative for difficulty urinating and dysuria.  Musculoskeletal: Negative for back pain and joint swelling.  Skin: Negative for color change and rash.  Neurological: Negative for dizziness, light-headedness and headaches.  Psychiatric/Behavioral: Negative for agitation and dysphoric mood.       Objective:    Physical Exam  Constitutional: She appears well-developed and well-nourished. No distress.  HENT:  Nose: Nose normal.  Mouth/Throat: Oropharynx is clear and moist.  Neck: Neck supple. No thyromegaly present.  Cardiovascular: Normal rate and regular rhythm.   Pulmonary/Chest: Breath sounds normal. No respiratory distress. She has no wheezes.  Abdominal: Soft. Bowel sounds are normal. There is no tenderness.  Musculoskeletal: She exhibits no edema or tenderness.  Lymphadenopathy:    She has no cervical adenopathy.  Skin: No rash noted. No erythema.  Psychiatric: She has a normal mood and affect. Her behavior is normal.    BP 120/80   Pulse 89   Temp 98 F (36.7 C) (Oral)   Ht _0  (1.6 m)   Wt 208 lb 9.6 oz (94.6 kg)   LMP 02/12/2016   SpO2 98%   BMI 36.95 kg/m  Wt Readings from Last 3 Encounters:  02/15/16 208 lb 9.6 oz (94.6 kg)  11/28/15 206 lb 11.2 oz (93.8 kg)  11/11/15 207 lb 2 oz (94 kg)     Lab Results  Component Value Date   WBC 8.3 02/13/2016   HGB 11.8 (L) 02/13/2016   HCT 36.3 02/13/2016   PLT 325.0 02/13/2016   GLUCOSE 123 (H) 02/13/2016   CHOL 144 02/13/2016   TRIG 71.0 02/13/2016   HDL 52.00 02/13/2016   LDLCALC 78 02/13/2016   ALT 20 02/13/2016   AST 17 02/13/2016   NA 137 02/13/2016   K 3.9 02/13/2016   CL 103 02/13/2016   CREATININE 0.92 02/13/2016   BUN 10 02/13/2016   CO2 26 02/13/2016   TSH 1.36 02/13/2016   HGBA1C 6.9 (H) 02/13/2016   MICROALBUR 2.8 (H) 02/13/2016    Dg Chest 2  View  Result Date: 05/18/2015 CLINICAL DATA:  Persistent cough. EXAM: CHEST  2 VIEW COMPARISON:  08/26/2013 . FINDINGS: Mediastinum hilar structures normal. Low lung volumes with mild bibasilar atelectasis. No pleural effusion or pneumothorax. Heart size normal . IMPRESSION: Low lung volumes with mild bibasilar atelectasis . Electronically Signed   By: Marcello Moores  Register   On: 05/18/2015 10:47       Assessment & Plan:   Problem List Items Addressed This Visit    Diabetes ([place])    a1c just checked  6.9.  Has adjusted her diet.  Follow met b and a1c.  Up to date with eye checks.        Essential hypertension    Blood pressure under good control.  Continue same medication regimen.  Follow pressures.  Follow metabolic panel.        GERD    Controlled on omeprazole.  Follow.       Health care maintenance    Physical today 02/15/16.   PAP 10/02/12 - negative with negative HPV.  Mammogram 02/23/15 - Birads I.  F/u mammogram scheduled.        Iron deficiency    Discussed labs.  hgb slightly decreased.  Iron low.  Start integra daily.  Follow.       Relevant Orders   CBC with Differential/Platelet   Ferritin   Mild intermittent asthma in adult without complication    Seeing an allergist.  On breo.  Receiving allergy shots.  Doing better.  Follow.       Relevant Medications   BREO ELLIPTA 200-25 MCG/INH AEPB    Other Visit Diagnoses    Cervical cancer screening    -  Primary   Relevant Orders   Cytology - PAP       Einar Pheasant, MD

## 2016-02-15 NOTE — Progress Notes (Signed)
Pap Pre visit review using our clinic review tool, if applicable. No additional management support is needed unless otherwise documented below in the visit note.

## 2016-02-16 ENCOUNTER — Encounter: Payer: Self-pay | Admitting: Internal Medicine

## 2016-02-16 DIAGNOSIS — J3089 Other allergic rhinitis: Secondary | ICD-10-CM | POA: Diagnosis not present

## 2016-02-16 NOTE — Assessment & Plan Note (Signed)
Controlled on omeprazole.  Follow.  

## 2016-02-16 NOTE — Assessment & Plan Note (Signed)
Seeing an allergist.  On breo.  Receiving allergy shots.  Doing better.  Follow.

## 2016-02-16 NOTE — Assessment & Plan Note (Signed)
a1c just checked 6.9.  Has adjusted her diet.  Follow met b and a1c.  Up to date with eye checks.

## 2016-02-16 NOTE — Assessment & Plan Note (Signed)
Blood pressure under good control.  Continue same medication regimen.  Follow pressures.  Follow metabolic panel.   

## 2016-02-16 NOTE — Assessment & Plan Note (Signed)
Discussed labs.  hgb slightly decreased.  Iron low.  Start integra daily.  Follow.

## 2016-02-17 LAB — CYTOLOGY - PAP
Diagnosis: NEGATIVE
HPV: NOT DETECTED

## 2016-02-18 ENCOUNTER — Encounter: Payer: Self-pay | Admitting: Internal Medicine

## 2016-02-21 DIAGNOSIS — J3089 Other allergic rhinitis: Secondary | ICD-10-CM | POA: Diagnosis not present

## 2016-02-23 DIAGNOSIS — J3089 Other allergic rhinitis: Secondary | ICD-10-CM | POA: Diagnosis not present

## 2016-02-27 ENCOUNTER — Ambulatory Visit: Payer: BLUE CROSS/BLUE SHIELD

## 2016-03-01 DIAGNOSIS — J3089 Other allergic rhinitis: Secondary | ICD-10-CM | POA: Diagnosis not present

## 2016-03-06 DIAGNOSIS — J3089 Other allergic rhinitis: Secondary | ICD-10-CM | POA: Diagnosis not present

## 2016-03-13 DIAGNOSIS — J3089 Other allergic rhinitis: Secondary | ICD-10-CM | POA: Diagnosis not present

## 2016-03-15 DIAGNOSIS — J3089 Other allergic rhinitis: Secondary | ICD-10-CM | POA: Diagnosis not present

## 2016-03-19 ENCOUNTER — Encounter: Payer: Self-pay | Admitting: Internal Medicine

## 2016-03-20 ENCOUNTER — Ambulatory Visit
Admission: RE | Admit: 2016-03-20 | Discharge: 2016-03-20 | Disposition: A | Discharge status: Home / Self Care | Payer: BLUE CROSS/BLUE SHIELD | Source: Ambulatory Visit | Attending: Internal Medicine | Admitting: Internal Medicine

## 2016-03-20 DIAGNOSIS — Z1231 Encounter for screening mammogram for malignant neoplasm of breast: Secondary | ICD-10-CM | POA: Diagnosis not present

## 2016-03-20 DIAGNOSIS — J3089 Other allergic rhinitis: Secondary | ICD-10-CM | POA: Diagnosis not present

## 2016-03-20 DIAGNOSIS — Z1239 Encounter for other screening for malignant neoplasm of breast: Secondary | ICD-10-CM

## 2016-03-20 LAB — HM MAMMOGRAPHY

## 2016-03-27 DIAGNOSIS — J3089 Other allergic rhinitis: Secondary | ICD-10-CM | POA: Diagnosis not present

## 2016-03-29 DIAGNOSIS — J3089 Other allergic rhinitis: Secondary | ICD-10-CM | POA: Diagnosis not present

## 2016-04-03 DIAGNOSIS — J3089 Other allergic rhinitis: Secondary | ICD-10-CM | POA: Diagnosis not present

## 2016-04-05 ENCOUNTER — Other Ambulatory Visit (INDEPENDENT_AMBULATORY_CARE_PROVIDER_SITE_OTHER): Payer: BLUE CROSS/BLUE SHIELD

## 2016-04-05 DIAGNOSIS — E611 Iron deficiency: Secondary | ICD-10-CM | POA: Diagnosis not present

## 2016-04-05 DIAGNOSIS — J4530 Mild persistent asthma, uncomplicated: Secondary | ICD-10-CM | POA: Diagnosis not present

## 2016-04-05 DIAGNOSIS — H1045 Other chronic allergic conjunctivitis: Secondary | ICD-10-CM | POA: Diagnosis not present

## 2016-04-05 DIAGNOSIS — J3089 Other allergic rhinitis: Secondary | ICD-10-CM | POA: Diagnosis not present

## 2016-04-05 LAB — CBC WITH DIFFERENTIAL/PLATELET
Basophils Absolute: 0.1 10*3/uL (ref 0.0–0.1)
Basophils Relative: 0.7 % (ref 0.0–3.0)
Eosinophils Absolute: 0.3 10*3/uL (ref 0.0–0.7)
Eosinophils Relative: 2.7 % (ref 0.0–5.0)
HCT: 41.7 % (ref 36.0–46.0)
Hemoglobin: 13.8 g/dL (ref 12.0–15.0)
Lymphocytes Relative: 30.3 % (ref 12.0–46.0)
Lymphs Abs: 2.9 10*3/uL (ref 0.7–4.0)
MCHC: 33.1 g/dL (ref 30.0–36.0)
MCV: 74.9 fl — ABNORMAL LOW (ref 78.0–100.0)
Monocytes Absolute: 0.5 10*3/uL (ref 0.1–1.0)
Monocytes Relative: 5.5 % (ref 3.0–12.0)
Neutro Abs: 5.8 10*3/uL (ref 1.4–7.7)
Neutrophils Relative %: 60.8 % (ref 43.0–77.0)
Platelets: 348 10*3/uL (ref 150.0–400.0)
RBC: 5.56 Mil/uL — ABNORMAL HIGH (ref 3.87–5.11)
RDW: 22.5 % — ABNORMAL HIGH (ref 11.5–15.5)
WBC: 9.5 10*3/uL (ref 4.0–10.5)

## 2016-04-05 LAB — FERRITIN: Ferritin: 6.3 ng/mL — ABNORMAL LOW (ref 10.0–291.0)

## 2016-04-06 ENCOUNTER — Encounter: Payer: Self-pay | Admitting: Internal Medicine

## 2016-04-12 DIAGNOSIS — J3089 Other allergic rhinitis: Secondary | ICD-10-CM | POA: Diagnosis not present

## 2016-05-01 DIAGNOSIS — J3089 Other allergic rhinitis: Secondary | ICD-10-CM | POA: Diagnosis not present

## 2016-05-08 DIAGNOSIS — J3089 Other allergic rhinitis: Secondary | ICD-10-CM | POA: Diagnosis not present

## 2016-05-15 DIAGNOSIS — J3089 Other allergic rhinitis: Secondary | ICD-10-CM | POA: Diagnosis not present

## 2016-05-24 DIAGNOSIS — J3089 Other allergic rhinitis: Secondary | ICD-10-CM | POA: Diagnosis not present

## 2016-05-28 ENCOUNTER — Encounter: Payer: Self-pay | Admitting: Family

## 2016-05-28 ENCOUNTER — Ambulatory Visit (INDEPENDENT_AMBULATORY_CARE_PROVIDER_SITE_OTHER): Payer: BLUE CROSS/BLUE SHIELD | Admitting: Family

## 2016-05-28 VITALS — BP 118/84 | HR 100 | Temp 98.3°F | Ht 63.0 in | Wt 206.2 lb

## 2016-05-28 DIAGNOSIS — J209 Acute bronchitis, unspecified: Secondary | ICD-10-CM | POA: Diagnosis not present

## 2016-05-28 MED ORDER — HYDROCOD POLST-CPM POLST ER 10-8 MG/5ML PO SUER: 5.0000 mL | Freq: Two times a day (BID) | ORAL | 0 refills | Status: DC | PRN

## 2016-05-28 NOTE — Patient Instructions (Signed)
As discussed, was treated conservatively as suspect viral illness. Cough syrup as needed at bedtime. Mucinex twice daily with lots of water. Let me know if you're not better.

## 2016-05-28 NOTE — Progress Notes (Signed)
Pre visit review using our clinic review tool, if applicable. No additional management support is needed unless otherwise documented below in the visit note. 

## 2016-05-28 NOTE — Progress Notes (Signed)
Subjective:    Patient ID: Donna Hensley, female    DOB: 09-27-1965, 51 y.o.   MRN: [id number]  CC: Donna Hensley is a 51 y.o. female who presents today for an acute visit.    HPI: CC: productive cough x 3 days, worsening. Coughing throughout the day.  Endorses sore throat, sinus pressure. Tried mucinex with some relief. No wheezing, SOB, fever, vision changes.   Normally has to take prednisone.  Has allergy shots.   History of allergic rhinitis. Uses Breo QD and albuterol inhaler when necessary.    HISTORY:  Past Medical History:  Diagnosis Date  . Allergy   . Asthma   . GERD (gastroesophageal reflux disease)   . Gestational diabetes    Past Surgical History:  Procedure Laterality Date  . APPENDECTOMY  1986  . [place]  . CHOLECYSTECTOMY  2013  . TONSILLECTOMY  1987   Family History  Problem Relation Age of Onset  . Hyperlipidemia Mother   . Osteoporosis Mother   . Hypertension Father   . Arthritis Father   . Sleep apnea Father   . Heart disease Father   . Sleep apnea Brother   . Arthritis Paternal Grandmother   . Diabetes Paternal Grandmother   . Breast cancer Neg Hx     Allergies: Doxycycline and Oxycodone Current Outpatient Prescriptions on File Prior to Visit  Medication Sig Dispense Refill  . albuterol (PROVENTIL HFA;VENTOLIN HFA) 108 (90 BASE) MCG/ACT inhaler Inhale 2 puffs into the lungs every 6 (six) hours as needed for wheezing. 6.7 g 2  . BREO ELLIPTA 200-25 MCG/INH AEPB Inhale 1 puff into the lungs daily.     . Calcium-Magnesium-Vitamin D (CALCIUM 500 PO) Take 1 tablet by mouth daily.    . Cholecalciferol (VITAMIN D3) 1000 units CAPS Take 1 capsule by mouth daily.    . Cyanocobalamin (VITAMIN B 12 PO) Take 1 tablet by mouth daily.    . Fe Fum-FePoly-Vit C-Vit B3 (INTEGRA) 62.5-62.5-40-3 MG CAPS Take 1 capsule by mouth daily. 30 capsule 3  . fluticasone (FLONASE) 50 MCG/ACT nasal spray USE 2 SPRAYS EACH NOSTRIL DAILY  (Patient taking differently: Place 2 sprays into both nostrils daily as needed. USE 2 SPRAYS EACH NOSTRIL DAILY) 48 g 3  . levocetirizine (XYZAL) 5 MG tablet Take 5 mg by mouth every evening.  3  . losartan (COZAAR) 25 MG tablet TAKE 1 TABLET DAILY 90 tablet 1  . metFORMIN (GLUCOPHAGE) 500 MG tablet TAKE 1 TABLET DAILY 90 tablet 1  . montelukast (SINGULAIR) 10 MG tablet TAKE 1 TABLET DAILY AT BEDTIME 90 tablet 3  . Multiple Vitamin (MULTIVITAMIN) tablet Take 1 tablet by mouth daily.    [name] omeprazole (PRILOSEC) 40 MG capsule TAKE 1 CAPSULE DAILY 90 capsule 2   No current facility-administered medications on file prior to visit.     Social History  Substance Use Topics  . Smoking status: Former Smoker    Packs/day: 0.25    Years: 5.00    Types: Cigarettes    Quit date: 09/28/1988  . Smokeless tobacco: Never Used     Comment: smoked briefly as a teenager  . Alcohol use 0.6 oz/week    1 Glasses of wine per week     Comment: Occasional 1-2 per month    Review of Systems  Constitutional: Negative for chills and fever.  HENT: Positive for congestion, sinus pressure and sore throat. Negative for ear pain.   Eyes: Negative for  visual disturbance.  Respiratory: Positive for cough. Negative for shortness of breath and wheezing.   Cardiovascular: Negative for chest pain and palpitations.  Gastrointestinal: Negative for nausea and vomiting.  Neurological: Negative for headaches.      Objective:    BP 118/84   Pulse 100   Temp 98.3 F (36.8 C) (Oral)   Ht 5\' 3"  (1.6 m)   Wt 206 lb 3.2 oz (93.5 kg)   LMP 05/14/2016   SpO2 98%   BMI 36.53 kg/m    Physical Exam  Constitutional: She appears well-developed and well-nourished.  HENT:  Head: Normocephalic and atraumatic.  Right Ear: Hearing, tympanic membrane, external ear and ear canal normal. No drainage, swelling or tenderness. No foreign bodies. Tympanic membrane is not erythematous and not bulging. No middle ear effusion. No  decreased hearing is noted.  Left Ear: Hearing, tympanic membrane, external ear and ear canal normal. No drainage, swelling or tenderness. No foreign bodies. Tympanic membrane is not erythematous and not bulging.  No middle ear effusion. No decreased hearing is noted.  Nose: Nose normal. No rhinorrhea. Right sinus exhibits no maxillary sinus tenderness and no frontal sinus tenderness. Left sinus exhibits no maxillary sinus tenderness and no frontal sinus tenderness.  Mouth/Throat: Uvula is midline, oropharynx is clear and moist and mucous membranes are normal. No oropharyngeal exudate, posterior oropharyngeal edema, posterior oropharyngeal erythema or tonsillar abscesses.  Eyes: Conjunctivae are normal.  Cardiovascular: Regular rhythm, normal heart sounds and normal pulses.   Pulmonary/Chest: Effort normal and breath sounds normal. She has no wheezes. She has no rhonchi. She has no rales.  Lymphadenopathy:       Head (right side): No submental, no submandibular, no tonsillar, no preauricular, no posterior auricular and no occipital adenopathy present.       Head (left side): No submental, no submandibular, no tonsillar, no preauricular, no posterior auricular and no occipital adenopathy present.    She has no cervical adenopathy.  Neurological: She is alert.  Skin: Skin is warm and dry.  Psychiatric: She has a normal mood and affect. Her speech is normal and behavior is normal. Thought content normal.  Vitals reviewed.      Assessment & Plan:  1. Acute bronchitis, unspecified organism Afebrile. No adventitious lung sounds. Patient and I jointly decided to treat conservatively and delay antibiotics. If patient does not respond on conservative measures, we also discussed a short prednisone taper. Return precautions given. - chlorpheniramine-HYDROcodone (TUSSIONEX PENNKINETIC ER) 10-8 MG/5ML SUER; Take 5 mLs by mouth every 12 (twelve) hours as needed.  Dispense: 115 mL; Refill: 0     I am  having Ms. Whiting maintain her multivitamin, albuterol, montelukast, fluticasone, Cyanocobalamin (VITAMIN B 12 PO), Calcium-Magnesium-Vitamin D (CALCIUM 500 PO), Vitamin D3, omeprazole, metFORMIN, losartan, BREO ELLIPTA, levocetirizine, INTEGRA, and chlorpheniramine-HYDROcodone.   Meds ordered this encounter  Medications  . chlorpheniramine-HYDROcodone (TUSSIONEX PENNKINETIC ER) 10-8 MG/5ML SUER    Sig: Take 5 mLs by mouth every 12 (twelve) hours as needed.    Dispense:  115 mL    Refill:  0    Order Specific Question:   Supervising Provider    Answer:   Crecencio Mc [[id number]]    Return precautions given.   Risks, benefits, and alternatives of the medications and treatment plan prescribed today were discussed, and patient expressed understanding.   Education regarding symptom management and diagnosis given to patient on AVS.  Continue to follow with Einar Pheasant, MD for routine health maintenance.   Okey Dupre  Borquez and I agreed with plan.   Mable Paris, FNP

## 2016-05-30 ENCOUNTER — Encounter: Payer: Self-pay | Admitting: Family

## 2016-05-30 ENCOUNTER — Ambulatory Visit (INDEPENDENT_AMBULATORY_CARE_PROVIDER_SITE_OTHER): Payer: BLUE CROSS/BLUE SHIELD | Admitting: Family

## 2016-05-30 VITALS — BP 136/86 | HR 108 | Temp 98.5°F | Ht 63.0 in | Wt 207.0 lb

## 2016-05-30 DIAGNOSIS — J40 Bronchitis, not specified as acute or chronic: Secondary | ICD-10-CM

## 2016-05-30 MED ORDER — CEFDINIR 300 MG PO CAPS
300.0000 mg | ORAL_CAPSULE | Freq: Two times a day (BID) | ORAL | 0 refills | Status: DC
Start: 2016-05-30 — End: 2016-06-20

## 2016-05-30 MED ORDER — ALBUTEROL SULFATE HFA 108 (90 BASE) MCG/ACT IN AERS: 2.0000 | INHALATION_SPRAY | Freq: Four times a day (QID) | RESPIRATORY_TRACT | 2 refills | Status: DC | PRN

## 2016-05-30 NOTE — Patient Instructions (Signed)
Probiotics if start omnicef  I suspect that your infection is viral in nature.  As discussed, I advise that you wait to fill the antibiotic after 1-2 days of symptom management to see if your symptoms improve. If you do not show improvement, you may take the antibiotic as prescribed.   Increase intake of clear fluids. Congestion is best treated by hydration, when mucus is wetter, it is thinner, less sticky, and easier to expel from the body, either through coughing up drainage, or by blowing your nose.   Get plenty of rest.   Use saline nasal drops and blow your nose frequently. Run a humidifier at night and elevate the head of the bed. Vicks Vapor rub will help with congestion and cough. Steam showers and sinus massage for congestion.   Use Acetaminophen or Ibuprofen as needed for fever or pain. Avoid second hand smoke. Even the smallest exposure will worsen symptoms.   You can also try a teaspoon of honey to see if this will help reduce cough. Throat lozenges can sometimes be beneficial as well.    This illness will typically last 7 - 10 days.   Please follow up with our clinic if you develop a fever greater than 101 F, symptoms worsen, or do not resolve in the next week.

## 2016-05-30 NOTE — Progress Notes (Signed)
Pre visit review using our clinic review tool, if applicable. No additional management support is needed unless otherwise documented below in the visit note. 

## 2016-05-30 NOTE — Progress Notes (Signed)
Subjective:    Patient ID: Donna Hensley, female    DOB: [birth year], 51 y.o.   MRN: [id number]  CC: Donna Hensley is a 51 y.o. female who presents today for follow up.   HPI: Here for follow up after 2 days, started on prn cough medication Continues to cough and congestion. Sinus pressure. No HA, vision changes,fever, chills.  Former smoker. On Breo. Would like refill of albuterol inhaler.       HISTORY:  Past Medical History:  Diagnosis Date  . Allergy   . Asthma   . GERD (gastroesophageal reflux disease)   . Gestational diabetes    Past Surgical History:  Procedure Laterality Date  . APPENDECTOMY  1986  . [place]  . CHOLECYSTECTOMY  2013  . TONSILLECTOMY  1987   Family History  Problem Relation Age of Onset  . Hyperlipidemia Mother   . Osteoporosis Mother   . Hypertension Father   . Arthritis Father   . Sleep apnea Father   . Heart disease Father   . Sleep apnea Brother   . Arthritis Paternal Grandmother   . Diabetes Paternal Grandmother   . Breast cancer Neg Hx     Allergies: Doxycycline and Oxycodone Current Outpatient Prescriptions on File Prior to Visit  Medication Sig Dispense Refill  . BREO ELLIPTA 200-25 MCG/INH AEPB Inhale 1 puff into the lungs daily.     . Calcium-Magnesium-Vitamin D (CALCIUM 500 PO) Take 1 tablet by mouth daily.    . chlorpheniramine-HYDROcodone (TUSSIONEX PENNKINETIC ER) 10-8 MG/5ML SUER Take 5 mLs by mouth every 12 (twelve) hours as needed. 115 mL 0  . Cholecalciferol (VITAMIN D3) 1000 units CAPS Take 1 capsule by mouth daily.    . Cyanocobalamin (VITAMIN B 12 PO) Take 1 tablet by mouth daily.    . Fe Fum-FePoly-Vit C-Vit B3 (INTEGRA) 62.5-62.5-40-3 MG CAPS Take 1 capsule by mouth daily. 30 capsule 3  . fluticasone (FLONASE) 50 MCG/ACT nasal spray USE 2 SPRAYS EACH NOSTRIL DAILY (Patient taking differently: Place 2 sprays into both nostrils daily as needed. USE 2 SPRAYS EACH NOSTRIL DAILY) 48 g 3    . levocetirizine (XYZAL) 5 MG tablet Take 5 mg by mouth every evening.  3  . losartan (COZAAR) 25 MG tablet TAKE 1 TABLET DAILY 90 tablet 1  . metFORMIN (GLUCOPHAGE) 500 MG tablet TAKE 1 TABLET DAILY 90 tablet 1  . montelukast (SINGULAIR) 10 MG tablet TAKE 1 TABLET DAILY AT BEDTIME 90 tablet 3  . Multiple Vitamin (MULTIVITAMIN) tablet Take 1 tablet by mouth daily.    [name] omeprazole (PRILOSEC) 40 MG capsule TAKE 1 CAPSULE DAILY 90 capsule 2   No current facility-administered medications on file prior to visit.     Social History  Substance Use Topics  . Smoking status: Former Smoker    Packs/day: 0.25    Years: 5.00    Types: Cigarettes    Quit date: 09/28/1988  . Smokeless tobacco: Never Used     Comment: smoked briefly as a teenager  . Alcohol use 0.6 oz/week    1 Glasses of wine per week     Comment: Occasional 1-2 per month    Review of Systems  Constitutional: Negative for chills and fever.  HENT: Positive for congestion and sinus pressure. Negative for ear pain and sore throat.   Respiratory: Positive for cough. Negative for shortness of breath and wheezing.   Cardiovascular: Negative for chest pain and palpitations.  Gastrointestinal: Negative  for nausea and vomiting.      Objective:    BP 136/86   Pulse (!) 108   Temp 98.5 F (36.9 C) (Oral)   Ht 5\' 3"  (1.6 m)   Wt 207 lb (93.9 kg)   LMP 05/14/2016   SpO2 97%   BMI 36.67 kg/m  BP Readings from Last 3 Encounters:  05/30/16 136/86  05/28/16 118/84  02/15/16 120/80   Wt Readings from Last 3 Encounters:  05/30/16 207 lb (93.9 kg)  05/28/16 206 lb 3.2 oz (93.5 kg)  02/15/16 208 lb 9.6 oz (94.6 kg)    Physical Exam  Constitutional: She appears well-developed and well-nourished.  HENT:  Head: Normocephalic and atraumatic.  Right Ear: Hearing, tympanic membrane, external ear and ear canal normal. No drainage, swelling or tenderness. No foreign bodies. Tympanic membrane is not erythematous and not bulging. No  middle ear effusion. No decreased hearing is noted.  Left Ear: Hearing, tympanic membrane, external ear and ear canal normal. No drainage, swelling or tenderness. No foreign bodies. Tympanic membrane is not erythematous and not bulging.  No middle ear effusion. No decreased hearing is noted.  Nose: Nose normal. No rhinorrhea. Right sinus exhibits no maxillary sinus tenderness and no frontal sinus tenderness. Left sinus exhibits no maxillary sinus tenderness and no frontal sinus tenderness.  Mouth/Throat: Uvula is midline, oropharynx is clear and moist and mucous membranes are normal. No oropharyngeal exudate, posterior oropharyngeal edema, posterior oropharyngeal erythema or tonsillar abscesses.  Eyes: Conjunctivae are normal.  Cardiovascular: Regular rhythm, normal heart sounds and normal pulses.   Pulmonary/Chest: Effort normal and breath sounds normal. She has no wheezes. She has no rhonchi. She has no rales.  Lymphadenopathy:       Head (right side): No submental, no submandibular, no tonsillar, no preauricular, no posterior auricular and no occipital adenopathy present.       Head (left side): No submental, no submandibular, no tonsillar, no preauricular, no posterior auricular and no occipital adenopathy present.    She has no cervical adenopathy.  Neurological: She is alert.  Skin: Skin is warm and dry.  Psychiatric: She has a normal mood and affect. Her speech is normal and behavior is normal. Thought content normal.  Vitals reviewed.      Assessment & Plan:   1. Bronchitis Afebrile. No adventitious lung sounds. Symptoms are not improving, I went ahead and prescribed antibiotic. Advised patient to wait and treat symptomatically for 1-2 more days which she agreed with. Duration of illness is still less than 7 days. Return precautions given.   - albuterol (PROVENTIL HFA;VENTOLIN HFA) 108 (90 Base) MCG/ACT inhaler; Inhale 2 puffs into the lungs every 6 (six) hours as needed for wheezing.   Dispense: 6.7 g; Refill: 2 - cefdinir (OMNICEF) 300 MG capsule; Take 1 capsule (300 mg total) by mouth 2 (two) times daily.  Dispense: 20 capsule; Refill: 0   I am having Ms. Pallone start on cefdinir. I am also having her maintain her multivitamin, montelukast, fluticasone, Cyanocobalamin (VITAMIN B 12 PO), Calcium-Magnesium-Vitamin D (CALCIUM 500 PO), Vitamin D3, omeprazole, metFORMIN, losartan, BREO ELLIPTA, levocetirizine, INTEGRA, chlorpheniramine-HYDROcodone, and albuterol.   Meds ordered this encounter  Medications  . albuterol (PROVENTIL HFA;VENTOLIN HFA) 108 (90 Base) MCG/ACT inhaler    Sig: Inhale 2 puffs into the lungs every 6 (six) hours as needed for wheezing.    Dispense:  6.7 g    Refill:  2  . cefdinir (OMNICEF) 300 MG capsule    Sig: Take 1  capsule (300 mg total) by mouth 2 (two) times daily.    Dispense:  20 capsule    Refill:  0    Order Specific Question:   Supervising Provider    Answer:   Crecencio Mc [[id number]]    Return precautions given.   Risks, benefits, and alternatives of the medications and treatment plan prescribed today were discussed, and patient expressed understanding.   Education regarding symptom management and diagnosis given to patient on AVS.  Continue to follow with Einar Pheasant, MD for routine health maintenance.   Donna Hensley and I agreed with plan.   Mable Paris, FNP

## 2016-06-05 ENCOUNTER — Telehealth: Payer: Self-pay | Admitting: *Deleted

## 2016-06-05 NOTE — Telephone Encounter (Signed)
Patient advised to come in for appointment 06/06/16 . Would you please add to schedule for cough . Thank you .

## 2016-06-05 NOTE — Telephone Encounter (Signed)
I can see her tomorrow afternoon (add on) to schedule if she wants me to see her.  She will be worked in.

## 2016-06-05 NOTE — Telephone Encounter (Signed)
Reason for call: cough no better  Symptoms: cough not better at all , brownish yellowish phlegm  tussionex helped some, chest congestion no fever,  Duration 05/30/2016 Medications: cefdinir  started on Sunday  , mucinex , tussionex helps some almost out,  Last seen for this problem:05/30/2016 Seen by: Mable Paris, FNP

## 2016-06-05 NOTE — Telephone Encounter (Signed)
Pt currently has symptoms cough and congestion, this is day twelve for pt., she has been seen by the nurse NP in this office. Pt requested to be seen by Dr.Scott only, if pt should come in please give a time and date.

## 2016-06-05 NOTE — Telephone Encounter (Signed)
Patient would like to be seen again , ok to be worked in tomorrow pm.

## 2016-06-05 NOTE — Telephone Encounter (Signed)
Ok.  Have her come in at 2:00.

## 2016-06-06 ENCOUNTER — Ambulatory Visit (INDEPENDENT_AMBULATORY_CARE_PROVIDER_SITE_OTHER): Payer: BLUE CROSS/BLUE SHIELD | Admitting: Internal Medicine

## 2016-06-06 ENCOUNTER — Encounter: Payer: Self-pay | Admitting: Internal Medicine

## 2016-06-06 DIAGNOSIS — J988 Other specified respiratory disorders: Secondary | ICD-10-CM

## 2016-06-06 DIAGNOSIS — J209 Acute bronchitis, unspecified: Secondary | ICD-10-CM

## 2016-06-06 MED ORDER — PREDNISONE 10 MG PO TABS: ORAL_TABLET | ORAL | 0 refills | Status: DC

## 2016-06-06 MED ORDER — HYDROCOD POLST-CPM POLST ER 10-8 MG/5ML PO SUER: 5.0000 mL | Freq: Two times a day (BID) | ORAL | 0 refills | Status: DC | PRN

## 2016-06-06 NOTE — Progress Notes (Signed)
Pre-visit discussion using our clinic review tool. No additional management support is needed unless otherwise documented below in the visit note.  

## 2016-06-06 NOTE — Progress Notes (Signed)
Patient ID: Donna Hensley, female   DOB: 05/27/1965, 51 y.o.   MRN: [id number]   Subjective:    Patient ID: Donna Hensley, female    DOB: 09-Nov-1965, 51 y.o.   MRN: [id number]  HPI  Patient here as a work in with concerns regarding increased cough and congestion.  She was seen initially on 05/30/16 with cough and congestion.  Note reviewed.  Was placed on omnicef.  Instructed to use inhalers.  She reports decreased head congestion.  Taking mucinex.  Still with increased chest congestion and cough.  Sleeping a lot.  Fatigue.  She is eating.  No nausea or vomiting.  Bowels stable.  Reports sugars have been stable.     Past Medical History:  Diagnosis Date  . Allergy   . Asthma   . GERD (gastroesophageal reflux disease)   . Gestational diabetes    Past Surgical History:  Procedure Laterality Date  . APPENDECTOMY  1986  . [place]  . CHOLECYSTECTOMY  2013  . TONSILLECTOMY  1987   Family History  Problem Relation Age of Onset  . Hyperlipidemia Mother   . Osteoporosis Mother   . Hypertension Father   . Arthritis Father   . Sleep apnea Father   . Heart disease Father   . Sleep apnea Brother   . Arthritis Paternal Grandmother   . Diabetes Paternal Grandmother   . Breast cancer Neg Hx    Social History   Social History  . Marital status: Married    Spouse name: N/A  . Number of children: 2  . Years of education: N/A   Occupational History  . 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 Smoking status: Former Smoker    Packs/day: 0.25    Years: 5.00    Types: Cigarettes    Quit date: 09/28/1988  . Smokeless tobacco: Never Used     Comment: smoked briefly as a teenager  . Alcohol use 0.6 oz/week    1 Glasses of wine per week     Comment: Occasional 1-2 per month  . Drug use: No  . Sexual activity: Not Asked   Other Topics Concern  . None   Social History Narrative   Regular exercise-yes, walk   Diet: fast food, trying  to improve diet          Outpatient Encounter Prescriptions as of 06/06/2016  Medication Sig  . albuterol (PROVENTIL HFA;VENTOLIN HFA) 108 (90 Base) MCG/ACT inhaler Inhale 2 puffs into the lungs every 6 (six) hours as needed for wheezing.  [name] BREO ELLIPTA 200-25 MCG/INH AEPB Inhale 1 puff into the lungs daily.   . Calcium-Magnesium-Vitamin D (CALCIUM 500 PO) Take 1 tablet by mouth daily.  . cefdinir (OMNICEF) 300 MG capsule Take 1 capsule (300 mg total) by mouth 2 (two) times daily.  . chlorpheniramine-HYDROcodone (TUSSIONEX PENNKINETIC ER) 10-8 MG/5ML SUER Take 5 mLs by mouth every 12 (twelve) hours as needed.  . Cholecalciferol (VITAMIN D3) 1000 units CAPS Take 1 capsule by mouth daily.  . Cyanocobalamin (VITAMIN B 12 PO) Take 1 tablet by mouth daily.  . Fe Fum-FePoly-Vit C-Vit B3 (INTEGRA) 62.5-62.5-40-3 MG CAPS Take 1 capsule by mouth daily.  . fluticasone (FLONASE) 50 MCG/ACT nasal spray USE 2 SPRAYS EACH NOSTRIL DAILY (Patient taking differently: Place 2 sprays into both nostrils daily as needed. USE 2 SPRAYS EACH NOSTRIL DAILY)  . levocetirizine (XYZAL) 5 MG tablet Take 5 mg by mouth every  evening.  [name] losartan (COZAAR) 25 MG tablet TAKE 1 TABLET DAILY  . metFORMIN (GLUCOPHAGE) 500 MG tablet TAKE 1 TABLET DAILY  . montelukast (SINGULAIR) 10 MG tablet TAKE 1 TABLET DAILY AT BEDTIME  . Multiple Vitamin (MULTIVITAMIN) tablet Take 1 tablet by mouth daily.  [name] omeprazole (PRILOSEC) 40 MG capsule TAKE 1 CAPSULE DAILY  . [DISCONTINUED] chlorpheniramine-HYDROcodone (TUSSIONEX PENNKINETIC ER) 10-8 MG/5ML SUER Take 5 mLs by mouth every 12 (twelve) hours as needed.  . predniSONE (DELTASONE) 10 MG tablet Take 6 tablets x 1 day and then decrease by 1/2 tablet per day until down to zero mg.   No facility-administered encounter medications on file as of 06/06/2016.     Review of Systems  Constitutional: Positive for fatigue. Negative for appetite change and unexpected weight change.  HENT: Positive  for congestion and postnasal drip. Negative for sinus pressure.   Respiratory: Positive for cough and wheezing. Negative for chest tightness and shortness of breath.   Cardiovascular: Negative for chest pain and leg swelling.  Gastrointestinal: Negative for abdominal pain, diarrhea, nausea and vomiting.  Skin: Negative for color change and rash.  Neurological: Negative for dizziness, light-headedness and headaches.       Objective:    Physical Exam  Constitutional: She appears well-developed and well-nourished. No distress.  HENT:  Nose: Nose normal.  Mouth/Throat: Oropharynx is clear and moist.  Neck: Neck supple.  Cardiovascular: Normal rate and regular rhythm.   Pulmonary/Chest: Breath sounds normal. No respiratory distress.  Increased cough with expiration.    Lymphadenopathy:    She has no cervical adenopathy.    BP 140/78 (BP Location: Left Arm, Patient Position: Sitting, Cuff Size: Large)   Pulse 100   Temp 99 F (37.2 C) (Oral)   Wt 202 lb (91.6 kg)   LMP 05/14/2016   SpO2 98%   BMI 35.78 kg/m  Wt Readings from Last 3 Encounters:  06/06/16 202 lb (91.6 kg)  05/30/16 207 lb (93.9 kg)  05/28/16 206 lb 3.2 oz (93.5 kg)     Lab Results  Component Value Date   WBC 9.5 04/05/2016   HGB 13.8 04/05/2016   HCT 41.7 04/05/2016   PLT 348.0 04/05/2016   GLUCOSE 123 (H) 02/13/2016   CHOL 144 02/13/2016   TRIG 71.0 02/13/2016   HDL 52.00 02/13/2016   LDLCALC 78 02/13/2016   ALT 20 02/13/2016   AST 17 02/13/2016   NA 137 02/13/2016   K 3.9 02/13/2016   CL 103 02/13/2016   CREATININE 0.92 02/13/2016   BUN 10 02/13/2016   CO2 26 02/13/2016   TSH 1.36 02/13/2016   HGBA1C 6.9 (H) 02/13/2016   MICROALBUR 2.8 (H) 02/13/2016        Assessment & Plan:   Problem List Items Addressed This Visit    Respiratory tract infection    Persistent increased cough and congestion as outlined.  Already treated with omnicef.  Continue nasal sprays and inhalers as outlined.   Continue mucinex.  Start prednisone taper as directed.  Will need to monitor sugars while on prednisone.  Follow.         Other Visit Diagnoses    Acute bronchitis, unspecified organism       Relevant Medications   chlorpheniramine-HYDROcodone (TUSSIONEX PENNKINETIC ER) 10-8 MG/5ML Georgianne Fick, MD

## 2016-06-12 ENCOUNTER — Encounter: Payer: Self-pay | Admitting: Internal Medicine

## 2016-06-12 NOTE — Assessment & Plan Note (Signed)
Persistent increased cough and congestion as outlined.  Already treated with omnicef.  Continue nasal sprays and inhalers as outlined.  Continue mucinex.  Start prednisone taper as directed.  Will need to monitor sugars while on prednisone.  Follow.

## 2016-06-17 ENCOUNTER — Encounter: Payer: Self-pay | Admitting: Internal Medicine

## 2016-06-18 MED ORDER — FLUCONAZOLE 150 MG PO TABS: 150.0000 mg | ORAL_TABLET | Freq: Once | ORAL | 0 refills | Status: AC

## 2016-06-18 MED ORDER — NYSTATIN 100000 UNIT/ML MT SUSP: 5.0000 mL | Freq: Three times a day (TID) | OROMUCOSAL | 0 refills | Status: DC

## 2016-06-18 NOTE — Telephone Encounter (Signed)
Spoke to pt.  Will call in nystatin suspension and diflucan as directed.  Will call if persistent problems.  Will keep f/u appt on Wednesday.

## 2016-06-20 ENCOUNTER — Ambulatory Visit (INDEPENDENT_AMBULATORY_CARE_PROVIDER_SITE_OTHER): Payer: BLUE CROSS/BLUE SHIELD | Admitting: Internal Medicine

## 2016-06-20 ENCOUNTER — Encounter: Payer: Self-pay | Admitting: Internal Medicine

## 2016-06-20 DIAGNOSIS — B3731 Acute candidiasis of vulva and vagina: Secondary | ICD-10-CM

## 2016-06-20 DIAGNOSIS — I10 Essential (primary) hypertension: Secondary | ICD-10-CM

## 2016-06-20 DIAGNOSIS — E119 Type 2 diabetes mellitus without complications: Secondary | ICD-10-CM | POA: Diagnosis not present

## 2016-06-20 DIAGNOSIS — E611 Iron deficiency: Secondary | ICD-10-CM

## 2016-06-20 DIAGNOSIS — J988 Other specified respiratory disorders: Secondary | ICD-10-CM

## 2016-06-20 DIAGNOSIS — B373 Candidiasis of vulva and vagina: Secondary | ICD-10-CM

## 2016-06-20 LAB — CBC WITH DIFFERENTIAL/PLATELET
Basophils Absolute: 0.1 10*3/uL (ref 0.0–0.1)
Basophils Relative: 0.7 % (ref 0.0–3.0)
Eosinophils Absolute: 0.2 10*3/uL (ref 0.0–0.7)
Eosinophils Relative: 2.1 % (ref 0.0–5.0)
HCT: 41.3 % (ref 36.0–46.0)
Hemoglobin: 13.4 g/dL (ref 12.0–15.0)
Lymphocytes Relative: 25.7 % (ref 12.0–46.0)
Lymphs Abs: 2.8 10*3/uL (ref 0.7–4.0)
MCHC: 32.5 g/dL (ref 30.0–36.0)
MCV: 78.5 fl (ref 78.0–100.0)
Monocytes Absolute: 0.7 10*3/uL (ref 0.1–1.0)
Monocytes Relative: 6.4 % (ref 3.0–12.0)
Neutro Abs: 7.1 10*3/uL (ref 1.4–7.7)
Neutrophils Relative %: 65.1 % (ref 43.0–77.0)
Platelets: 343 10*3/uL (ref 150.0–400.0)
RBC: 5.27 Mil/uL — ABNORMAL HIGH (ref 3.87–5.11)
RDW: 17.3 % — ABNORMAL HIGH (ref 11.5–15.5)
WBC: 11 10*3/uL — ABNORMAL HIGH (ref 4.0–10.5)

## 2016-06-20 LAB — COMPREHENSIVE METABOLIC PANEL
ALT: 30 U/L (ref 0–35)
AST: 14 U/L (ref 0–37)
Albumin: 3.9 g/dL (ref 3.5–5.2)
Alkaline Phosphatase: 96 U/L (ref 39–117)
BUN: 11 mg/dL (ref 6–23)
CO2: 27 mEq/L (ref 19–32)
Calcium: 9.3 mg/dL (ref 8.4–10.5)
Chloride: 105 mEq/L (ref 96–112)
Creatinine, Ser: 0.91 mg/dL (ref 0.40–1.20)
GFR: 70.07 mL/min (ref >60.00)
Glucose, Bld: 170 mg/dL — ABNORMAL HIGH (ref 70–99)
Potassium: 4.2 mEq/L (ref 3.5–5.1)
Sodium: 139 mEq/L (ref 135–145)
Total Bilirubin: 0.6 mg/dL (ref 0.2–1.2)
Total Protein: 6.5 g/dL (ref 6.0–8.3)

## 2016-06-20 LAB — FERRITIN: Ferritin: 6.1 ng/mL — ABNORMAL LOW (ref 10.0–291.0)

## 2016-06-20 LAB — HEMOGLOBIN A1C: Hgb A1c MFr Bld: 7 % — ABNORMAL HIGH (ref 4.6–6.5)

## 2016-06-20 NOTE — Progress Notes (Signed)
Patient ID: Donna Hensley, female   DOB: 01/13/1966, 51 y.o.   MRN: [id number]   Subjective:    Patient ID: Donna Hensley, female    DOB: 1965-12-16, 51 y.o.   MRN: [id number]  HPI  Patient here for a scheduled follow up.  Recently treated for respiratory flare.  Completed prednisone.  Breathing better.  Having issues with thrush and vaginal yeast infection.  Started on diflucan.  Symptoms have improved.  Breathing is better.  Still some minimal cough.  Sleeping.  Working.  Eating and drinking.  No nausea or vomiting.  Bowels moving.  States am sugars on prednisone 160s.  Had been averaging 130-140.     Past Medical History:  Diagnosis Date  . Allergy   . Asthma   . GERD (gastroesophageal reflux disease)   . Gestational diabetes    Past Surgical History:  Procedure Laterality Date  . APPENDECTOMY  1986  . [place]  . CHOLECYSTECTOMY  2013  . TONSILLECTOMY  1987   Family History  Problem Relation Age of Onset  . Hyperlipidemia Mother   . Osteoporosis Mother   . Hypertension Father   . Arthritis Father   . Sleep apnea Father   . Heart disease Father   . Sleep apnea Brother   . Arthritis Paternal Grandmother   . Diabetes Paternal Grandmother   . Breast cancer Neg Hx    Social History   Social History  . Marital status: Married    Spouse name: N/A  . Number of children: 2  . Years of education: N/A   Occupational History  . 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 Smoking status: Former Smoker    Packs/day: 0.25    Years: 5.00    Types: Cigarettes    Quit date: 09/28/1988  . Smokeless tobacco: Never Used     Comment: smoked briefly as a teenager  . Alcohol use 0.6 oz/week    1 Glasses of wine per week     Comment: Occasional 1-2 per month  . Drug use: No  . Sexual activity: Not Asked   Other Topics Concern  . None   Social History Narrative   Regular exercise-yes, walk   Diet: fast food, trying to improve  diet          Outpatient Encounter Prescriptions as of 06/20/2016  Medication Sig  . albuterol (PROVENTIL HFA;VENTOLIN HFA) 108 (90 Base) MCG/ACT inhaler Inhale 2 puffs into the lungs every 6 (six) hours as needed for wheezing.  [name] BREO ELLIPTA 200-25 MCG/INH AEPB Inhale 1 puff into the lungs daily.   . Calcium-Magnesium-Vitamin D (CALCIUM 500 PO) Take 1 tablet by mouth daily.  . Cholecalciferol (VITAMIN D3) 1000 units CAPS Take 1 capsule by mouth daily.  . Cyanocobalamin (VITAMIN B 12 PO) Take 1 tablet by mouth daily.  . Fe Fum-FePoly-Vit C-Vit B3 (INTEGRA) 62.5-62.5-40-3 MG CAPS Take 1 capsule by mouth daily.  . fluticasone (FLONASE) 50 MCG/ACT nasal spray USE 2 SPRAYS EACH NOSTRIL DAILY (Patient taking differently: Place 2 sprays into both nostrils daily as needed. USE 2 SPRAYS EACH NOSTRIL DAILY)  . levocetirizine (XYZAL) 5 MG tablet Take 5 mg by mouth every evening.  [name] losartan (COZAAR) 25 MG tablet TAKE 1 TABLET DAILY  . metFORMIN (GLUCOPHAGE) 500 MG tablet TAKE 1 TABLET DAILY  . montelukast (SINGULAIR) 10 MG tablet TAKE 1 TABLET DAILY AT BEDTIME  . Multiple Vitamin (MULTIVITAMIN) tablet Take 1  tablet by mouth daily.  [name] nystatin (MYCOSTATIN) 100000 UNIT/ML suspension Take 5 mLs (500,000 Units total) by mouth 3 (three) times daily. Swish and spit  . omeprazole (PRILOSEC) 40 MG capsule TAKE 1 CAPSULE DAILY  . [DISCONTINUED] cefdinir (OMNICEF) 300 MG capsule Take 1 capsule (300 mg total) by mouth 2 (two) times daily. (Patient not taking: Reported on 06/20/2016)  . [DISCONTINUED] chlorpheniramine-HYDROcodone (TUSSIONEX PENNKINETIC ER) 10-8 MG/5ML SUER Take 5 mLs by mouth every 12 (twelve) hours as needed.  . [DISCONTINUED] predniSONE (DELTASONE) 10 MG tablet Take 6 tablets x 1 day and then decrease by 1/2 tablet per day until down to zero mg.   No facility-administered encounter medications on file as of 06/20/2016.     Review of Systems  Constitutional: Negative for appetite change and  unexpected weight change.  HENT: Negative for congestion and sinus pressure.   Respiratory: Negative for chest tightness and shortness of breath.        Cough is better.   Cardiovascular: Negative for chest pain, palpitations and leg swelling.  Gastrointestinal: Negative for abdominal pain, diarrhea, nausea and vomiting.  Genitourinary: Negative for difficulty urinating and dysuria.       Vaginal itching and symptoms better.   Musculoskeletal: Negative for back pain and joint swelling.  Skin: Negative for color change and rash.  Neurological: Negative for dizziness, light-headedness and headaches.  Psychiatric/Behavioral: Negative for agitation and dysphoric mood.       Objective:     Blood pressure rechecked by me:  122/80  Physical Exam  Constitutional: She appears well-developed and well-nourished. No distress.  HENT:  Nose: Nose normal.  Mouth/Throat: Oropharynx is clear and moist.  Neck: Neck supple. No thyromegaly present.  Cardiovascular: Normal rate and regular rhythm.   Pulmonary/Chest: Breath sounds normal. No respiratory distress. She has no wheezes.  Abdominal: Soft. Bowel sounds are normal. There is no tenderness.  Musculoskeletal: She exhibits no edema or tenderness.  Lymphadenopathy:    She has no cervical adenopathy.  Skin: No rash noted. No erythema.  Psychiatric: She has a normal mood and affect. Her behavior is normal.    BP 122/78 (BP Location: Left Arm, Patient Position: Sitting, Cuff Size: Large)   Pulse 91   Temp 98.6 F (37 C) (Oral)   Resp 16   Ht 5' 3" (1.6 m)   Wt 202 lb 12.8 oz (92 kg)   LMP 06/11/2016   SpO2 98%   BMI 35.92 kg/m  Wt Readings from Last 3 Encounters:  06/20/16 202 lb 12.8 oz (92 kg)  06/06/16 202 lb (91.6 kg)  05/30/16 207 lb (93.9 kg)     Lab Results  Component Value Date   WBC 9.5 04/05/2016   HGB 13.8 04/05/2016   HCT 41.7 04/05/2016   PLT 348.0 04/05/2016   GLUCOSE 123 (H) 02/13/2016   CHOL 144 02/13/2016    TRIG 71.0 02/13/2016   HDL 52.00 02/13/2016   LDLCALC 78 02/13/2016   ALT 20 02/13/2016   AST 17 02/13/2016   NA 137 02/13/2016   K 3.9 02/13/2016   CL 103 02/13/2016   CREATININE 0.92 02/13/2016   BUN 10 02/13/2016   CO2 26 02/13/2016   TSH 1.36 02/13/2016   HGBA1C 6.9 (H) 02/13/2016   MICROALBUR 2.8 (H) 02/13/2016    Mm Digital Screening Bilateral  Result Date: 03/20/2016 CLINICAL DATA:  Screening. EXAM: DIGITAL SCREENING BILATERAL MAMMOGRAM WITH CAD COMPARISON:  Previous exam(s). ACR Breast Density Category b: There are scattered areas of fibroglandular  density. FINDINGS: There are no findings suspicious for malignancy. Images were processed with CAD. IMPRESSION: No mammographic evidence of malignancy. A result letter of this screening mammogram will be mailed directly to the patient. RECOMMENDATION: Screening mammogram in one year. (Code:SM-B-01Y) BI-RADS CATEGORY  1: Negative. Electronically Signed   By: Fidela Salisbury M.D.   On: 03/20/2016 13:52       Assessment & Plan:   Problem List Items Addressed This Visit    Diabetes ([place])    Sugars elevated on prednisone.  Discussed diet and exercise.  Check met b and a1c.        Relevant Orders   Comprehensive metabolic panel   Hemoglobin A1c   Essential hypertension, benign    Blood pressure under good control.  Continue same medication regimen.  Follow pressures.  Follow metabolic panel.        Iron deficiency    On iron supplements.  Recheck cbc and ferritin.        Relevant Orders   CBC with Differential/Platelet   Ferritin   Respiratory tract infection    Cough and congestion better.  Continue inhalers.  Continue singulair.  Follow.        Vaginal yeast infection    Taking diflucan.  Symptoms improved.  Follow.            Einar Pheasant, MD

## 2016-06-20 NOTE — Assessment & Plan Note (Signed)
Sugars elevated on prednisone.  Discussed diet and exercise.  Check met b and a1c.

## 2016-06-20 NOTE — Assessment & Plan Note (Signed)
Taking diflucan.  Symptoms improved.  Follow.

## 2016-06-20 NOTE — Assessment & Plan Note (Signed)
On iron supplements.  Recheck cbc and ferritin.   

## 2016-06-20 NOTE — Progress Notes (Signed)
Pre-visit discussion using our clinic review tool. No additional management support is needed unless otherwise documented below in the visit note.  

## 2016-06-20 NOTE — Assessment & Plan Note (Signed)
Cough and congestion better.  Continue inhalers.  Continue singulair.  Follow.

## 2016-06-20 NOTE — Assessment & Plan Note (Signed)
Blood pressure under good control.  Continue same medication regimen.  Follow pressures.  Follow metabolic panel.   

## 2016-06-21 ENCOUNTER — Encounter: Payer: Self-pay | Admitting: Internal Medicine

## 2016-06-23 ENCOUNTER — Other Ambulatory Visit: Payer: Self-pay | Admitting: Internal Medicine

## 2016-07-05 ENCOUNTER — Other Ambulatory Visit: Payer: Self-pay | Admitting: Internal Medicine

## 2016-09-07 ENCOUNTER — Other Ambulatory Visit: Payer: Self-pay | Admitting: Internal Medicine

## 2016-10-18 ENCOUNTER — Other Ambulatory Visit: Payer: Self-pay | Admitting: Internal Medicine

## 2016-10-18 ENCOUNTER — Encounter: Payer: Self-pay | Admitting: Internal Medicine

## 2016-10-19 NOTE — Telephone Encounter (Signed)
Please call pt and inform her that if she is having increased pain in her foot, she needs to go ahead and have evaluated. Please let her know that I am not in the office.  Can schedule an acute visit for her or evaluation at acute care depending on urgency.  Also, I can refer her to podiatry if desires.    Dr Nicki Reaper

## 2016-10-26 ENCOUNTER — Ambulatory Visit: Payer: BLUE CROSS/BLUE SHIELD | Admitting: Internal Medicine

## 2016-12-19 ENCOUNTER — Ambulatory Visit (INDEPENDENT_AMBULATORY_CARE_PROVIDER_SITE_OTHER): Payer: BLUE CROSS/BLUE SHIELD

## 2016-12-19 ENCOUNTER — Ambulatory Visit (INDEPENDENT_AMBULATORY_CARE_PROVIDER_SITE_OTHER): Payer: BLUE CROSS/BLUE SHIELD | Admitting: Internal Medicine

## 2016-12-19 ENCOUNTER — Encounter: Payer: Self-pay | Admitting: Internal Medicine

## 2016-12-19 ENCOUNTER — Other Ambulatory Visit: Payer: Self-pay | Admitting: Internal Medicine

## 2016-12-19 VITALS — BP 120/76 | HR 77 | Temp 98.6°F | Resp 12 | Ht 63.0 in | Wt 209.0 lb

## 2016-12-19 DIAGNOSIS — E611 Iron deficiency: Secondary | ICD-10-CM | POA: Diagnosis not present

## 2016-12-19 DIAGNOSIS — E119 Type 2 diabetes mellitus without complications: Secondary | ICD-10-CM

## 2016-12-19 DIAGNOSIS — S92351A Displaced fracture of fifth metatarsal bone, right foot, initial encounter for closed fracture: Secondary | ICD-10-CM | POA: Diagnosis not present

## 2016-12-19 DIAGNOSIS — M79671 Pain in right foot: Secondary | ICD-10-CM

## 2016-12-19 DIAGNOSIS — J4520 Mild intermittent asthma, uncomplicated: Secondary | ICD-10-CM | POA: Diagnosis not present

## 2016-12-19 DIAGNOSIS — I10 Essential (primary) hypertension: Secondary | ICD-10-CM

## 2016-12-19 DIAGNOSIS — Z23 Encounter for immunization: Secondary | ICD-10-CM

## 2016-12-19 DIAGNOSIS — N926 Irregular menstruation, unspecified: Secondary | ICD-10-CM

## 2016-12-19 DIAGNOSIS — K219 Gastro-esophageal reflux disease without esophagitis: Secondary | ICD-10-CM

## 2016-12-19 LAB — CBC WITH DIFFERENTIAL/PLATELET
Basophils Absolute: 0.1 10*3/uL (ref 0.0–0.1)
Basophils Relative: 1.1 % (ref 0.0–3.0)
Eosinophils Absolute: 0.2 10*3/uL (ref 0.0–0.7)
Eosinophils Relative: 2.8 % (ref 0.0–5.0)
HCT: 41.4 % (ref 36.0–46.0)
Hemoglobin: 13.3 g/dL (ref 12.0–15.0)
Lymphocytes Relative: 36.4 % (ref 12.0–46.0)
Lymphs Abs: 3 10*3/uL (ref 0.7–4.0)
MCHC: 32.1 g/dL (ref 30.0–36.0)
MCV: 77.6 fl — ABNORMAL LOW (ref 78.0–100.0)
Monocytes Absolute: 0.6 10*3/uL (ref 0.1–1.0)
Monocytes Relative: 6.9 % (ref 3.0–12.0)
Neutro Abs: 4.4 10*3/uL (ref 1.4–7.7)
Neutrophils Relative %: 52.8 % (ref 43.0–77.0)
Platelets: 322 10*3/uL (ref 150.0–400.0)
RBC: 5.34 Mil/uL — ABNORMAL HIGH (ref 3.87–5.11)
RDW: 18.1 % — ABNORMAL HIGH (ref 11.5–15.5)
WBC: 8.3 10*3/uL (ref 4.0–10.5)

## 2016-12-19 LAB — LIPID PANEL
Cholesterol: 152 mg/dL (ref 0–200)
HDL: 50.8 mg/dL (ref >39.00)
LDL Cholesterol: 90 mg/dL (ref 0–99)
NonHDL: 101.54
Total CHOL/HDL Ratio: 3
Triglycerides: 60 mg/dL (ref 0.0–149.0)
VLDL: 12 mg/dL (ref 0.0–40.0)

## 2016-12-19 LAB — HEPATIC FUNCTION PANEL
ALT: 34 U/L (ref 0–35)
AST: 29 U/L (ref 0–37)
Albumin: 4.3 g/dL (ref 3.5–5.2)
Alkaline Phosphatase: 98 U/L (ref 39–117)
Bilirubin, Direct: 0.2 mg/dL (ref 0.0–0.3)
Total Bilirubin: 0.7 mg/dL (ref 0.2–1.2)
Total Protein: 7.8 g/dL (ref 6.0–8.3)

## 2016-12-19 LAB — BASIC METABOLIC PANEL
BUN: 14 mg/dL (ref 6–23)
CO2: 28 mEq/L (ref 19–32)
Calcium: 9.8 mg/dL (ref 8.4–10.5)
Chloride: 104 mEq/L (ref 96–112)
Creatinine, Ser: 0.92 mg/dL (ref 0.40–1.20)
GFR: 69.04 mL/min (ref >60.00)
Glucose, Bld: 108 mg/dL — ABNORMAL HIGH (ref 70–99)
Potassium: 4.4 mEq/L (ref 3.5–5.1)
Sodium: 138 mEq/L (ref 135–145)

## 2016-12-19 LAB — HEMOGLOBIN A1C: Hgb A1c MFr Bld: 6.7 % — ABNORMAL HIGH (ref 4.6–6.5)

## 2016-12-19 LAB — FERRITIN: Ferritin: 6.6 ng/mL — ABNORMAL LOW (ref 10.0–291.0)

## 2016-12-19 MED ORDER — LOSARTAN POTASSIUM 25 MG PO TABS: 25.0000 mg | ORAL_TABLET | Freq: Every day | ORAL | 1 refills | Status: DC

## 2016-12-19 MED ORDER — METFORMIN HCL 500 MG PO TABS: 500.0000 mg | ORAL_TABLET | Freq: Every day | ORAL | 1 refills | Status: DC

## 2016-12-19 MED ORDER — GLUCOSE BLOOD VI STRP: ORAL_STRIP | 3 refills | Status: AC

## 2016-12-19 NOTE — Progress Notes (Signed)
Pre-visit discussion using our clinic review tool. No additional management support is needed unless otherwise documented below in the visit note.  

## 2016-12-19 NOTE — Progress Notes (Signed)
Order placed for podiatry referral.   

## 2016-12-19 NOTE — Progress Notes (Signed)
Patient ID: Donna Hensley, female   DOB: Sep 21, 1965, 51 y.o.   MRN: [id number]   Subjective:    Patient ID: Donna Hensley, female    DOB: [birth year], 51 y.o.   MRN: [id number]  HPI  Patient here for a scheduled follow up.  She reports she has been under increased stress recently.  Son is at college.  Daughter recently married and moved to [place].  Her grandmother recently passed away.  Overall she feels she is handling things relatively well.  She has not been watching her diet as well.  Plans to get more serious about her diet and exercise.  She is going to the gym.  She does report increased pain -- right lateral foot.  Noticed pain in 09/2016.  Was walking at her son's college.  Denies any injury or trauma.  Persistent pain.  Is walking.  No significant swelling in her foot, ankle or leg.  No chest pain.  Breathing stable.  No acid reflux.  No abdominal pain.  Bowels moving.  No urine change.  Had menstrual period 06/2016, 08/2016 and two weeks ago.  No hot flashes.  Husband has had a vasectomy.  Denies pregnancy.     Past Medical History:  Diagnosis Date  . Allergy   . Asthma   . GERD (gastroesophageal reflux disease)   . Gestational diabetes    Past Surgical History:  Procedure Laterality Date  . APPENDECTOMY  1986  . [place]  . CHOLECYSTECTOMY  2013  . TONSILLECTOMY  1987   Family History  Problem Relation Age of Onset  . Hyperlipidemia Mother   . Osteoporosis Mother   . Hypertension Father   . Arthritis Father   . Sleep apnea Father   . Heart disease Father   . Sleep apnea Brother   . Arthritis Paternal Grandmother   . Diabetes Paternal Grandmother   . Breast cancer Neg Hx    Social History   Social History  . Marital status: Married    Spouse name: N/A  . Number of children: 2  . Years of education: N/A   Occupational History  . 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 Smoking status: Former Smoker    Packs/day:  0.25    Years: 5.00    Types: Cigarettes    Quit date: 09/28/1988  . Smokeless tobacco: Never Used     Comment: smoked briefly as a teenager  . Alcohol use 0.6 oz/week    1 Glasses of wine per week     Comment: Occasional 1-2 per month  . Drug use: No  . Sexual activity: Not Asked   Other Topics Concern  . None   Social History Narrative   Regular exercise-yes, walk   Diet: fast food, trying to improve diet          Outpatient Encounter Prescriptions as of 12/19/2016  Medication Sig  . albuterol (PROVENTIL HFA;VENTOLIN HFA) 108 (90 Base) MCG/ACT inhaler Inhale 2 puffs into the lungs every 6 (six) hours as needed for wheezing.  [name] BREO ELLIPTA 200-25 MCG/INH AEPB Inhale 1 puff into the lungs daily.   . Calcium-Magnesium-Vitamin D (CALCIUM 500 PO) Take 1 tablet by mouth daily.  . Cholecalciferol (VITAMIN D3) 1000 units CAPS Take 1 capsule by mouth daily.  . Cyanocobalamin (VITAMIN B 12 PO) Take 1 tablet by mouth daily.  . fluticasone (FLONASE) 50 MCG/ACT nasal spray USE 2 SPRAYS IN EACH NOSTRIL DAILY  .  levocetirizine (XYZAL) 5 MG tablet Take 5 mg by mouth every evening.  [name] losartan (COZAAR) 25 MG tablet Take 1 tablet (25 mg total) by mouth daily.  . metFORMIN (GLUCOPHAGE) 500 MG tablet Take 1 tablet (500 mg total) by mouth daily.  . montelukast (SINGULAIR) 10 MG tablet TAKE 1 TABLET DAILY AT BEDTIME  . Multiple Vitamin (MULTIVITAMIN) tablet Take 1 tablet by mouth daily.  [name] omeprazole (PRILOSEC) 40 MG capsule TAKE 1 CAPSULE DAILY  . [DISCONTINUED] Fe Fum-FePoly-Vit C-Vit B3 (INTEGRA) 62.5-62.5-40-3 MG CAPS Take 1 capsule by mouth daily.  . [DISCONTINUED] losartan (COZAAR) 25 MG tablet TAKE 1 TABLET DAILY  . [DISCONTINUED] metFORMIN (GLUCOPHAGE) 500 MG tablet TAKE 1 TABLET DAILY  . [DISCONTINUED] nystatin (MYCOSTATIN) 100000 UNIT/ML suspension Take 5 mLs (500,000 Units total) by mouth 3 (three) times daily. Swish and spit  . glucose blood test strip Use as instructed   No  facility-administered encounter medications on file as of 12/19/2016.     Review of Systems  Constitutional: Negative for appetite change and unexpected weight change.  HENT: Negative for congestion and sinus pressure.   Respiratory: Negative for cough, chest tightness and shortness of breath.   Cardiovascular: Negative for chest pain, palpitations and leg swelling.  Gastrointestinal: Negative for abdominal pain, diarrhea, nausea and vomiting.  Genitourinary: Negative for difficulty urinating and dysuria.       Menstrual change as outlined.    Musculoskeletal: Negative for back pain and joint swelling.       Right foot pain as outlined.    Skin: Negative for color change and rash.  Neurological: Negative for dizziness and headaches.  Psychiatric/Behavioral: Negative for agitation and dysphoric mood.       Objective:    Physical Exam  Constitutional: She appears well-developed and well-nourished. No distress.  HENT:  Nose: Nose normal.  Mouth/Throat: Oropharynx is clear and moist.  Neck: Neck supple. No thyromegaly present.  Cardiovascular: Normal rate and regular rhythm.   Pulmonary/Chest: Breath sounds normal. No respiratory distress. She has no wheezes.  Abdominal: Soft. Bowel sounds are normal. There is no tenderness.  Musculoskeletal: She exhibits no edema or tenderness.  Increased pain - right fifth metatarsal.  No increased erythema or warmth.    Lymphadenopathy:    She has no cervical adenopathy.  Skin: No rash noted. No erythema.  Psychiatric: She has a normal mood and affect. Her behavior is normal.    BP 120/76 (BP Location: Left Arm, Patient Position: Sitting, Cuff Size: Normal)   Pulse 77   Temp 98.6 F (37 C) (Oral)   Resp 12   Ht 5' 3"  (1.6 m)   Wt 209 lb (94.8 kg)   LMP 12/03/2016   SpO2 99%   BMI 37.02 kg/m  Wt Readings from Last 3 Encounters:  12/19/16 209 lb (94.8 kg)  06/20/16 202 lb 12.8 oz (92 kg)  06/06/16 202 lb (91.6 kg)     Lab Results    Component Value Date   WBC 8.3 12/19/2016   HGB 13.3 12/19/2016   HCT 41.4 12/19/2016   PLT 322.0 12/19/2016   GLUCOSE 108 (H) 12/19/2016   CHOL 152 12/19/2016   TRIG 60.0 12/19/2016   HDL 50.80 12/19/2016   LDLCALC 90 12/19/2016   ALT 34 12/19/2016   AST 29 12/19/2016   NA 138 12/19/2016   K 4.4 12/19/2016   CL 104 12/19/2016   CREATININE 0.92 12/19/2016   BUN 14 12/19/2016   CO2 28 12/19/2016   TSH 1.36  02/13/2016   HGBA1C 6.7 (H) 12/19/2016   MICROALBUR 2.8 (H) 02/13/2016    Mm Digital Screening Bilateral  Result Date: 03/20/2016 CLINICAL DATA:  Screening. EXAM: DIGITAL SCREENING BILATERAL MAMMOGRAM WITH CAD COMPARISON:  Previous exam(s). ACR Breast Density Category b: There are scattered areas of fibroglandular density. FINDINGS: There are no findings suspicious for malignancy. Images were processed with CAD. IMPRESSION: No mammographic evidence of malignancy. A result letter of this screening mammogram will be mailed directly to the patient. RECOMMENDATION: Screening mammogram in one year. (Code:SM-B-01Y) BI-RADS CATEGORY  1: Negative. Electronically Signed   By: Fidela Salisbury M.D.   On: 03/20/2016 13:52       Assessment & Plan:   Problem List Items Addressed This Visit    Diabetes ([place])    Low carb diet and exercise.  She plans to get more serious about her diet and exercise.  Going to the gym.  Follow met b and a1c.  Discussed the need for regular eye exams.  She missed last year.  Plans to call to schedule.        Relevant Medications   losartan (COZAAR) 25 MG tablet   metFORMIN (GLUCOPHAGE) 500 MG tablet   Other Relevant Orders   Hemoglobin A1c (Completed)   Hepatic function panel (Completed)   Lipid panel (Completed)   Essential hypertension    Blood pressure under good control.  Continue same medication regimen.  Follow pressures.  Follow metabolic panel.        Relevant Medications   losartan (COZAAR) 25 MG tablet   Other Relevant Orders   Basic  metabolic panel (Completed)   GERD    Controlled on omeprazole.        Iron deficiency    Recheck cbc and ferritin.        Relevant Orders   CBC with Differential/Platelet (Completed)   Ferritin (Completed)   Mild intermittent asthma in adult without complication    Has seen an allergist.  On breo.  Breathing stable.  Follow.        Other Visit Diagnoses    Right foot pain    -  Primary   persistent pain.  check xray.     Relevant Orders   DG Foot 2 Views Right (Completed)   Menstrual changes       periods as outlined.  keep menstrual diary.  follow.        Addendum:  Right foot xray - displaced fracture right fifth metatarsal.  Refer to podiatry.    Einar Pheasant, MD

## 2016-12-20 ENCOUNTER — Encounter: Payer: Self-pay | Admitting: Internal Medicine

## 2016-12-20 DIAGNOSIS — M79671 Pain in right foot: Secondary | ICD-10-CM | POA: Diagnosis not present

## 2016-12-20 DIAGNOSIS — Z23 Encounter for immunization: Secondary | ICD-10-CM | POA: Diagnosis not present

## 2016-12-20 NOTE — Assessment & Plan Note (Addendum)
Low carb diet and exercise.  She plans to get more serious about her diet and exercise.  Going to the gym.  Follow met b and a1c.  Discussed the need for regular eye exams.  She missed last year.  Plans to call to schedule.

## 2016-12-20 NOTE — Assessment & Plan Note (Signed)
Blood pressure under good control.  Continue same medication regimen.  Follow pressures.  Follow metabolic panel.   

## 2016-12-20 NOTE — Assessment & Plan Note (Signed)
Recheck cbc and ferritin.   

## 2016-12-20 NOTE — Assessment & Plan Note (Signed)
Has seen an allergist.  On breo.  Breathing stable.  Follow.

## 2016-12-20 NOTE — Assessment & Plan Note (Signed)
Controlled on omeprazole.   

## 2017-01-03 ENCOUNTER — Other Ambulatory Visit: Payer: Self-pay | Admitting: Podiatry

## 2017-01-03 ENCOUNTER — Ambulatory Visit: Payer: Self-pay | Admitting: Podiatry

## 2017-01-03 ENCOUNTER — Ambulatory Visit (INDEPENDENT_AMBULATORY_CARE_PROVIDER_SITE_OTHER): Payer: BLUE CROSS/BLUE SHIELD

## 2017-01-03 ENCOUNTER — Ambulatory Visit (INDEPENDENT_AMBULATORY_CARE_PROVIDER_SITE_OTHER): Payer: BLUE CROSS/BLUE SHIELD | Admitting: Podiatry

## 2017-01-03 ENCOUNTER — Encounter: Payer: Self-pay | Admitting: Podiatry

## 2017-01-03 DIAGNOSIS — S92351A Displaced fracture of fifth metatarsal bone, right foot, initial encounter for closed fracture: Secondary | ICD-10-CM

## 2017-01-03 DIAGNOSIS — M7671 Peroneal tendinitis, right leg: Secondary | ICD-10-CM

## 2017-01-03 DIAGNOSIS — R52 Pain, unspecified: Secondary | ICD-10-CM | POA: Diagnosis not present

## 2017-01-03 NOTE — Progress Notes (Signed)
   Subjective:    Patient ID: Donna Hensley, female    DOB: 09/17/1965, 51 y.o.   MRN: [id number]  HPI this patient presents the office for an evaluation of her right foot.  She says she has been experiencing pain in the outside of her right foot for months.  She says she started having pain and discomfort while she was walking around looking at colleges. She also says that she exercises frequently.   She says she was seen by her medical doctor on August 22 and mentioned  that she had pain in her right foot.  She said the x-rays revealed a fracture at the base of the fifth metatarsal of the right foot.  She provided no treatment aside from the x-ray.  This patient presents the office stating that she still has localized pain and points to the outside of her right foot.   She says this is been painful and swollen and throbbing . She presents the office today per referral from her medical doctor for an evaluation of the previously diagnosed closed fracture fifth metatarsal right foot.  This patient is diabetic on metformin.    Review of Systems  HENT:       Sinus problems       Objective:   Physical Exam GENERAL APPEARANCE: Alert, conversant. Appropriately groomed. No acute distress.  VASCULAR: Pedal pulses are  palpable at  [facility] and PT bilateral.  Capillary refill time is immediate to all digits,  Normal temperature gradient.  Digital hair growth is present bilateral  NEUROLOGIC: sensation is normal to 5.07 monofilament at 5/5 sites bilateral.  Light touch is intact bilateral, Muscle strength normal.  MUSCULOSKELETAL: acceptable muscle strength, tone and stability bilateral.  Intrinsic muscluature intact bilateral.  Rectus appearance of foot and digits noted bilateral. Pain and swelling noted at the insertion of the peroneal tendon at the base of the fifth metatarsal right foot.  No pain noted along the course of the peroneal tendon right foot  DERMATOLOGIC: skin color, texture, and turgor are  within normal limits.  No preulcerative lesions or ulcers  are seen, no interdigital maceration noted.  No open lesions present.  Digital nails are asymptomatic. No drainage noted.         Assessment & Plan:  Insertional peroneal tendinitis right foot.   Possible closed fracture right fifth metabase.   IE  x-rays were reviewed from the 8/22 visit.  No x-rays were taken to see if there are any changes in the last 2 weeks.  No changes were noted that there was a cloudy area at the base of the fifth metatarsal.  No evidence of fracture or bony pathology noted.  Clinically this patient is still exercising and is active.  I do not believe she does have a fracture at the base of the fifth.  There might be a avulsion fracture at the base of the fifth metatarsal right foot.  I diagnosed her as having an insertional peroneal tendon inflammation and discussed the condition with this patient  . We decided to treat her with Mobic  and told her to decrease her activity level to allow this area to heal.  Prescribed Mobic.  RTC 3 weeks for reevaluation.     Gardiner Barefoot DPM  .

## 2017-01-04 ENCOUNTER — Telehealth: Payer: Self-pay | Admitting: Podiatry

## 2017-01-04 NOTE — Telephone Encounter (Signed)
I was seen yesterday and the doctor was supposed to send an Rx to my pharmacy. They never received anything. You can call me back at [phone]. Thank you.

## 2017-01-08 MED ORDER — MELOXICAM 15 MG PO TABS: 15.0000 mg | ORAL_TABLET | Freq: Every day | ORAL | 1 refills | Status: DC

## 2017-01-08 NOTE — Addendum Note (Signed)
Addended by: Graceann Congress D on: 01/08/2017 11:29 AM   Modules accepted: Orders

## 2017-01-08 NOTE — Telephone Encounter (Signed)
Rx has been sent to pharmacy

## 2017-01-31 ENCOUNTER — Encounter: Payer: Self-pay | Admitting: Podiatry

## 2017-01-31 ENCOUNTER — Ambulatory Visit (INDEPENDENT_AMBULATORY_CARE_PROVIDER_SITE_OTHER): Payer: BLUE CROSS/BLUE SHIELD | Admitting: Podiatry

## 2017-01-31 DIAGNOSIS — S93601D Unspecified sprain of right foot, subsequent encounter: Secondary | ICD-10-CM

## 2017-01-31 DIAGNOSIS — E119 Type 2 diabetes mellitus without complications: Secondary | ICD-10-CM | POA: Diagnosis not present

## 2017-01-31 DIAGNOSIS — M7671 Peroneal tendinitis, right leg: Secondary | ICD-10-CM | POA: Diagnosis not present

## 2017-01-31 NOTE — Progress Notes (Signed)
   Subjective:    Patient ID: Donna Hensley, female    DOB: 1966-04-04, 51 y.o.   MRN: [id number]  HPI this patient presents the office for an evaluation of her right foot.  She says she has still  been experiencing pain in the outside of her right foot for months..  This patient presents the office stating that she still has localized pain and points to the outside of her right foot.   She says this is been painful and swollen and throbbing . She was treated last visit with Mobic  as well as a compression elastic sock..  She says her foot is more painful today than it was at her initial exam.   This patient is diabetic on metformin.    Review of Systems  HENT:       Sinus problems       Objective:   Physical Exam GENERAL APPEARANCE: Alert, conversant. Appropriately groomed. No acute distress.  VASCULAR: Pedal pulses are  palpable at  [facility] and PT bilateral.  Capillary refill time is immediate to all digits,  Normal temperature gradient.  Digital hair growth is present bilateral  NEUROLOGIC: sensation is normal to 5.07 monofilament at 5/5 sites bilateral.  Light touch is intact bilateral, Muscle strength normal.  MUSCULOSKELETAL: acceptable muscle strength, tone and stability bilateral.  Intrinsic muscluature intact bilateral.  Rectus appearance of foot and digits noted bilateral. Pain and swelling noted at the insertion of the peroneal tendon at the base of the fifth metatarsal right foot.  No pain noted along the course of the peroneal tendon right foot  DERMATOLOGIC: skin color, texture, and turgor are within normal limits.  No preulcerative lesions or ulcers  are seen, no interdigital maceration noted.  No open lesions present.  Digital nails are asymptomatic. No drainage noted.         Assessment & Plan:  Insertional peroneal tendinitis right foot.   Possible closed fracture right fifth metabase.    ROV.  Examination of the foot do not reveal a red, hot, base fifth metatarsal  right foot.  Clinically, she does not present with a fracture at the base of the fifth metatarsal.  We discussed her treatment and I recommended that we treat her with injection therapy since she is scheduled to go to [place] in one week.  If the pain persists  Following her [place] trip,, then we need to consider immobilization with an Unna boot in 2 weeks.  RTC 2 weeks.   Gardiner Barefoot DPM  .

## 2017-02-14 ENCOUNTER — Ambulatory Visit (INDEPENDENT_AMBULATORY_CARE_PROVIDER_SITE_OTHER): Payer: BLUE CROSS/BLUE SHIELD | Admitting: Podiatry

## 2017-02-14 DIAGNOSIS — E119 Type 2 diabetes mellitus without complications: Secondary | ICD-10-CM

## 2017-02-14 DIAGNOSIS — M7671 Peroneal tendinitis, right leg: Secondary | ICD-10-CM | POA: Diagnosis not present

## 2017-02-14 DIAGNOSIS — S93601D Unspecified sprain of right foot, subsequent encounter: Secondary | ICD-10-CM

## 2017-02-14 NOTE — Progress Notes (Signed)
   Subjective:    Patient ID: Donna Hensley, female    DOB: 1966/02/05, 51 y.o.   MRN: [id number]  Foot Pain    This patient presents the office for continued evaluation of her right foot.  She was given an injection on the last visit prior to her visit to [place].  She says that this foot still hurts and is uncomfortable and she even states it feels puffy.  She was treated previously with Mobic injection and a compression sock all have been unsuccessful in eliminating her pain.  She presents the office today for continued evaluation and treatment of her right foot    Review of Systems  HENT:       Sinus problems       Objective:   Physical Exam GENERAL APPEARANCE: Alert, conversant. Appropriately groomed. No acute distress.  VASCULAR: Pedal pulses are  palpable at  [facility] and PT bilateral.  Capillary refill time is immediate to all digits,  Normal temperature gradient.  Digital hair growth is present bilateral  NEUROLOGIC: sensation is normal to 5.07 monofilament at 5/5 sites bilateral.  Light touch is intact bilateral, Muscle strength normal.  MUSCULOSKELETAL: acceptable muscle strength, tone and stability bilateral.  Intrinsic muscluature intact bilateral.  Rectus appearance of foot and digits noted bilateral. Pain and swelling noted at the insertion of the peroneal tendon at the base of the fifth metatarsal right foot.  No pain noted along the course of the peroneal tendon right foot  DERMATOLOGIC: skin color, texture, and turgor are within normal limits.  No preulcerative lesions or ulcers  are seen, no interdigital maceration noted.  No open lesions present.  Digital nails are asymptomatic. No drainage noted.         Assessment & Plan:  Insertional peroneal tendinitis right foot.   Possible closed fracture right fifth metabase.    ROV.  Examination of the foot does Reveal ecchymosis noted at the base of the fifth metatarsal right foot.  This may be due to the previous  injection.  Discussed immobilization and explained the [employer] application versus a Cam Walker.  She opted for the Cam Walker to be worn.  Continue Mobic.  Return to clinic in 3 weeks for further evaluation and treatment.  If pain persists, we will order an MRI for an evaluation of the tendon inserting into the base of the fifth metatarsal.   Gardiner Barefoot DPM   Gardiner Barefoot DPM  .

## 2017-02-25 ENCOUNTER — Encounter: Payer: Self-pay | Admitting: Podiatry

## 2017-03-05 ENCOUNTER — Ambulatory Visit: Payer: Self-pay | Admitting: *Deleted

## 2017-03-05 ENCOUNTER — Encounter: Payer: Self-pay | Admitting: Internal Medicine

## 2017-03-05 NOTE — Telephone Encounter (Addendum)
Contacted pt regarding appointment  With Dr Nicki Reaper today; no appointments for any providers in this office today; will offer appointment at another [facility] office; pt does not want to come to [facility] but will go to [place] location; Pt states that she does not want to drive to [place], and will wait until tomorrow to be seen by an office that is closer to her home.

## 2017-03-05 NOTE — Telephone Encounter (Signed)
cough and sore throat since Friday Mar 01, 2017; expectorates thick yellow mucus; Pt requests to be seen at [facility], Dr Nicki Reaper  Reason for Disposition . Wheezing is present  Answer Assessment - Initial Assessment Questions 1. ONSET: "When did the cough begin?"      Friday Mar 01, 2017 2. SEVERITY: "How bad is the cough today?"      Same as yesterday; rated 6-7 out of 10 3. RESPIRATORY DISTRESS: "Describe your breathing."      Wheezing since yesterday 4. FEVER: "Do you have a fever?" If so, ask: "What is your temperature, how was it measured, and when did it start?"     no 5. SPUTUM: "Describe the color of your sputum" (clear, white, yellow, green)     Thick yellow 6. HEMOPTYSIS: "Are you coughing up any blood?" If so ask: "How much?" (flecks, streaks, tablespoons, etc.)     no 7. CARDIAC HISTORY: "Do you have any history of heart disease?" (e.g., heart attack, congestive heart failure)      no 8. LUNG HISTORY: "Do you have any history of lung disease?"  (e.g., pulmonary embolus, asthma, emphysema)     asthma 9. PE RISK FACTORS: "Do you have a history of blood clots?" (or: recent major surgery, recent prolonged travel, bedridden )     no 10. OTHER SYMPTOMS: "Do you have any other symptoms?" (e.g., runny nose, wheezing, chest pain)       wheezing 11. PREGNANCY: "Is there any chance you are pregnant?" "When was your last menstrual period?"       No, Aug 2018; husband has had vasectomy 12. TRAVEL: "Have you traveled out of the country in the last month?" (e.g., travel history, exposures)       no  Protocols used: [place]

## 2017-03-06 ENCOUNTER — Encounter: Payer: Self-pay | Admitting: Family Medicine

## 2017-03-06 ENCOUNTER — Ambulatory Visit: Payer: BLUE CROSS/BLUE SHIELD | Admitting: Family Medicine

## 2017-03-06 VITALS — BP 138/90 | HR 92 | Temp 98.6°F | Wt 209.8 lb

## 2017-03-06 DIAGNOSIS — B3731 Acute candidiasis of vulva and vagina: Secondary | ICD-10-CM

## 2017-03-06 DIAGNOSIS — J4521 Mild intermittent asthma with (acute) exacerbation: Secondary | ICD-10-CM

## 2017-03-06 DIAGNOSIS — B373 Candidiasis of vulva and vagina: Secondary | ICD-10-CM | POA: Diagnosis not present

## 2017-03-06 MED ORDER — FLUCONAZOLE 150 MG PO TABS: 150.0000 mg | ORAL_TABLET | Freq: Once | ORAL | 0 refills | Status: AC

## 2017-03-06 MED ORDER — AZITHROMYCIN 250 MG PO TABS: ORAL_TABLET | ORAL | 0 refills | Status: DC

## 2017-03-06 MED ORDER — HYDROCOD POLST-CPM POLST ER 10-8 MG/5ML PO SUER: 5.0000 mL | Freq: Two times a day (BID) | ORAL | 0 refills | Status: DC | PRN

## 2017-03-06 MED ORDER — PREDNISONE 20 MG PO TABS: ORAL_TABLET | ORAL | 0 refills | Status: DC

## 2017-03-06 NOTE — Progress Notes (Signed)
Subjective:    Patient ID: Donna Hensley, female    DOB: 1965-07-12, 51 y.o.   MRN: [id number]  HPI This is a 51 yo female who presents today with cough and nasal congestion x 4 days. Ears hurt, sore throat, cough, no sputum. Nasal drainage clear. No fever. Has been taking Muinex and Delsym. Has history of asthma. Has been using albuterol inhaler for cough. Uses flonase daily. Blood sugar running 120-130s. Hearing wheezing and feeling SOB.    Past Medical History:  Diagnosis Date  . Allergy   . Asthma   . GERD (gastroesophageal reflux disease)   . Gestational diabetes    Past Surgical History:  Procedure Laterality Date  . APPENDECTOMY  1986  . [place]  . CHOLECYSTECTOMY  2013  . TONSILLECTOMY  1987   Family History  Problem Relation Age of Onset  . Hyperlipidemia Mother   . Osteoporosis Mother   . Hypertension Father   . Arthritis Father   . Sleep apnea Father   . Heart disease Father   . Sleep apnea Brother   . Arthritis Paternal Grandmother   . Diabetes Paternal Grandmother   . Breast cancer Neg Hx    Social History   Tobacco Use  . Smoking status: Former Smoker    Packs/day: 0.25    Years: 5.00    Pack years: 1.25    Types: Cigarettes    Last attempt to quit: 09/28/1988    Years since quitting: 28.4  . Smokeless tobacco: Never Used  . Tobacco comment: smoked briefly as a teenager  Substance Use Topics  . Alcohol use: Yes    Alcohol/week: 0.6 oz    Types: 1 Glasses of wine per week    Comment: Occasional 1-2 per month  . Drug use: No      Review of Systems Per HPI    Objective:   Physical Exam  Constitutional: She is oriented to person, place, and time. She appears well-developed and well-nourished. She appears ill. No distress.  HENT:  Head: Normocephalic and atraumatic.  Right Ear: External ear normal.  Left Ear: External ear normal.  Nose: Nose normal.  Mouth/Throat: Oropharynx is clear and moist. No oropharyngeal  exudate.  Eyes: Conjunctivae are normal.  Neck: Normal range of motion. Neck supple.  Cardiovascular: Normal rate, regular rhythm and normal heart sounds.  Pulmonary/Chest: Effort normal. She has wheezes (tight expiratory wheezes, occasional dry cough. ).  Lymphadenopathy:    She has no cervical adenopathy.  Neurological: She is alert and oriented to person, place, and time.  Skin: Skin is warm and dry. She is not diaphoretic.  Psychiatric: She has a normal mood and affect. Her behavior is normal. Judgment and thought content normal.  Vitals reviewed.     BP 138/90 (BP Location: Right Arm, Patient Position: Sitting, Cuff Size: Large)   Pulse 92   Temp 98.6 F (37 C) (Oral)   Wt 209 lb 12 oz (95.1 kg)   LMP 12/04/2016   SpO2 95%   BMI 37.16 kg/m  Wt Readings from Last 3 Encounters:  03/06/17 209 lb 12 oz (95.1 kg)  12/19/16 209 lb (94.8 kg)  06/20/16 202 lb 12.8 oz (92 kg)       Assessment & Plan:  1. Mild intermittent asthma with acute exacerbation - Provided written and verbal information regarding diagnosis and treatment. - provided prednisone which she is to start right away, can start antibiotic if no improvement in a  couple of days - predniSONE (DELTASONE) 20 MG tablet; Take 2 tablets x 3 days, then 1 tablet x 3 days  Dispense: 9 tablet; Refill: 0 - azithromycin (ZITHROMAX) 250 MG tablet; Take 2 tabs PO x 1 dose, then 1 tab PO QD x 4 days  Dispense: 6 tablet; Refill: 0 - chlorpheniramine-HYDROcodone (TUSSIONEX PENNKINETIC ER) 10-8 MG/5ML SUER; Take 5 mLs every 12 (twelve) hours as needed by mouth for cough.  Dispense: 115 mL; Refill: 0 - RTC/ER precautions reviewed  2. Vaginal yeast infection - if needed if she requires anitbiotic - fluconazole (DIFLUCAN) 150 MG tablet; Take 1 tablet (150 mg total) once for 1 dose by mouth. Repeat if needed  Dispense: 2 tablet; Refill: 0   Clarene Reamer, FNP-BC  [facility] [place]  [facility]  03/07/2017 8:25 PM

## 2017-03-06 NOTE — Patient Instructions (Signed)
For nasal congestion you can use Afrin nasal spray for 3 days max, Sudafed, saline nasal spray (generic is fine for all). Drink enough fluids to make your urine light yellow. For fever/chill/muscle aches you can take over the counter acetaminophen or ibuprofen.  Please come back in if you are not better in 5-7 days or if you develop wheezing, shortness of breath or persistent vomiting.   

## 2017-03-07 ENCOUNTER — Telehealth: Payer: Self-pay

## 2017-03-07 ENCOUNTER — Ambulatory Visit: Payer: BLUE CROSS/BLUE SHIELD | Admitting: Podiatry

## 2017-03-07 ENCOUNTER — Encounter: Payer: Self-pay | Admitting: Family Medicine

## 2017-03-07 DIAGNOSIS — S92901S Unspecified fracture of right foot, sequela: Secondary | ICD-10-CM

## 2017-03-07 NOTE — Telephone Encounter (Signed)
-----   Message from Gardiner Barefoot, DPM sent at 03/07/2017  3:04 PM EST ----- Need MRI ASAP   gam

## 2017-03-07 NOTE — Telephone Encounter (Signed)
Per [facility] automated system, no precert required for MRI.  I called patient and left voice mail to call main scheduling to set up her own appt at her convenience.

## 2017-03-12 ENCOUNTER — Encounter: Payer: Self-pay | Admitting: Internal Medicine

## 2017-03-12 ENCOUNTER — Ambulatory Visit: Payer: BLUE CROSS/BLUE SHIELD | Admitting: Internal Medicine

## 2017-03-12 DIAGNOSIS — E119 Type 2 diabetes mellitus without complications: Secondary | ICD-10-CM

## 2017-03-12 DIAGNOSIS — J988 Other specified respiratory disorders: Secondary | ICD-10-CM | POA: Diagnosis not present

## 2017-03-12 DIAGNOSIS — I10 Essential (primary) hypertension: Secondary | ICD-10-CM | POA: Diagnosis not present

## 2017-03-12 MED ORDER — PREDNISONE 10 MG PO TABS: ORAL_TABLET | ORAL | 0 refills | Status: DC

## 2017-03-12 NOTE — Telephone Encounter (Signed)
Patient stated she is still not feeling any better and can come in today at 12:30.

## 2017-03-12 NOTE — Progress Notes (Signed)
Pre-visit discussion using our clinic review tool. No additional management support is needed unless otherwise documented below in the visit note.  

## 2017-03-12 NOTE — Progress Notes (Signed)
Patient ID: Donna Hensley, female   DOB: [birth year], 51 y.o.   MRN: [id number]   Subjective:    Patient ID: Donna Hensley, female    DOB: 04/07/1966, 51 y.o.   MRN: [id number]  HPI  Patient here as a work in with concerns regarding persistent cough and congestion.  Was evaluated 03/06/17 by Clarene Reamer.  Note reviewed.  Diagnosed with asthma exacerbation.  Placed on prednisone and zpak.  Taking her abx.  Finishing her prednisone.  Is some better, but still with increased cough.  Some increased drainage.  Affecting her sleep.  Using breo.  Back on now.  Had been off.  Using rescue inhaler 1-2x/day.  Eating.  States sugars ok.     Past Medical History:  Diagnosis Date  . Allergy   . Asthma   . GERD (gastroesophageal reflux disease)   . Gestational diabetes    Past Surgical History:  Procedure Laterality Date  . APPENDECTOMY  1986  . [place]  . CHOLECYSTECTOMY  2013  . TONSILLECTOMY  1987   Family History  Problem Relation Age of Onset  . Hyperlipidemia Mother   . Osteoporosis Mother   . Hypertension Father   . Arthritis Father   . Sleep apnea Father   . Heart disease Father   . Sleep apnea Brother   . Arthritis Paternal Grandmother   . Diabetes Paternal Grandmother   . Breast cancer Neg Hx    Social History   Socioeconomic History  . Marital status: Married    Spouse name: None  . Number of children: 2  . Years of education: None  . Highest education level: None  Social Needs  . Financial resource strain: None  . Food insecurity - worry: None  . Food insecurity - inability: None  . Transportation needs - medical: None  . Transportation needs - non-medical: None  Occupational History  . 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 Smoking status: Former Smoker    Packs/day: 0.25    Years: 5.00    Pack years: 1.25    Types: Cigarettes    Last attempt to quit: 09/28/1988    Years since quitting: 28.4  .  Smokeless tobacco: Never Used  . Tobacco comment: smoked briefly as a teenager  Substance and Sexual Activity  . Alcohol use: Yes    Alcohol/week: 0.6 oz    Types: 1 Glasses of wine per week    Comment: Occasional 1-2 per month  . Drug use: No  . Sexual activity: None  Other Topics Concern  . None  Social History Narrative   Regular exercise-yes, walk   Diet: fast food, trying to improve diet          Outpatient Encounter Medications as of 03/12/2017  Medication Sig  . albuterol (PROVENTIL HFA;VENTOLIN HFA) 108 (90 Base) MCG/ACT inhaler Inhale 2 puffs into the lungs every 6 (six) hours as needed for wheezing.  [name] BREO ELLIPTA 200-25 MCG/INH AEPB Inhale 1 puff into the lungs daily.   . Calcium-Magnesium-Vitamin D (CALCIUM 500 PO) Take 1 tablet by mouth daily.  . chlorpheniramine-HYDROcodone (TUSSIONEX PENNKINETIC ER) 10-8 MG/5ML SUER Take 5 mLs every 12 (twelve) hours as needed by mouth for cough.  . Cholecalciferol (VITAMIN D3) 1000 units CAPS Take 1 capsule by mouth daily.  . Cyanocobalamin (VITAMIN B 12 PO) Take 1 tablet by mouth daily.  . fluticasone (FLONASE) 50 MCG/ACT nasal spray USE 2  SPRAYS IN EACH NOSTRIL DAILY  . glucose blood test strip Use as instructed  . levocetirizine (XYZAL) 5 MG tablet Take 5 mg by mouth every evening.  [name] losartan (COZAAR) 25 MG tablet Take 1 tablet (25 mg total) by mouth daily.  . metFORMIN (GLUCOPHAGE) 500 MG tablet Take 1 tablet (500 mg total) by mouth daily.  . montelukast (SINGULAIR) 10 MG tablet TAKE 1 TABLET DAILY AT BEDTIME  . Multiple Vitamin (MULTIVITAMIN) tablet Take 1 tablet by mouth daily.  [name] omeprazole (PRILOSEC) 40 MG capsule TAKE 1 CAPSULE DAILY  . VITAMIN E PO Take by mouth daily.  [name] azithromycin (ZITHROMAX) 250 MG tablet Take 2 tabs PO x 1 dose, then 1 tab PO QD x 4 days (Patient not taking: Reported on 03/12/2017)  . predniSONE (DELTASONE) 10 MG tablet Take 6 tablets x 1 day and then decrease by 1/2 tablet per day until down to  zero mg.  . [DISCONTINUED] predniSONE (DELTASONE) 20 MG tablet Take 2 tablets x 3 days, then 1 tablet x 3 days (Patient not taking: Reported on 03/12/2017)   No facility-administered encounter medications on file as of 03/12/2017.     Review of Systems  Constitutional: Negative for appetite change and unexpected weight change.  HENT: Positive for congestion and postnasal drip.   Respiratory: Positive for cough and wheezing. Negative for chest tightness and shortness of breath.   Cardiovascular: Negative for chest pain, palpitations and leg swelling.  Gastrointestinal: Negative for abdominal pain, diarrhea, nausea and vomiting.  Musculoskeletal: Negative for joint swelling and myalgias.  Skin: Negative for color change and rash.  Neurological: Negative for dizziness, light-headedness and headaches.  Psychiatric/Behavioral: Negative for agitation and dysphoric mood.       Objective:    Physical Exam  Constitutional: She appears well-developed and well-nourished. No distress.  HENT:  Nose: Nose normal.  Mouth/Throat: Oropharynx is clear and moist.  Neck: Neck supple. No thyromegaly present.  Cardiovascular: Normal rate and regular rhythm.  Pulmonary/Chest: Breath sounds normal. No respiratory distress.  Increased cough with expiration.   Abdominal: Soft. Bowel sounds are normal. There is no tenderness.  Musculoskeletal: She exhibits no edema or tenderness.  Lymphadenopathy:    She has no cervical adenopathy.  Skin: No rash noted. No erythema.  Psychiatric: She has a normal mood and affect. Her behavior is normal.    BP (!) 150/90   Pulse 85   Temp 98.3 F (36.8 C) (Oral)   Resp 18   Ht 5\' 2"  (1.575 m)   Wt 214 lb 2 oz (97.1 kg)   SpO2 98%   BMI 39.16 kg/m  Wt Readings from Last 3 Encounters:  03/12/17 214 lb 2 oz (97.1 kg)  03/06/17 209 lb 12 oz (95.1 kg)  12/19/16 209 lb (94.8 kg)     Lab Results  Component Value Date   WBC 8.3 12/19/2016   HGB 13.3 12/19/2016     HCT 41.4 12/19/2016   PLT 322.0 12/19/2016   GLUCOSE 108 (H) 12/19/2016   CHOL 152 12/19/2016   TRIG 60.0 12/19/2016   HDL 50.80 12/19/2016   LDLCALC 90 12/19/2016   ALT 34 12/19/2016   AST 29 12/19/2016   NA 138 12/19/2016   K 4.4 12/19/2016   CL 104 12/19/2016   CREATININE 0.92 12/19/2016   BUN 14 12/19/2016   CO2 28 12/19/2016   TSH 1.36 02/13/2016   HGBA1C 6.7 (H) 12/19/2016   MICROALBUR 2.8 (H) 02/13/2016    Mm Digital Screening Bilateral  Result Date: 03/20/2016 CLINICAL DATA:  Screening. EXAM: DIGITAL SCREENING BILATERAL MAMMOGRAM WITH CAD COMPARISON:  Previous exam(s). ACR Breast Density Category b: There are scattered areas of fibroglandular density. FINDINGS: There are no findings suspicious for malignancy. Images were processed with CAD. IMPRESSION: No mammographic evidence of malignancy. A result letter of this screening mammogram will be mailed directly to the patient. RECOMMENDATION: Screening mammogram in one year. (Code:SM-B-01Y) BI-RADS CATEGORY  1: Negative. Electronically Signed   By: Fidela Salisbury M.D.   On: 03/20/2016 13:52       Assessment & Plan:   Problem List Items Addressed This Visit    Diabetes ([place])    Will need to monitor sugars while on prednisone.  Discussed with her today.  Follow.        Essential hypertension    Blood pressure on recheck improved.  Same medication regimen. Follow pressures.  Follow metabolic panel.       Respiratory tract infection    Increased cough and congestion as outlined.  Taking zpak.  Will extend prednisone taper.  Still with increased cough and wheezing.  Prednisone taper as directed.  Continue breo and rescue inhaler.  No further abx felt warranted.  Rest.  Fluids.  Steroid nasal spray as directed.  Follow.            Einar Pheasant, MD

## 2017-03-12 NOTE — Telephone Encounter (Signed)
Please call her and let her know that if she is still having problems, I can see her today at 12:30 for work in appt.

## 2017-03-13 ENCOUNTER — Encounter: Payer: Self-pay | Admitting: Internal Medicine

## 2017-03-13 NOTE — Telephone Encounter (Signed)
Please inform pt of losartan recall information.  May need to call pharmacy.

## 2017-03-14 ENCOUNTER — Ambulatory Visit: Payer: BLUE CROSS/BLUE SHIELD | Admitting: Podiatry

## 2017-03-15 ENCOUNTER — Encounter: Payer: Self-pay | Admitting: Internal Medicine

## 2017-03-15 NOTE — Assessment & Plan Note (Signed)
Will need to monitor sugars while on prednisone.  Discussed with her today.  Follow.

## 2017-03-15 NOTE — Assessment & Plan Note (Signed)
Increased cough and congestion as outlined.  Taking zpak.  Will extend prednisone taper.  Still with increased cough and wheezing.  Prednisone taper as directed.  Continue breo and rescue inhaler.  No further abx felt warranted.  Rest.  Fluids.  Steroid nasal spray as directed.  Follow.

## 2017-03-15 NOTE — Assessment & Plan Note (Signed)
Blood pressure on recheck improved.  Same medication regimen.  Follow pressures.  Follow metabolic panel.   

## 2017-03-18 ENCOUNTER — Encounter: Payer: Self-pay | Admitting: Podiatry

## 2017-03-18 ENCOUNTER — Ambulatory Visit: Payer: BLUE CROSS/BLUE SHIELD | Admitting: Podiatry

## 2017-03-18 DIAGNOSIS — M7671 Peroneal tendinitis, right leg: Secondary | ICD-10-CM

## 2017-03-18 DIAGNOSIS — E119 Type 2 diabetes mellitus without complications: Secondary | ICD-10-CM | POA: Diagnosis not present

## 2017-03-18 DIAGNOSIS — S92901S Unspecified fracture of right foot, sequela: Secondary | ICD-10-CM

## 2017-03-18 NOTE — Progress Notes (Signed)
This patient returns to the office follow-up for diagnosis of a possible fracture at the base of the fifth metatarsal right foot and peroneal tendinitis at its insertion at the base of the fifth metatarsal right foot.  She was last seen approximately 4 weeks ago and we decided to get an MRI.  Her MRI was not ordered through this office. Until last week and in the meantime, she ended up having bronchitis.  She says she been on 2 rounds of cortisone and her right foot  fifth metabase  much better.  She says the swelling has diminished.  She returns to the office stating she still wearing her compression sock when she walks.  She presents the office today for continued evaluation and treatment of her right foot   General Appearance  Alert, conversant and in no acute stress.  Vascular  Dorsalis pedis and posterior pulses are palpable  bilaterally.  Capillary return is within normal limits  Bilaterally. Temperature is within normal limits  Bilaterally  Neurologic  Senn-Weinstein monofilament wire test within normal limits  bilaterally. Muscle power  Within normal limits bilaterally.  Nails Thick disfigured discolored nails with subungual debride bilaterally from hallux to fifth toes bilaterally. No evidence of bacterial infection or drainage bilaterally.  Orthopedic  No limitations of motion of motion feet bilaterally.  No crepitus or effusions noted.  No bony pathology or digital deformities noted.pain and swelling at the base of the fifth metatarsal has resolved.  No pain noted along the course of the peroneal tendon right foot  Skin  normotropic skin with no porokeratosis noted bilaterally.  No signs of infections or ulcers noted.    insertional peroneal tendinitis, right foot.  Possible fracture fifth meta-base.  ROV  . We discussed her improvement to her right foot.  We decided to hold off on ordering and getting the MRI on her right foot.  I told her that if the pain continues. We will follow-up  with an x-ray and then an MRI.  She was secured with this decision and will call the office if pain returns.   Gardiner Barefoot DPM

## 2017-03-29 ENCOUNTER — Ambulatory Visit: Payer: BLUE CROSS/BLUE SHIELD | Admitting: Internal Medicine

## 2017-03-29 ENCOUNTER — Encounter: Payer: Self-pay | Admitting: Internal Medicine

## 2017-03-29 DIAGNOSIS — E119 Type 2 diabetes mellitus without complications: Secondary | ICD-10-CM | POA: Diagnosis not present

## 2017-03-29 DIAGNOSIS — I10 Essential (primary) hypertension: Secondary | ICD-10-CM

## 2017-03-29 DIAGNOSIS — J4520 Mild intermittent asthma, uncomplicated: Secondary | ICD-10-CM

## 2017-03-29 DIAGNOSIS — E611 Iron deficiency: Secondary | ICD-10-CM | POA: Diagnosis not present

## 2017-03-29 DIAGNOSIS — J4521 Mild intermittent asthma with (acute) exacerbation: Secondary | ICD-10-CM | POA: Diagnosis not present

## 2017-03-29 DIAGNOSIS — K219 Gastro-esophageal reflux disease without esophagitis: Secondary | ICD-10-CM

## 2017-03-29 MED ORDER — HYDROCOD POLST-CPM POLST ER 10-8 MG/5ML PO SUER: 5.0000 mL | Freq: Two times a day (BID) | ORAL | 0 refills | Status: DC | PRN

## 2017-03-29 MED ORDER — PREDNISONE 10 MG PO TABS: ORAL_TABLET | ORAL | 0 refills | Status: DC

## 2017-03-29 NOTE — Progress Notes (Signed)
Patient ID: Donna Hensley, female   DOB: 02-24-1966, 51 y.o.   MRN: [id number]   Subjective:    Patient ID: Donna Hensley, female    DOB: May 09, 1965, 51 y.o.   MRN: [id number]  HPI  Patient here for a scheduled follow up.  She was recently evaluated and treated for URI with increased cough and wheezing.  She does feel better, but still with increased cough.  Still some sinus pressure.  No unusual headache.  Breathing is better.  No chest pain.  No acid reflux.  No abdominal pain.  Bowels moving.  Handling stress.  States her sugars were ok on prednisone.  Blood sugars averaging 130-140 with an occasional 150.     Past Medical History:  Diagnosis Date  . Allergy   . Asthma   . GERD (gastroesophageal reflux disease)   . Gestational diabetes    Past Surgical History:  Procedure Laterality Date  . APPENDECTOMY  1986  . [place]  . CHOLECYSTECTOMY  2013  . TONSILLECTOMY  1987   Family History  Problem Relation Age of Onset  . Hyperlipidemia Mother   . Osteoporosis Mother   . Hypertension Father   . Arthritis Father   . Sleep apnea Father   . Heart disease Father   . Sleep apnea Brother   . Arthritis Paternal Grandmother   . Diabetes Paternal Grandmother   . Breast cancer Neg Hx    Social History   Socioeconomic History  . Marital status: Married    Spouse name: None  . Number of children: 2  . Years of education: None  . Highest education level: None  Social Needs  . Financial resource strain: None  . Food insecurity - worry: None  . Food insecurity - inability: None  . Transportation needs - medical: None  . Transportation needs - non-medical: None  Occupational History  . 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 Smoking status: Former Smoker    Packs/day: 0.25    Years: 5.00    Pack years: 1.25    Types: Cigarettes    Last attempt to quit: 09/28/1988    Years since quitting: 28.5  . Smokeless tobacco:  Never Used  . Tobacco comment: smoked briefly as a teenager  Substance and Sexual Activity  . Alcohol use: Yes    Alcohol/week: 0.6 oz    Types: 1 Glasses of wine per week    Comment: Occasional 1-2 per month  . Drug use: No  . Sexual activity: None  Other Topics Concern  . None  Social History Narrative   Regular exercise-yes, walk   Diet: fast food, trying to improve diet          Outpatient Encounter Medications as of 03/29/2017  Medication Sig  . albuterol (PROVENTIL HFA;VENTOLIN HFA) 108 (90 Base) MCG/ACT inhaler Inhale 2 puffs into the lungs every 6 (six) hours as needed for wheezing.  [name] azithromycin (ZITHROMAX) 250 MG tablet Take 2 tabs PO x 1 dose, then 1 tab PO QD x 4 days  . BREO ELLIPTA 200-25 MCG/INH AEPB Inhale 1 puff into the lungs daily.   . Calcium-Magnesium-Vitamin D (CALCIUM 500 PO) Take 1 tablet by mouth daily.  . chlorpheniramine-HYDROcodone (TUSSIONEX PENNKINETIC ER) 10-8 MG/5ML SUER Take 5 mLs by mouth every 12 (twelve) hours as needed for cough.  . Cholecalciferol (VITAMIN D3) 1000 units CAPS Take 1 capsule by mouth daily.  . Cyanocobalamin (  VITAMIN B 12 PO) Take 1 tablet by mouth daily.  . fluticasone (FLONASE) 50 MCG/ACT nasal spray USE 2 SPRAYS IN EACH NOSTRIL DAILY  . glucose blood test strip Use as instructed  . levocetirizine (XYZAL) 5 MG tablet Take 5 mg by mouth every evening.  [name] losartan (COZAAR) 25 MG tablet Take 1 tablet (25 mg total) by mouth daily.  . metFORMIN (GLUCOPHAGE) 500 MG tablet Take 1 tablet (500 mg total) by mouth daily.  . montelukast (SINGULAIR) 10 MG tablet TAKE 1 TABLET DAILY AT BEDTIME  . Multiple Vitamin (MULTIVITAMIN) tablet Take 1 tablet by mouth daily.  [name] omeprazole (PRILOSEC) 40 MG capsule TAKE 1 CAPSULE DAILY  . predniSONE (DELTASONE) 10 MG tablet Take 6 tablets x 1 day and then decrease by 1/2 tablet per day until down to zero mg.  [name] VITAMIN E PO Take by mouth daily.  . [DISCONTINUED] chlorpheniramine-HYDROcodone  (TUSSIONEX PENNKINETIC ER) 10-8 MG/5ML SUER Take 5 mLs every 12 (twelve) hours as needed by mouth for cough.  . [DISCONTINUED] predniSONE (DELTASONE) 10 MG tablet Take 6 tablets x 1 day and then decrease by 1/2 tablet per day until down to zero mg.   No facility-administered encounter medications on file as of 03/29/2017.     Review of Systems  Constitutional: Negative for appetite change and unexpected weight change.  HENT: Positive for congestion and sinus pressure.   Respiratory: Positive for cough. Negative for chest tightness and shortness of breath.   Cardiovascular: Negative for chest pain, palpitations and leg swelling.  Gastrointestinal: Negative for abdominal pain, diarrhea, nausea and vomiting.  Genitourinary: Negative for difficulty urinating and dysuria.  Musculoskeletal: Negative for back pain and joint swelling.  Skin: Negative for color change and rash.  Neurological: Negative for dizziness, light-headedness and headaches.  Psychiatric/Behavioral: Negative for agitation and dysphoric mood.       Objective:    Physical Exam  Constitutional: She appears well-developed and well-nourished. No distress.  HENT:  Nose: Nose normal.  Mouth/Throat: Oropharynx is clear and moist.  Neck: Neck supple. No thyromegaly present.  Cardiovascular: Normal rate and regular rhythm.  Pulmonary/Chest: Breath sounds normal. No respiratory distress. She has no wheezes.  Increased cough with forced expiration.  Does have increased air movement when compared to previous.    Abdominal: Soft. Bowel sounds are normal. There is no tenderness.  Musculoskeletal: She exhibits no edema or tenderness.  Lymphadenopathy:    She has no cervical adenopathy.  Skin: No rash noted. No erythema.  Psychiatric: She has a normal mood and affect. Her behavior is normal.    BP 136/88   Pulse 86   Temp 98.5 F (36.9 C) (Oral)   Ht 5' 2"  (1.575 m)   Wt 209 lb 12.8 oz (95.2 kg)   SpO2 96%   BMI 38.37 kg/m   Wt Readings from Last 3 Encounters:  03/29/17 209 lb 12.8 oz (95.2 kg)  03/12/17 214 lb 2 oz (97.1 kg)  03/06/17 209 lb 12 oz (95.1 kg)     Lab Results  Component Value Date   WBC 8.3 12/19/2016   HGB 13.3 12/19/2016   HCT 41.4 12/19/2016   PLT 322.0 12/19/2016   GLUCOSE 108 (H) 12/19/2016   CHOL 152 12/19/2016   TRIG 60.0 12/19/2016   HDL 50.80 12/19/2016   LDLCALC 90 12/19/2016   ALT 34 12/19/2016   AST 29 12/19/2016   NA 138 12/19/2016   K 4.4 12/19/2016   CL 104 12/19/2016   CREATININE 0.92  12/19/2016   BUN 14 12/19/2016   CO2 28 12/19/2016   TSH 1.36 02/13/2016   HGBA1C 6.7 (H) 12/19/2016   MICROALBUR 2.8 (H) 02/13/2016    Mm Digital Screening Bilateral  Result Date: 03/20/2016 CLINICAL DATA:  Screening. EXAM: DIGITAL SCREENING BILATERAL MAMMOGRAM WITH CAD COMPARISON:  Previous exam(s). ACR Breast Density Category b: There are scattered areas of fibroglandular density. FINDINGS: There are no findings suspicious for malignancy. Images were processed with CAD. IMPRESSION: No mammographic evidence of malignancy. A result letter of this screening mammogram will be mailed directly to the patient. RECOMMENDATION: Screening mammogram in one year. (Code:SM-B-01Y) BI-RADS CATEGORY  1: Negative. Electronically Signed   By: Fidela Salisbury M.D.   On: 03/20/2016 13:52       Assessment & Plan:   Problem List Items Addressed This Visit    Diabetes ([place])    Low carb diet and exercise.  Sugars as outlined.  Follow met b and a1c.        Relevant Orders   Lipid panel   Hepatic function panel   Hemoglobin A1c   Essential hypertension, benign    Treat current flare.  She is not sleeping well with increased cough.  Have her spot check her pressure.  Same medication regimen.  Follow met b.        Relevant Orders   TSH   Basic metabolic panel   GERD    Controlled on omeprazole.        Iron deficiency    Follow cbc and ferritin.       Relevant Orders   CBC with  Differential/Platelet   Ferritin   Mild intermittent asthma in adult without complication    Has seen an allergist.  Using breo.  Taking singulair and zyrtec.  Also using a steroid nasal spray.  Being treated for recent flare and infection.  Is better.  Do not feel abx warranted.  She is still having increased cough, keeping up up at night. Not sleeping.  Will give her another prednisone taper.  Continue breo, singulair and zyrtec.  Follow.  If persistent problems, will need referral back to specialist.        Relevant Medications   predniSONE (DELTASONE) 10 MG tablet    Other Visit Diagnoses    Mild intermittent asthma with acute exacerbation       Relevant Medications   chlorpheniramine-HYDROcodone (TUSSIONEX PENNKINETIC ER) 10-8 MG/5ML SUER   predniSONE (DELTASONE) 10 MG tablet       Einar Pheasant, MD

## 2017-03-29 NOTE — Progress Notes (Signed)
Pre visit review using our clinic review tool, if applicable. No additional management support is needed unless otherwise documented below in the visit note. 

## 2017-03-31 ENCOUNTER — Encounter: Payer: Self-pay | Admitting: Internal Medicine

## 2017-03-31 NOTE — Assessment & Plan Note (Signed)
Has seen an allergist.  Using breo.  Taking singulair and zyrtec.  Also using a steroid nasal spray.  Being treated for recent flare and infection.  Is better.  Do not feel abx warranted.  She is still having increased cough, keeping up up at night. Not sleeping.  Will give her another prednisone taper.  Continue breo, singulair and zyrtec.  Follow.  If persistent problems, will need referral back to specialist.

## 2017-03-31 NOTE — Assessment & Plan Note (Signed)
Follow cbc and ferritin.  

## 2017-03-31 NOTE — Assessment & Plan Note (Signed)
Controlled on omeprazole.   

## 2017-03-31 NOTE — Assessment & Plan Note (Signed)
Treat current flare.  She is not sleeping well with increased cough.  Have her spot check her pressure.  Same medication regimen.  Follow met b.

## 2017-03-31 NOTE — Assessment & Plan Note (Signed)
Low carb diet and exercise.  Sugars as outlined.  Follow met b and a1c.   

## 2017-04-04 ENCOUNTER — Encounter: Payer: Self-pay | Admitting: Internal Medicine

## 2017-04-06 MED ORDER — FIRST-DUKES MOUTHWASH MT SUSP: OROMUCOSAL | 0 refills | Status: DC

## 2017-04-06 NOTE — Telephone Encounter (Signed)
rx sent in for [place] Mouthwash.

## 2017-04-26 ENCOUNTER — Other Ambulatory Visit: Payer: BLUE CROSS/BLUE SHIELD

## 2017-05-06 ENCOUNTER — Encounter: Payer: Self-pay | Admitting: Internal Medicine

## 2017-05-06 DIAGNOSIS — J988 Other specified respiratory disorders: Secondary | ICD-10-CM

## 2017-05-06 DIAGNOSIS — J4520 Mild intermittent asthma, uncomplicated: Secondary | ICD-10-CM

## 2017-05-06 NOTE — Telephone Encounter (Signed)
She has seen pulmonary previously.  Given the persistence and given multiple rounds of prednisone, I feel she needs f/u with pulmonary.  If agreeable, let me know and I will place the order to schedule the appt.  This may save her two trips.

## 2017-05-06 NOTE — Telephone Encounter (Signed)
Order placed for pulmonary referral.  

## 2017-06-04 ENCOUNTER — Other Ambulatory Visit: Payer: Self-pay | Admitting: Internal Medicine

## 2017-06-06 DIAGNOSIS — J4530 Mild persistent asthma, uncomplicated: Secondary | ICD-10-CM | POA: Diagnosis not present

## 2017-06-06 DIAGNOSIS — J3089 Other allergic rhinitis: Secondary | ICD-10-CM | POA: Diagnosis not present

## 2017-06-06 DIAGNOSIS — H1045 Other chronic allergic conjunctivitis: Secondary | ICD-10-CM | POA: Diagnosis not present

## 2017-06-13 DIAGNOSIS — J3089 Other allergic rhinitis: Secondary | ICD-10-CM | POA: Diagnosis not present

## 2017-06-17 ENCOUNTER — Other Ambulatory Visit: Payer: Self-pay | Admitting: Internal Medicine

## 2017-06-18 DIAGNOSIS — J3089 Other allergic rhinitis: Secondary | ICD-10-CM | POA: Diagnosis not present

## 2017-06-20 DIAGNOSIS — J3089 Other allergic rhinitis: Secondary | ICD-10-CM | POA: Diagnosis not present

## 2017-06-25 DIAGNOSIS — J3089 Other allergic rhinitis: Secondary | ICD-10-CM | POA: Diagnosis not present

## 2017-06-27 ENCOUNTER — Other Ambulatory Visit (INDEPENDENT_AMBULATORY_CARE_PROVIDER_SITE_OTHER): Payer: BLUE CROSS/BLUE SHIELD

## 2017-06-27 DIAGNOSIS — E611 Iron deficiency: Secondary | ICD-10-CM

## 2017-06-27 DIAGNOSIS — J3089 Other allergic rhinitis: Secondary | ICD-10-CM | POA: Diagnosis not present

## 2017-06-27 DIAGNOSIS — I10 Essential (primary) hypertension: Secondary | ICD-10-CM

## 2017-06-27 DIAGNOSIS — E119 Type 2 diabetes mellitus without complications: Secondary | ICD-10-CM | POA: Diagnosis not present

## 2017-06-27 LAB — CBC WITH DIFFERENTIAL/PLATELET
Basophils Absolute: 0.1 10*3/uL (ref 0.0–0.1)
Basophils Relative: 0.9 % (ref 0.0–3.0)
Eosinophils Absolute: 0.2 10*3/uL (ref 0.0–0.7)
Eosinophils Relative: 2.1 % (ref 0.0–5.0)
HCT: 42.9 % (ref 36.0–46.0)
Hemoglobin: 14 g/dL (ref 12.0–15.0)
Lymphocytes Relative: 28.6 % (ref 12.0–46.0)
Lymphs Abs: 2.8 10*3/uL (ref 0.7–4.0)
MCHC: 32.7 g/dL (ref 30.0–36.0)
MCV: 78.9 fl (ref 78.0–100.0)
Monocytes Absolute: 0.6 10*3/uL (ref 0.1–1.0)
Monocytes Relative: 6.3 % (ref 3.0–12.0)
Neutro Abs: 6.2 10*3/uL (ref 1.4–7.7)
Neutrophils Relative %: 62.1 % (ref 43.0–77.0)
Platelets: 308 10*3/uL (ref 150.0–400.0)
RBC: 5.44 Mil/uL — ABNORMAL HIGH (ref 3.87–5.11)
RDW: 17.8 % — ABNORMAL HIGH (ref 11.5–15.5)
WBC: 9.9 10*3/uL (ref 4.0–10.5)

## 2017-06-27 LAB — HEPATIC FUNCTION PANEL
ALT: 27 U/L (ref 0–35)
AST: 17 U/L (ref 0–37)
Albumin: 3.8 g/dL (ref 3.5–5.2)
Alkaline Phosphatase: 105 U/L (ref 39–117)
Bilirubin, Direct: 0.2 mg/dL (ref 0.0–0.3)
Total Bilirubin: 0.6 mg/dL (ref 0.2–1.2)
Total Protein: 7.6 g/dL (ref 6.0–8.3)

## 2017-06-27 LAB — HEMOGLOBIN A1C: Hgb A1c MFr Bld: 7.3 % — ABNORMAL HIGH (ref 4.6–6.5)

## 2017-06-27 LAB — BASIC METABOLIC PANEL
BUN: 14 mg/dL (ref 6–23)
CO2: 28 mEq/L (ref 19–32)
Calcium: 9.8 mg/dL (ref 8.4–10.5)
Chloride: 103 mEq/L (ref 96–112)
Creatinine, Ser: 1.02 mg/dL (ref 0.40–1.20)
GFR: 61.16 mL/min (ref >60.00)
Glucose, Bld: 115 mg/dL — ABNORMAL HIGH (ref 70–99)
Potassium: 4 mEq/L (ref 3.5–5.1)
Sodium: 139 mEq/L (ref 135–145)

## 2017-06-27 LAB — FERRITIN: Ferritin: 9.1 ng/mL — ABNORMAL LOW (ref 10.0–291.0)

## 2017-06-27 LAB — LIPID PANEL
Cholesterol: 159 mg/dL (ref 0–200)
HDL: 63.2 mg/dL (ref >39.00)
LDL Cholesterol: 84 mg/dL (ref 0–99)
NonHDL: 95.43
Total CHOL/HDL Ratio: 3
Triglycerides: 57 mg/dL (ref 0.0–149.0)
VLDL: 11.4 mg/dL (ref 0.0–40.0)

## 2017-06-27 LAB — TSH: TSH: 1.61 u[IU]/mL (ref 0.35–4.50)

## 2017-06-29 ENCOUNTER — Other Ambulatory Visit: Payer: Self-pay | Admitting: Internal Medicine

## 2017-06-29 NOTE — Progress Notes (Signed)
Opened in error

## 2017-07-02 DIAGNOSIS — J3089 Other allergic rhinitis: Secondary | ICD-10-CM | POA: Diagnosis not present

## 2017-07-04 DIAGNOSIS — J3089 Other allergic rhinitis: Secondary | ICD-10-CM | POA: Diagnosis not present

## 2017-07-06 ENCOUNTER — Other Ambulatory Visit: Payer: Self-pay | Admitting: Internal Medicine

## 2017-07-09 DIAGNOSIS — J3089 Other allergic rhinitis: Secondary | ICD-10-CM | POA: Diagnosis not present

## 2017-07-11 DIAGNOSIS — J3089 Other allergic rhinitis: Secondary | ICD-10-CM | POA: Diagnosis not present

## 2017-07-16 ENCOUNTER — Ambulatory Visit (INDEPENDENT_AMBULATORY_CARE_PROVIDER_SITE_OTHER): Payer: BLUE CROSS/BLUE SHIELD | Admitting: Internal Medicine

## 2017-07-16 ENCOUNTER — Encounter: Payer: Self-pay | Admitting: Internal Medicine

## 2017-07-16 VITALS — BP 158/92 | HR 85 | Temp 97.9°F | Resp 18 | Wt 212.0 lb

## 2017-07-16 DIAGNOSIS — Z1239 Encounter for other screening for malignant neoplasm of breast: Secondary | ICD-10-CM

## 2017-07-16 DIAGNOSIS — Z1231 Encounter for screening mammogram for malignant neoplasm of breast: Secondary | ICD-10-CM

## 2017-07-16 DIAGNOSIS — J3089 Other allergic rhinitis: Secondary | ICD-10-CM | POA: Diagnosis not present

## 2017-07-16 DIAGNOSIS — K219 Gastro-esophageal reflux disease without esophagitis: Secondary | ICD-10-CM

## 2017-07-16 DIAGNOSIS — E119 Type 2 diabetes mellitus without complications: Secondary | ICD-10-CM | POA: Diagnosis not present

## 2017-07-16 DIAGNOSIS — J4520 Mild intermittent asthma, uncomplicated: Secondary | ICD-10-CM

## 2017-07-16 DIAGNOSIS — I10 Essential (primary) hypertension: Secondary | ICD-10-CM | POA: Diagnosis not present

## 2017-07-16 DIAGNOSIS — Z0000 Encounter for general adult medical examination without abnormal findings: Secondary | ICD-10-CM

## 2017-07-16 DIAGNOSIS — E611 Iron deficiency: Secondary | ICD-10-CM | POA: Diagnosis not present

## 2017-07-16 MED ORDER — LOSARTAN POTASSIUM 50 MG PO TABS: 50.0000 mg | ORAL_TABLET | Freq: Every day | ORAL | 1 refills | Status: DC

## 2017-07-16 NOTE — Progress Notes (Signed)
Patient ID: Donna Hensley, female   DOB: 31-Dec-1965, 52 y.o.   MRN: [id number]   Subjective:    Patient ID: Donna Hensley, female    DOB: 1965/12/24, 52 y.o.   MRN: [id number]  HPI  Patient here for her physical.  She reports she is doing relatively well.  Seeing an allergist now.  Back on allergy shots 2x/week.  Using breo.  Breathing better.  No increased cough or congestion.  No acid reflux.  No abdominal pain.  Bowels moving.  No urine change.  States am sugars averaging 98-113.  Doing better since off prednisone.     Past Medical History:  Diagnosis Date  . Allergy   . Asthma   . GERD (gastroesophageal reflux disease)   . Gestational diabetes    Past Surgical History:  Procedure Laterality Date  . APPENDECTOMY  1986  . [place]  . CHOLECYSTECTOMY  2013  . TONSILLECTOMY  1987   Family History  Problem Relation Age of Onset  . Hyperlipidemia Mother   . Osteoporosis Mother   . Hypertension Father   . Arthritis Father   . Sleep apnea Father   . Heart disease Father   . Sleep apnea Brother   . Arthritis Paternal Grandmother   . Diabetes Paternal Grandmother   . Breast cancer Neg Hx    Social History   Socioeconomic History  . Marital status: Married    Spouse name: Not on file  . Number of children: 2  . Years of education: Not on file  . Highest education level: Not on file  Occupational History  . 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 Financial resource strain: Not on file  . Food insecurity:    Worry: Not on file    Inability: Not on file  . Transportation needs:    Medical: Not on file    Non-medical: Not on file  Tobacco Use  . Smoking status: Former Smoker    Packs/day: 0.25    Years: 5.00    Pack years: 1.25    Types: Cigarettes    Last attempt to quit: 09/28/1988    Years since quitting: 28.8  . Smokeless tobacco: Never Used  . Tobacco comment: smoked briefly as a teenager    Substance and Sexual Activity  . Alcohol use: Yes    Alcohol/week: 0.6 oz    Types: 1 Glasses of wine per week    Comment: Occasional 1-2 per month  . Drug use: No  . Sexual activity: Not on file  Lifestyle  . Physical activity:    Days per week: Not on file    Minutes per session: Not on file  . Stress: Not on file  Relationships  . Social connections:    Talks on phone: Not on file    Gets together: Not on file    Attends religious service: Not on file    Active member of club or organization: Not on file    Attends meetings of clubs or organizations: Not on file    Relationship status: Not on file  Other Topics Concern  . Not on file  Social History Narrative   Regular exercise-yes, walk   Diet: fast food, trying to improve diet          Outpatient Encounter Medications as of 07/16/2017  Medication Sig  . albuterol (PROVENTIL HFA;VENTOLIN HFA) 108 (90 Base) MCG/ACT inhaler Inhale 2 puffs into  the lungs every 6 (six) hours as needed for wheezing.  [name] azithromycin (ZITHROMAX) 250 MG tablet Take 2 tabs PO x 1 dose, then 1 tab PO QD x 4 days  . BREO ELLIPTA 200-25 MCG/INH AEPB Inhale 1 puff into the lungs daily.   . Calcium-Magnesium-Vitamin D (CALCIUM 500 PO) Take 1 tablet by mouth daily.  . chlorpheniramine-HYDROcodone (TUSSIONEX PENNKINETIC ER) 10-8 MG/5ML SUER Take 5 mLs by mouth every 12 (twelve) hours as needed for cough.  . Cholecalciferol (VITAMIN D3) 1000 units CAPS Take 1 capsule by mouth daily.  . Cyanocobalamin (VITAMIN B 12 PO) Take 1 tablet by mouth daily.  . Diphenhyd-Hydrocort-Nystatin (FIRST-DUKES MOUTHWASH) SUSP 5cc's swish and spit tid  . fluticasone (FLONASE) 50 MCG/ACT nasal spray USE 2 SPRAYS IN EACH NOSTRIL DAILY  . glucose blood test strip Use as instructed  . levocetirizine (XYZAL) 5 MG tablet Take 5 mg by mouth every evening.  [name] losartan (COZAAR) 50 MG tablet Take 1 tablet (50 mg total) by mouth daily.  . metFORMIN (GLUCOPHAGE) 500 MG tablet TAKE 1  TABLET DAILY  . montelukast (SINGULAIR) 10 MG tablet TAKE 1 TABLET DAILY AT BEDTIME  . Multiple Vitamin (MULTIVITAMIN) tablet Take 1 tablet by mouth daily.  [name] omeprazole (PRILOSEC) 40 MG capsule TAKE 1 CAPSULE DAILY  . predniSONE (DELTASONE) 10 MG tablet Take 6 tablets x 1 day and then decrease by 1/2 tablet per day until down to zero mg.  [name] VITAMIN E PO Take by mouth daily.  . [DISCONTINUED] losartan (COZAAR) 25 MG tablet TAKE 1 TABLET DAILY   No facility-administered encounter medications on file as of 07/16/2017.     Review of Systems  Constitutional: Negative for appetite change and unexpected weight change.  HENT: Negative for congestion and sinus pressure.   Eyes: Negative for pain and visual disturbance.  Respiratory: Negative for cough, chest tightness and shortness of breath.   Cardiovascular: Negative for chest pain, palpitations and leg swelling.  Gastrointestinal: Negative for abdominal pain, diarrhea, nausea and vomiting.  Genitourinary: Negative for difficulty urinating and dysuria.  Musculoskeletal: Negative for joint swelling and myalgias.  Skin: Negative for color change and rash.  Neurological: Negative for dizziness, light-headedness and headaches.  Hematological: Negative for adenopathy. Does not bruise/bleed easily.  Psychiatric/Behavioral: Negative for agitation and dysphoric mood.       Objective:    Physical Exam  Constitutional: She is oriented to person, place, and time. She appears well-developed and well-nourished. No distress.  HENT:  Nose: Nose normal.  Mouth/Throat: Oropharynx is clear and moist.  Eyes: Right eye exhibits no discharge. Left eye exhibits no discharge. No scleral icterus.  Neck: Neck supple. No thyromegaly present.  Cardiovascular: Normal rate and regular rhythm.  Pulmonary/Chest: Breath sounds normal. No accessory muscle usage. No tachypnea. No respiratory distress. She has no decreased breath sounds. She has no wheezes. She has no  rhonchi. Right breast exhibits no inverted nipple, no mass, no nipple discharge and no tenderness (no axillary adenopathy). Left breast exhibits no inverted nipple, no mass, no nipple discharge and no tenderness (no axilarry adenopathy).  Abdominal: Soft. Bowel sounds are normal. There is no tenderness.  Musculoskeletal: She exhibits no edema or tenderness.  Feet:  DP pulses palpable and equal bilaterally.  No lesions.  Intact to light touch and pin prick.    Lymphadenopathy:    She has no cervical adenopathy.  Neurological: She is alert and oriented to person, place, and time.  Skin: Skin is warm. No rash noted.  No erythema.  Psychiatric: She has a normal mood and affect. Her behavior is normal.    BP (!) 158/92 (BP Location: Left Arm, Patient Position: Sitting, Cuff Size: Large)   Pulse 85   Temp 97.9 F (36.6 C) (Oral)   Resp 18   Wt 212 lb (96.2 kg)   SpO2 97%   BMI 38.78 kg/m  Wt Readings from Last 3 Encounters:  07/16/17 212 lb (96.2 kg)  03/29/17 209 lb 12.8 oz (95.2 kg)  03/12/17 214 lb 2 oz (97.1 kg)     Lab Results  Component Value Date   WBC 9.9 06/27/2017   HGB 14.0 06/27/2017   HCT 42.9 06/27/2017   PLT 308.0 06/27/2017   GLUCOSE 115 (H) 06/27/2017   CHOL 159 06/27/2017   TRIG 57.0 06/27/2017   HDL 63.20 06/27/2017   LDLCALC 84 06/27/2017   ALT 27 06/27/2017   AST 17 06/27/2017   NA 139 06/27/2017   K 4.0 06/27/2017   CL 103 06/27/2017   CREATININE 1.02 06/27/2017   BUN 14 06/27/2017   CO2 28 06/27/2017   TSH 1.61 06/27/2017   HGBA1C 7.3 (H) 06/27/2017   MICROALBUR 2.8 (H) 02/13/2016    Mm Digital Screening Bilateral  Result Date: 03/20/2016 CLINICAL DATA:  Screening. EXAM: DIGITAL SCREENING BILATERAL MAMMOGRAM WITH CAD COMPARISON:  Previous exam(s). ACR Breast Density Category b: There are scattered areas of fibroglandular density. FINDINGS: There are no findings suspicious for malignancy. Images were processed with CAD. IMPRESSION: No mammographic  evidence of malignancy. A result letter of this screening mammogram will be mailed directly to the patient. RECOMMENDATION: Screening mammogram in one year. (Code:SM-B-01Y) BI-RADS CATEGORY  1: Negative. Electronically Signed   By: Fidela Salisbury M.D.   On: 03/20/2016 13:52       Assessment & Plan:   Problem List Items Addressed This Visit    Diabetes ([place])    Low carb diet and exercise.  Sugars as outlined.  Follow met b and a1c.        Relevant Medications   losartan (COZAAR) 50 MG tablet   Essential hypertension    Blood pressure on recheck improved, but still elevated.  Increase losartan to 58m q day.  Follow pressures.  Get her back in soon to reassess.        Relevant Medications   losartan (COZAAR) 50 MG tablet   GERD    On omeprazole.  Controlled.        Health care maintenance    Physical today 07/16/17.  PAP 01/2016 - negative with negative HPV.  Mammogram ordered today.        Iron deficiency    Follow cbc and ferritin.        Mild intermittent asthma in adult without complication    Doing well with allergy shots and breo.  Breathing stable.  Follow.         Other Visit Diagnoses    Breast cancer screening    -  Primary   Relevant Orders   MM SCREENING BREAST TOMO BILATERAL       SEinar Pheasant MD

## 2017-07-16 NOTE — Assessment & Plan Note (Signed)
Physical today 07/16/17.  PAP 01/2016 - negative with negative HPV.  Mammogram ordered today.

## 2017-07-18 DIAGNOSIS — J3089 Other allergic rhinitis: Secondary | ICD-10-CM | POA: Diagnosis not present

## 2017-07-20 ENCOUNTER — Encounter: Payer: Self-pay | Admitting: Internal Medicine

## 2017-07-20 NOTE — Assessment & Plan Note (Signed)
Blood pressure on recheck improved, but still elevated.  Increase losartan to 50mg  q day.  Follow pressures.  Get her back in soon to reassess.

## 2017-07-20 NOTE — Assessment & Plan Note (Signed)
On omeprazole.  Controlled.   

## 2017-07-20 NOTE — Assessment & Plan Note (Signed)
Low carb diet and exercise.  Sugars as outlined.  Follow met b and a1c.   

## 2017-07-20 NOTE — Assessment & Plan Note (Signed)
Doing well with allergy shots and breo.  Breathing stable.  Follow.

## 2017-07-20 NOTE — Assessment & Plan Note (Signed)
Follow cbc and ferritin.  

## 2017-07-23 DIAGNOSIS — J3089 Other allergic rhinitis: Secondary | ICD-10-CM | POA: Diagnosis not present

## 2017-07-25 DIAGNOSIS — J3089 Other allergic rhinitis: Secondary | ICD-10-CM | POA: Diagnosis not present

## 2017-07-30 ENCOUNTER — Other Ambulatory Visit: Payer: Self-pay | Admitting: Internal Medicine

## 2017-07-30 DIAGNOSIS — J3089 Other allergic rhinitis: Secondary | ICD-10-CM | POA: Diagnosis not present

## 2017-08-01 DIAGNOSIS — J3089 Other allergic rhinitis: Secondary | ICD-10-CM | POA: Diagnosis not present

## 2017-08-06 ENCOUNTER — Ambulatory Visit
Admission: RE | Admit: 2017-08-06 | Discharge: 2017-08-06 | Disposition: A | Discharge status: Home / Self Care | Payer: BLUE CROSS/BLUE SHIELD | Source: Ambulatory Visit | Attending: Internal Medicine | Admitting: Internal Medicine

## 2017-08-06 DIAGNOSIS — Z1239 Encounter for other screening for malignant neoplasm of breast: Secondary | ICD-10-CM

## 2017-08-06 DIAGNOSIS — Z1231 Encounter for screening mammogram for malignant neoplasm of breast: Secondary | ICD-10-CM | POA: Diagnosis not present

## 2017-08-06 DIAGNOSIS — J3089 Other allergic rhinitis: Secondary | ICD-10-CM | POA: Diagnosis not present

## 2017-08-08 DIAGNOSIS — J3089 Other allergic rhinitis: Secondary | ICD-10-CM | POA: Diagnosis not present

## 2017-08-13 DIAGNOSIS — J3089 Other allergic rhinitis: Secondary | ICD-10-CM | POA: Diagnosis not present

## 2017-08-15 DIAGNOSIS — J3089 Other allergic rhinitis: Secondary | ICD-10-CM | POA: Diagnosis not present

## 2017-08-20 DIAGNOSIS — J3089 Other allergic rhinitis: Secondary | ICD-10-CM | POA: Diagnosis not present

## 2017-08-22 DIAGNOSIS — J3089 Other allergic rhinitis: Secondary | ICD-10-CM | POA: Diagnosis not present

## 2017-08-27 DIAGNOSIS — J3089 Other allergic rhinitis: Secondary | ICD-10-CM | POA: Diagnosis not present

## 2017-09-03 ENCOUNTER — Ambulatory Visit: Payer: BLUE CROSS/BLUE SHIELD | Admitting: Internal Medicine

## 2017-09-03 DIAGNOSIS — J3089 Other allergic rhinitis: Secondary | ICD-10-CM | POA: Diagnosis not present

## 2017-09-10 ENCOUNTER — Encounter: Payer: Self-pay | Admitting: Family Medicine

## 2017-09-10 ENCOUNTER — Ambulatory Visit: Payer: BLUE CROSS/BLUE SHIELD | Admitting: Family Medicine

## 2017-09-10 VITALS — BP 136/90 | HR 88 | Temp 98.6°F | Resp 12 | Wt 211.0 lb

## 2017-09-10 DIAGNOSIS — J029 Acute pharyngitis, unspecified: Secondary | ICD-10-CM | POA: Diagnosis not present

## 2017-09-10 DIAGNOSIS — H66002 Acute suppurative otitis media without spontaneous rupture of ear drum, left ear: Secondary | ICD-10-CM | POA: Diagnosis not present

## 2017-09-10 LAB — POCT RAPID STREP A (OFFICE): Rapid Strep A Screen: NEGATIVE

## 2017-09-10 MED ORDER — AMOXICILLIN-POT CLAVULANATE 875-125 MG PO TABS: 1.0000 | ORAL_TABLET | Freq: Two times a day (BID) | ORAL | 0 refills | Status: DC

## 2017-09-10 NOTE — Progress Notes (Signed)
Patient ID: Donna Hensley, female   DOB: [birth year], 52 y.o.   MRN: [id number]  PCP: Einar Pheasant, MD  Subjective:  Donna Hensley is a 52 y.o. year old very pleasant female patient who presents with symptoms of sore throat, ear pain, and cough that is minimally productive of clear sputum Associated fever at home with Tmax of 100.3 per patient, no fever today -started: 4 days ago , symptoms are not improving -previous treatments: Advil has provided limited benefit -sick contacts/travel/risks: denies flu exposure. Denies recent sick contact exposure -Hx of: allergies  No recent antibiotic use in the last 30 days. Not a current smoker. Former smoker with 1.25 pack year. Quit 1990 She is followed by an allergy provider with injections once weekly   ROS-denies SOB, NVD, tooth pain  Pertinent Past Medical History- HTN, mild intermittent asthma, and Diabetes  Medications- reviewed  Current Outpatient Medications  Medication Sig Dispense Refill  . albuterol (PROVENTIL HFA;VENTOLIN HFA) 108 (90 Base) MCG/ACT inhaler Inhale 2 puffs into the lungs every 6 (six) hours as needed for wheezing. 6.7 g 2  . BREO ELLIPTA 200-25 MCG/INH AEPB Inhale 1 puff into the lungs daily.     . Calcium-Magnesium-Vitamin D (CALCIUM 500 PO) Take 1 tablet by mouth daily.    . Cholecalciferol (VITAMIN D3) 1000 units CAPS Take 1 capsule by mouth daily.    . Cyanocobalamin (VITAMIN B 12 PO) Take 1 tablet by mouth daily.    . fluticasone (FLONASE) 50 MCG/ACT nasal spray USE 2 SPRAYS IN EACH NOSTRIL DAILY 48 g 3  . glucose blood test strip Use as instructed 200 each 3  . levocetirizine (XYZAL) 5 MG tablet Take 5 mg by mouth every evening.  3  . losartan (COZAAR) 50 MG tablet Take 1 tablet (50 mg total) by mouth daily. 90 tablet 1  . metFORMIN (GLUCOPHAGE) 500 MG tablet TAKE 1 TABLET DAILY 90 tablet 1  . montelukast (SINGULAIR) 10 MG tablet TAKE 1 TABLET DAILY AT BEDTIME 90 tablet 3  . Multiple Vitamin  (MULTIVITAMIN) tablet Take 1 tablet by mouth daily.    [name] omeprazole (PRILOSEC) 40 MG capsule TAKE 1 CAPSULE DAILY 90 capsule 2  . VITAMIN E PO Take by mouth daily.     No current facility-administered medications for this visit.     Objective: BP (!) 140/100 (BP Location: Left Arm, Patient Position: Sitting, Cuff Size: Normal)   Pulse 88   Temp 98.6 F (37 C) (Oral)   Resp 12   Wt 211 lb (95.7 kg)   SpO2 98%   BMI 38.59 kg/m  Gen: NAD, resting comfortably HEENT: Turbinates erythematous, oropharynx mildly erythematous, no exudate or lesions noted; Right TM dull; left TM erythematous and mildly bulging; no sinus tenderness CV: RRR no murmurs rubs or gallops Lungs: CTAB no crackles, wheeze, rhonchi Abdomen: soft/nontender/nondistended/normal bowel sounds. No rebound or guarding.  Ext: no edema Skin: warm, dry, no rash Neuro: grossly normal, moves all extremities  Assessment/Plan: 1. Acute suppurative otitis media of left ear without spontaneous rupture of tympanic membrane, recurrence not specified Exam and history are most consistent with AOM; we discussed treatment options and will initiate Augmentin today and advised use of acetaminophen for discomfort. Retake of BP: 136/90 Left arm. Suspect first reading was elevated by improper cuff size - amoxicillin-clavulanate (AUGMENTIN) 875-125 MG tablet; Take 1 tablet by mouth 2 (two) times daily.  Dispense: 14 tablet; Refill: 0  2. Pharyngitis, unspecified etiology Rapid strep is negative today; mild erythema  present; advised use of warm salt water gargles today and she will complete Augmentin for symptoms of AOM.  - POCT rapid strep A  Finally, we reviewed reasons to return to care including if symptoms worsen or persist or new concerns arise- once again particularly shortness of breath, fever, stiff neck, or redness around ear.   Donna Quint, FNP

## 2017-09-10 NOTE — Patient Instructions (Signed)
Please take medication with food as directed.  Please drink plenty of water so that your urine is pale yellow or clear. Also, get plenty of rest, use tylenol as needed for discomfort and follow up if symptoms do not improve in 3 to 4 days, worsen, or you develop a fever >101. Do not exceed Tylenol dosing as indicated on package.  Warm salt water gargles can be used for sore throat symptom.   Otitis Media, Adult Otitis media is redness, soreness, and puffiness (swelling) in the space just behind your eardrum (middle ear). It may be caused by allergies or infection. It often happens along with a cold. Follow these instructions at home:  Take your medicine as told. Finish it even if you start to feel better.  Only take over-the-counter or prescription medicines for pain, discomfort, or fever as told by your doctor.  Follow up with your doctor as told. Contact a doctor if:  You have otitis media only in one ear, or bleeding from your nose, or both.  You notice a lump on your neck.  You are not getting better in 3-5 days.  You feel worse instead of better. Get help right away if:  You have pain that is not helped with medicine.  You have puffiness, redness, or pain around your ear.  You get a stiff neck.  You cannot move part of your face (paralysis).  You notice that the bone behind your ear hurts when you touch it. This information is not intended to replace advice given to you by your health care provider. Make sure you discuss any questions you have with your health care provider. Document Released: 10/03/2007 Document Revised: 09/22/2015 Document Reviewed: 11/11/2012 Elsevier Interactive Patient Education  2017 [employer].

## 2017-09-12 ENCOUNTER — Ambulatory Visit: Payer: BLUE CROSS/BLUE SHIELD | Admitting: Family Medicine

## 2017-09-13 DIAGNOSIS — J3089 Other allergic rhinitis: Secondary | ICD-10-CM | POA: Diagnosis not present

## 2017-09-18 ENCOUNTER — Encounter: Payer: Self-pay | Admitting: Internal Medicine

## 2017-09-18 ENCOUNTER — Ambulatory Visit: Payer: BLUE CROSS/BLUE SHIELD | Admitting: Internal Medicine

## 2017-09-18 DIAGNOSIS — E611 Iron deficiency: Secondary | ICD-10-CM

## 2017-09-18 DIAGNOSIS — Z1211 Encounter for screening for malignant neoplasm of colon: Secondary | ICD-10-CM

## 2017-09-18 DIAGNOSIS — E119 Type 2 diabetes mellitus without complications: Secondary | ICD-10-CM | POA: Diagnosis not present

## 2017-09-18 DIAGNOSIS — K219 Gastro-esophageal reflux disease without esophagitis: Secondary | ICD-10-CM | POA: Diagnosis not present

## 2017-09-18 DIAGNOSIS — J4520 Mild intermittent asthma, uncomplicated: Secondary | ICD-10-CM

## 2017-09-18 DIAGNOSIS — H65192 Other acute nonsuppurative otitis media, left ear: Secondary | ICD-10-CM

## 2017-09-18 DIAGNOSIS — I10 Essential (primary) hypertension: Secondary | ICD-10-CM | POA: Diagnosis not present

## 2017-09-18 NOTE — Patient Instructions (Signed)
Afrin nasal spray - 2 sprays each nostril 2x/day - prior to your flight.  May use prior to flying back if needed.    Continue flonase  Robitussin DM - twice a day as needed for cough and congestion

## 2017-09-18 NOTE — Progress Notes (Signed)
Patient ID: Donna Hensley, female   DOB: [birth year], 52 y.o.   MRN: [id number]   Subjective:    Patient ID: Donna Hensley, female    DOB: Feb 23, 1966, 52 y.o.   MRN: [id number]  HPI  Patient here for a scheduled follow up.  She was recently evaluated and diagnosed with ear infection.  Treated with abx.  Took her last one last night.  Had some cough.  Overall is better.  No chest pain or tightness.  No sob.  No acid reflux.  No abdominal pain.  Bowels moving.  Has been exercising.  Working out with a Clinical research associate 2x/week.  Walking.  Doing yoga.  States am sugars averaging 120-130.  Taking metformin q day.  Agreeable for colonoscopy.     Past Medical History:  Diagnosis Date  . Allergy   . Asthma   . GERD (gastroesophageal reflux disease)   . Gestational diabetes    Past Surgical History:  Procedure Laterality Date  . APPENDECTOMY  1986  . [place]  . CHOLECYSTECTOMY  2013  . TONSILLECTOMY  1987   Family History  Problem Relation Age of Onset  . Hyperlipidemia Mother   . Osteoporosis Mother   . Hypertension Father   . Arthritis Father   . Sleep apnea Father   . Heart disease Father   . Sleep apnea Brother   . Arthritis Paternal Grandmother   . Diabetes Paternal Grandmother   . Breast cancer Neg Hx    Social History   Socioeconomic History  . Marital status: Married    Spouse name: Not on file  . Number of children: 2  . Years of education: Not on file  . Highest education level: Not on file  Occupational History  . 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 Financial resource strain: Not on file  . Food insecurity:    Worry: Not on file    Inability: Not on file  . Transportation needs:    Medical: Not on file    Non-medical: Not on file  Tobacco Use  . Smoking status: Former Smoker    Packs/day: 0.25    Years: 5.00    Pack years: 1.25    Types: Cigarettes    Last attempt to quit: 09/28/1988    Years since  quitting: 29.0  . Smokeless tobacco: Never Used  . Tobacco comment: smoked briefly as a teenager  Substance and Sexual Activity  . Alcohol use: Yes    Alcohol/week: 0.6 oz    Types: 1 Glasses of wine per week    Comment: Occasional 1-2 per month  . Drug use: No  . Sexual activity: Not on file  Lifestyle  . Physical activity:    Days per week: Not on file    Minutes per session: Not on file  . Stress: Not on file  Relationships  . Social connections:    Talks on phone: Not on file    Gets together: Not on file    Attends religious service: Not on file    Active member of club or organization: Not on file    Attends meetings of clubs or organizations: Not on file    Relationship status: Not on file  Other Topics Concern  . Not on file  Social History Narrative   Regular exercise-yes, walk   Diet: fast food, trying to improve diet          Outpatient  Encounter Medications as of 09/18/2017  Medication Sig  . albuterol (PROVENTIL HFA;VENTOLIN HFA) 108 (90 Base) MCG/ACT inhaler Inhale 2 puffs into the lungs every 6 (six) hours as needed for wheezing.  [name] amoxicillin-clavulanate (AUGMENTIN) 875-125 MG tablet Take 1 tablet by mouth 2 (two) times daily.  [name] BREO ELLIPTA 200-25 MCG/INH AEPB Inhale 1 puff into the lungs daily.   . Calcium-Magnesium-Vitamin D (CALCIUM 500 PO) Take 1 tablet by mouth daily.  . Cholecalciferol (VITAMIN D3) 1000 units CAPS Take 1 capsule by mouth daily.  . Cyanocobalamin (VITAMIN B 12 PO) Take 1 tablet by mouth daily.  . fluticasone (FLONASE) 50 MCG/ACT nasal spray USE 2 SPRAYS IN EACH NOSTRIL DAILY  . glucose blood test strip Use as instructed  . levocetirizine (XYZAL) 5 MG tablet Take 5 mg by mouth every evening.  [name] losartan (COZAAR) 50 MG tablet Take 1 tablet (50 mg total) by mouth daily.  . metFORMIN (GLUCOPHAGE) 500 MG tablet TAKE 1 TABLET DAILY  . montelukast (SINGULAIR) 10 MG tablet TAKE 1 TABLET DAILY AT BEDTIME  . Multiple Vitamin (MULTIVITAMIN)  tablet Take 1 tablet by mouth daily.  [name] omeprazole (PRILOSEC) 40 MG capsule TAKE 1 CAPSULE DAILY  . VITAMIN E PO Take by mouth daily.   No facility-administered encounter medications on file as of 09/18/2017.     Review of Systems  Constitutional: Negative for appetite change and unexpected weight change.  HENT: Negative for congestion and sinus pressure.        Ear is better.    Respiratory: Negative for chest tightness, shortness of breath and wheezing.        Some cough.  No chest tightness.    Cardiovascular: Negative for chest pain, palpitations and leg swelling.  Gastrointestinal: Negative for abdominal pain, diarrhea and nausea.  Genitourinary: Negative for difficulty urinating and dysuria.  Musculoskeletal: Negative for joint swelling and myalgias.  Skin: Negative for color change and rash.  Neurological: Negative for dizziness, light-headedness and headaches.  Psychiatric/Behavioral: Negative for agitation and dysphoric mood.       Objective:     Blood pressure rechecked by me:  128/78  Physical Exam  Constitutional: She appears well-developed and well-nourished. No distress.  HENT:  Nose: Nose normal.  Mouth/Throat: Oropharynx is clear and moist.  TMs without erythema.    Neck: Neck supple. No thyromegaly present.  Cardiovascular: Normal rate and regular rhythm.  Pulmonary/Chest: Breath sounds normal. No respiratory distress. She has no wheezes.  Abdominal: Soft. Bowel sounds are normal. There is no tenderness.  Musculoskeletal: She exhibits no edema or tenderness.  Lymphadenopathy:    She has no cervical adenopathy.  Skin: No rash noted. No erythema.  Psychiatric: She has a normal mood and affect. Her behavior is normal.    BP 132/78 (BP Location: Left Arm, Patient Position: Sitting, Cuff Size: Large)   Pulse 74   Temp 97.8 F (36.6 C) (Oral)   Resp 18   Wt 213 lb 3.2 oz (96.7 kg)   SpO2 98%   BMI 38.99 kg/m  Wt Readings from Last 3 Encounters:    09/18/17 213 lb 3.2 oz (96.7 kg)  09/10/17 211 lb (95.7 kg)  07/16/17 212 lb (96.2 kg)     Lab Results  Component Value Date   WBC 9.9 06/27/2017   HGB 14.0 06/27/2017   HCT 42.9 06/27/2017   PLT 308.0 06/27/2017   GLUCOSE 115 (H) 06/27/2017   CHOL 159 06/27/2017   TRIG 57.0 06/27/2017   HDL 63.20  06/27/2017   LDLCALC 84 06/27/2017   ALT 27 06/27/2017   AST 17 06/27/2017   NA 139 06/27/2017   K 4.0 06/27/2017   CL 103 06/27/2017   CREATININE 1.02 06/27/2017   BUN 14 06/27/2017   CO2 28 06/27/2017   TSH 1.61 06/27/2017   HGBA1C 7.3 (H) 06/27/2017   MICROALBUR 2.8 (H) 02/13/2016    Mm Screening Breast Tomo Bilateral  Result Date: 08/06/2017 CLINICAL DATA:  Screening. EXAM: DIGITAL SCREENING BILATERAL MAMMOGRAM WITH TOMO AND CAD COMPARISON:  Previous exam(s). ACR Breast Density Category b: There are scattered areas of fibroglandular density. FINDINGS: There are no findings suspicious for malignancy. Images were processed with CAD. IMPRESSION: No mammographic evidence of malignancy. A result letter of this screening mammogram will be mailed directly to the patient. RECOMMENDATION: Screening mammogram in one year. (Code:SM-B-01Y) BI-RADS CATEGORY  1: Negative. Electronically Signed   By: Nolon Nations M.D.   On: 08/06/2017 09:26       Assessment & Plan:   Problem List Items Addressed This Visit    Diabetes ([place])    Low carb diet and exercise.  Sugars as outlined.  Taking metformin daily.  Is exercising.  Follow.        Relevant Orders   Hemoglobin A1c   Hepatic function panel   Lipid panel   Basic metabolic panel   Microalbumin / creatinine urine ratio   Essential hypertension    Blood pressure under good control.  Continue same medication regimen.  Follow pressures.  Follow metabolic panel.        GERD    Controlled on omeprazole.        Iron deficiency    Follow cbc and ferritin.        Relevant Orders   CBC with Differential/Platelet   Ferritin   Left  otitis media    Doing better.  Completed abx.  Afrin nasal spray as directed.  Continue steroid nasal spray.  Follow.        Mild intermittent asthma in adult without complication    Doing well currently.  Follow.  Breathing stable.        Other Visit Diagnoses    Colon cancer screening       refer for colonscopy.     Relevant Orders   Ambulatory referral to Gastroenterology       Einar Pheasant, MD

## 2017-09-19 DIAGNOSIS — J3089 Other allergic rhinitis: Secondary | ICD-10-CM | POA: Diagnosis not present

## 2017-09-21 ENCOUNTER — Encounter: Payer: Self-pay | Admitting: Internal Medicine

## 2017-09-21 NOTE — Assessment & Plan Note (Signed)
Follow cbc and ferritin.  

## 2017-09-21 NOTE — Assessment & Plan Note (Signed)
Low carb diet and exercise.  Sugars as outlined.  Taking metformin daily.  Is exercising.  Follow.

## 2017-09-21 NOTE — Assessment & Plan Note (Signed)
Doing well currently.  Follow.  Breathing stable.

## 2017-09-21 NOTE — Assessment & Plan Note (Signed)
Controlled on omeprazole.   

## 2017-09-21 NOTE — Assessment & Plan Note (Signed)
Doing better.  Completed abx.  Afrin nasal spray as directed.  Continue steroid nasal spray.  Follow.

## 2017-09-21 NOTE — Assessment & Plan Note (Signed)
Blood pressure under good control.  Continue same medication regimen.  Follow pressures.  Follow metabolic panel.   

## 2017-09-26 DIAGNOSIS — J3089 Other allergic rhinitis: Secondary | ICD-10-CM | POA: Diagnosis not present

## 2017-09-26 DIAGNOSIS — J301 Allergic rhinitis due to pollen: Secondary | ICD-10-CM | POA: Diagnosis not present

## 2017-10-01 DIAGNOSIS — J209 Acute bronchitis, unspecified: Secondary | ICD-10-CM | POA: Diagnosis not present

## 2017-10-01 DIAGNOSIS — J3089 Other allergic rhinitis: Secondary | ICD-10-CM | POA: Diagnosis not present

## 2017-10-01 DIAGNOSIS — H1045 Other chronic allergic conjunctivitis: Secondary | ICD-10-CM | POA: Diagnosis not present

## 2017-10-01 DIAGNOSIS — J4530 Mild persistent asthma, uncomplicated: Secondary | ICD-10-CM | POA: Diagnosis not present

## 2017-10-04 ENCOUNTER — Encounter: Payer: Self-pay | Admitting: *Deleted

## 2017-10-10 DIAGNOSIS — J3089 Other allergic rhinitis: Secondary | ICD-10-CM | POA: Diagnosis not present

## 2017-10-14 DIAGNOSIS — J3089 Other allergic rhinitis: Secondary | ICD-10-CM | POA: Diagnosis not present

## 2017-10-17 DIAGNOSIS — J3089 Other allergic rhinitis: Secondary | ICD-10-CM | POA: Diagnosis not present

## 2017-10-22 DIAGNOSIS — J3089 Other allergic rhinitis: Secondary | ICD-10-CM | POA: Diagnosis not present

## 2017-10-24 DIAGNOSIS — J3089 Other allergic rhinitis: Secondary | ICD-10-CM | POA: Diagnosis not present

## 2017-10-29 DIAGNOSIS — J3089 Other allergic rhinitis: Secondary | ICD-10-CM | POA: Diagnosis not present

## 2017-11-05 DIAGNOSIS — J3089 Other allergic rhinitis: Secondary | ICD-10-CM | POA: Diagnosis not present

## 2017-11-08 DIAGNOSIS — J3089 Other allergic rhinitis: Secondary | ICD-10-CM | POA: Diagnosis not present

## 2017-11-12 DIAGNOSIS — J3089 Other allergic rhinitis: Secondary | ICD-10-CM | POA: Diagnosis not present

## 2017-11-21 DIAGNOSIS — J3089 Other allergic rhinitis: Secondary | ICD-10-CM | POA: Diagnosis not present

## 2017-11-26 DIAGNOSIS — J4530 Mild persistent asthma, uncomplicated: Secondary | ICD-10-CM | POA: Diagnosis not present

## 2017-11-26 DIAGNOSIS — J3089 Other allergic rhinitis: Secondary | ICD-10-CM | POA: Diagnosis not present

## 2017-11-26 DIAGNOSIS — H1045 Other chronic allergic conjunctivitis: Secondary | ICD-10-CM | POA: Diagnosis not present

## 2017-12-04 ENCOUNTER — Other Ambulatory Visit: Payer: BLUE CROSS/BLUE SHIELD

## 2017-12-05 ENCOUNTER — Other Ambulatory Visit (INDEPENDENT_AMBULATORY_CARE_PROVIDER_SITE_OTHER): Payer: BLUE CROSS/BLUE SHIELD

## 2017-12-05 DIAGNOSIS — E119 Type 2 diabetes mellitus without complications: Secondary | ICD-10-CM

## 2017-12-05 DIAGNOSIS — E611 Iron deficiency: Secondary | ICD-10-CM | POA: Diagnosis not present

## 2017-12-05 DIAGNOSIS — J3089 Other allergic rhinitis: Secondary | ICD-10-CM | POA: Diagnosis not present

## 2017-12-05 LAB — HEPATIC FUNCTION PANEL
ALT: 27 U/L (ref 0–35)
AST: 21 U/L (ref 0–37)
Albumin: 4 g/dL (ref 3.5–5.2)
Alkaline Phosphatase: 107 U/L (ref 39–117)
Bilirubin, Direct: 0.2 mg/dL (ref 0.0–0.3)
Total Bilirubin: 0.9 mg/dL (ref 0.2–1.2)
Total Protein: 7.4 g/dL (ref 6.0–8.3)

## 2017-12-05 LAB — LIPID PANEL
Cholesterol: 150 mg/dL (ref 0–200)
HDL: 56 mg/dL (ref >39.00)
LDL Cholesterol: 76 mg/dL (ref 0–99)
NonHDL: 93.97
Total CHOL/HDL Ratio: 3
Triglycerides: 88 mg/dL (ref 0.0–149.0)
VLDL: 17.6 mg/dL (ref 0.0–40.0)

## 2017-12-05 LAB — CBC WITH DIFFERENTIAL/PLATELET
Basophils Absolute: 0 10*3/uL (ref 0.0–0.1)
Basophils Relative: 0.4 % (ref 0.0–3.0)
Eosinophils Absolute: 0.2 10*3/uL (ref 0.0–0.7)
Eosinophils Relative: 2.2 % (ref 0.0–5.0)
HCT: 42.7 % (ref 36.0–46.0)
Hemoglobin: 13.9 g/dL (ref 12.0–15.0)
Lymphocytes Relative: 30.2 % (ref 12.0–46.0)
Lymphs Abs: 2.7 10*3/uL (ref 0.7–4.0)
MCHC: 32.6 g/dL (ref 30.0–36.0)
MCV: 82.3 fl (ref 78.0–100.0)
Monocytes Absolute: 0.5 10*3/uL (ref 0.1–1.0)
Monocytes Relative: 6.3 % (ref 3.0–12.0)
Neutro Abs: 5.3 10*3/uL (ref 1.4–7.7)
Neutrophils Relative %: 60.9 % (ref 43.0–77.0)
Platelets: 312 10*3/uL (ref 150.0–400.0)
RBC: 5.19 Mil/uL — ABNORMAL HIGH (ref 3.87–5.11)
RDW: 18.6 % — ABNORMAL HIGH (ref 11.5–15.5)
WBC: 8.8 10*3/uL (ref 4.0–10.5)

## 2017-12-05 LAB — BASIC METABOLIC PANEL
BUN: 11 mg/dL (ref 6–23)
CO2: 24 mEq/L (ref 19–32)
Calcium: 9.6 mg/dL (ref 8.4–10.5)
Chloride: 105 mEq/L (ref 96–112)
Creatinine, Ser: 0.88 mg/dL (ref 0.40–1.20)
GFR: 72.39 mL/min (ref >60.00)
Glucose, Bld: 143 mg/dL — ABNORMAL HIGH (ref 70–99)
Potassium: 3.9 mEq/L (ref 3.5–5.1)
Sodium: 139 mEq/L (ref 135–145)

## 2017-12-05 LAB — FERRITIN: Ferritin: 7.9 ng/mL — ABNORMAL LOW (ref 10.0–291.0)

## 2017-12-05 LAB — MICROALBUMIN / CREATININE URINE RATIO
Creatinine,U: 89.6 mg/dL
Microalb Creat Ratio: 1.9 mg/g (ref 0.0–30.0)
Microalb, Ur: 1.7 mg/dL (ref 0.0–1.9)

## 2017-12-05 LAB — HEMOGLOBIN A1C: Hgb A1c MFr Bld: 7 % — ABNORMAL HIGH (ref 4.6–6.5)

## 2017-12-06 ENCOUNTER — Other Ambulatory Visit: Payer: Self-pay

## 2017-12-06 DIAGNOSIS — J40 Bronchitis, not specified as acute or chronic: Secondary | ICD-10-CM

## 2017-12-06 MED ORDER — INTEGRA 62.5-62.5-40-3 MG PO CAPS: 1.0000 | ORAL_CAPSULE | Freq: Every day | ORAL | 0 refills | Status: DC

## 2017-12-09 ENCOUNTER — Other Ambulatory Visit: Payer: Self-pay

## 2017-12-09 ENCOUNTER — Encounter: Payer: Self-pay | Admitting: Internal Medicine

## 2017-12-09 MED ORDER — INTEGRA 62.5-62.5-40-3 MG PO CAPS: 1.0000 | ORAL_CAPSULE | Freq: Every day | ORAL | 0 refills | Status: DC

## 2017-12-12 DIAGNOSIS — J3089 Other allergic rhinitis: Secondary | ICD-10-CM | POA: Diagnosis not present

## 2017-12-14 ENCOUNTER — Other Ambulatory Visit: Payer: Self-pay | Admitting: Internal Medicine

## 2017-12-16 ENCOUNTER — Other Ambulatory Visit: Payer: Self-pay

## 2017-12-16 MED ORDER — METFORMIN HCL 500 MG PO TABS: 500.0000 mg | ORAL_TABLET | Freq: Two times a day (BID) | ORAL | 1 refills | Status: DC

## 2017-12-25 ENCOUNTER — Other Ambulatory Visit: Payer: Self-pay | Admitting: Internal Medicine

## 2017-12-26 DIAGNOSIS — J3089 Other allergic rhinitis: Secondary | ICD-10-CM | POA: Diagnosis not present

## 2018-01-02 DIAGNOSIS — J3089 Other allergic rhinitis: Secondary | ICD-10-CM | POA: Diagnosis not present

## 2018-01-09 DIAGNOSIS — J3089 Other allergic rhinitis: Secondary | ICD-10-CM | POA: Diagnosis not present

## 2018-01-10 ENCOUNTER — Encounter: Payer: Self-pay | Admitting: Internal Medicine

## 2018-01-10 ENCOUNTER — Ambulatory Visit: Payer: BLUE CROSS/BLUE SHIELD | Admitting: Internal Medicine

## 2018-01-10 DIAGNOSIS — E611 Iron deficiency: Secondary | ICD-10-CM

## 2018-01-10 DIAGNOSIS — I10 Essential (primary) hypertension: Secondary | ICD-10-CM | POA: Diagnosis not present

## 2018-01-10 DIAGNOSIS — E119 Type 2 diabetes mellitus without complications: Secondary | ICD-10-CM | POA: Diagnosis not present

## 2018-01-10 DIAGNOSIS — Z23 Encounter for immunization: Secondary | ICD-10-CM

## 2018-01-10 DIAGNOSIS — K219 Gastro-esophageal reflux disease without esophagitis: Secondary | ICD-10-CM

## 2018-01-10 DIAGNOSIS — J4520 Mild intermittent asthma, uncomplicated: Secondary | ICD-10-CM

## 2018-01-10 NOTE — Progress Notes (Signed)
Patient ID: Donna Hensley, female   DOB: March 05, 1966, 52 y.o.   MRN: [id number]   Subjective:    Patient ID: Donna Hensley, female    DOB: 02-Oct-1965, 52 y.o.   MRN: [id number]  HPI  Patient here for a scheduled follow up.  She reports she is doing relatively well.  Some increased stress.  Her daughter is moving to [place] for a few years.  Overall she feels she is handling things relatively well.  Trying to stay active.  Working out with a Clinical research associate 2x/week.  She is exercising.  Watching her diet.  Taking metformin bid now.  No chest pain.  No sob.  Breathing stable.  No increased cough or congestion.  No abdominal pain.  Bowels moving.  Is iron deficient.  Discussed further w/up and GI referral.  On losartan 18m now.    Past Medical History:  Diagnosis Date  . Allergy   . Asthma   . GERD (gastroesophageal reflux disease)   . Gestational diabetes    Past Surgical History:  Procedure Laterality Date  . [place]OMY  1986  [place] . CHOLECYSTECTOMY  2013  . TONSILLECTOMY  1987   Family History  Problem Relation Age of Onset  . Hyperlipidemia Mother   . Osteoporosis Mother   . Hypertension Father   . Arthritis Father   . Sleep apnea Father   . Heart disease Father   . Sleep apnea Brother   . Arthritis Paternal Grandmother   . Diabetes Paternal Grandmother   . Breast cancer Neg Hx    Social History   Socioeconomic History  . Marital status: Married    Spouse name: Not on file  . Number of children: 2  . Years of education: Not on file  . Highest education level: Not on file  Occupational History  . Occupation: cRetail banker [employer] Social Needs  . Financial resource strain: Not on file  . Food insecurity:    Worry: Not on file    Inability: Not on file  . Transportation needs:    Medical: Not on file    Non-medical: Not on file  Tobacco Use  . Smoking status: Former Smoker    Packs/day: 0.25    Years: 5.00   Pack years: 1.25    Types: Cigarettes    Last attempt to quit: 09/28/1988    Years since quitting: 29.3  . Smokeless tobacco: Never Used  . Tobacco comment: smoked briefly as a teenager  Substance and Sexual Activity  . Alcohol use: Yes    Alcohol/week: 1.0 standard drinks    Types: 1 Glasses of wine per week    Comment: Occasional 1-2 per month  . Drug use: No  . Sexual activity: Not on file  Lifestyle  . Physical activity:    Days per week: Not on file    Minutes per session: Not on file  . Stress: Not on file  Relationships  . Social connections:    Talks on phone: Not on file    Gets together: Not on file    Attends religious service: Not on file    Active member of club or organization: Not on file    Attends meetings of clubs or organizations: Not on file    Relationship status: Not on file  Other Topics Concern  . Not on file  Social History Narrative   Regular exercise-yes, walk   Diet: fast  food, trying to improve diet          Outpatient Encounter Medications as of 01/10/2018  Medication Sig  . albuterol (PROVENTIL HFA;VENTOLIN HFA) 108 (90 Base) MCG/ACT inhaler Inhale 2 puffs into the lungs every 6 (six) hours as needed for wheezing.  [name] amoxicillin-clavulanate (AUGMENTIN) 875-125 MG tablet Take 1 tablet by mouth 2 (two) times daily.  [name] BREO ELLIPTA 200-25 MCG/INH AEPB Inhale 1 puff into the lungs daily.   . Calcium-Magnesium-Vitamin D (CALCIUM 500 PO) Take 1 tablet by mouth daily.  . Cholecalciferol (VITAMIN D3) 1000 units CAPS Take 1 capsule by mouth daily.  . Cyanocobalamin (VITAMIN B 12 PO) Take 1 tablet by mouth daily.  . Fe Fum-FePoly-Vit C-Vit B3 (INTEGRA) 62.5-62.5-40-3 MG CAPS Take 1 capsule by mouth daily.  . fluticasone (FLONASE) 50 MCG/ACT nasal spray USE 2 SPRAYS IN EACH NOSTRIL DAILY  . glucose blood test strip Use as instructed  . levocetirizine (XYZAL) 5 MG tablet Take 5 mg by mouth every evening.  [name] losartan (COZAAR) 50 MG tablet TAKE 1 TABLET  DAILY  . metFORMIN (GLUCOPHAGE) 500 MG tablet Take 1 tablet (500 mg total) by mouth 2 (two) times daily.  . montelukast (SINGULAIR) 10 MG tablet TAKE 1 TABLET DAILY AT BEDTIME  . Multiple Vitamin (MULTIVITAMIN) tablet Take 1 tablet by mouth daily.  [name] omeprazole (PRILOSEC) 40 MG capsule TAKE 1 CAPSULE DAILY  . VITAMIN E PO Take by mouth daily.   No facility-administered encounter medications on file as of 01/10/2018.     Review of Systems  Constitutional: Negative for appetite change and unexpected weight change.  HENT: Negative for congestion and sinus pressure.   Respiratory: Negative for cough, chest tightness and shortness of breath.   Cardiovascular: Negative for chest pain, palpitations and leg swelling.  Gastrointestinal: Negative for abdominal pain, diarrhea, nausea and vomiting.  Genitourinary: Negative for difficulty urinating and dysuria.  Musculoskeletal: Negative for joint swelling and myalgias.  Skin: Negative for color change and rash.  Neurological: Negative for dizziness, light-headedness and headaches.  Psychiatric/Behavioral: Negative for agitation and dysphoric mood.       Objective:    Physical Exam  Constitutional: She appears well-developed and well-nourished. No distress.  HENT:  Nose: Nose normal.  Mouth/Throat: Oropharynx is clear and moist.  Neck: Neck supple. No thyromegaly present.  Cardiovascular: Normal rate and regular rhythm.  Pulmonary/Chest: Breath sounds normal. No respiratory distress. She has no wheezes.  Abdominal: Soft. Bowel sounds are normal. There is no tenderness.  Musculoskeletal: She exhibits no edema or tenderness.  Lymphadenopathy:    She has no cervical adenopathy.  Skin: No rash noted. No erythema.  Psychiatric: She has a normal mood and affect. Her behavior is normal.    BP 138/88 (BP Location: Left Arm, Patient Position: Sitting, Cuff Size: Large)   Pulse 78   Temp 97.7 F (36.5 C) (Oral)   Resp 18   Wt 210 lb 6.4 oz  (95.4 kg)   SpO2 98%   BMI 38.48 kg/m  Wt Readings from Last 3 Encounters:  01/10/18 210 lb 6.4 oz (95.4 kg)  09/18/17 213 lb 3.2 oz (96.7 kg)  09/10/17 211 lb (95.7 kg)     Lab Results  Component Value Date   WBC 8.8 12/05/2017   HGB 13.9 12/05/2017   HCT 42.7 12/05/2017   PLT 312.0 12/05/2017   GLUCOSE 143 (H) 12/05/2017   CHOL 150 12/05/2017   TRIG 88.0 12/05/2017   HDL 56.00 12/05/2017   LDLCALC  76 12/05/2017   ALT 27 12/05/2017   AST 21 12/05/2017   NA 139 12/05/2017   K 3.9 12/05/2017   CL 105 12/05/2017   CREATININE 0.88 12/05/2017   BUN 11 12/05/2017   CO2 24 12/05/2017   TSH 1.61 06/27/2017   HGBA1C 7.0 (H) 12/05/2017   MICROALBUR 1.7 12/05/2017    Mm Screening Breast Tomo Bilateral  Result Date: 08/06/2017 CLINICAL DATA:  Screening. EXAM: DIGITAL SCREENING BILATERAL MAMMOGRAM WITH TOMO AND CAD COMPARISON:  Previous exam(s). ACR Breast Density Category b: There are scattered areas of fibroglandular density. FINDINGS: There are no findings suspicious for malignancy. Images were processed with CAD. IMPRESSION: No mammographic evidence of malignancy. A result letter of this screening mammogram will be mailed directly to the patient. RECOMMENDATION: Screening mammogram in one year. (Code:SM-B-01Y) BI-RADS CATEGORY  1: Negative. Electronically Signed   By: Nolon Nations M.D.   On: 08/06/2017 09:26       Assessment & Plan:   Problem List Items Addressed This Visit    Diabetes ([place])    On metformin bid now.  Is exercising.  Adjusting her diet.  Discussed diet and exercise.  Follow met b and a1c.        Relevant Orders   Hemoglobin A1c   Hepatic function panel   Lipid panel   Essential hypertension    Blood pressure as outlined.  On losartan 6m q day.  Follow pressures.  Follow metabolic panel.        Relevant Orders   Basic metabolic panel   GERD    On omeprazole.  Controlled.  With iron deficiency.  Refer to GI for further evaluation and w/up.          Iron deficiency    With iron deficiency, needs further GI evaluation.  On omeprazole.  Refer to GI for further w/up.        Relevant Orders   Ambulatory referral to Gastroenterology   Ferritin   CBC with Differential/Platelet   Mild intermittent asthma in adult without complication    Doing well on current regimen.  Follow.         Other Visit Diagnoses    Need for influenza vaccination       Relevant Orders   Flu Vaccine QUAD 6+ mos PF IM (Fluarix Quad PF) (Completed)       CEinar Pheasant MD

## 2018-01-13 ENCOUNTER — Encounter: Payer: Self-pay | Admitting: Internal Medicine

## 2018-01-13 NOTE — Assessment & Plan Note (Signed)
Doing well on current regimen.  Follow.   

## 2018-01-13 NOTE — Assessment & Plan Note (Signed)
On omeprazole.  Controlled.  With iron deficiency.  Refer to GI for further evaluation and w/up.

## 2018-01-13 NOTE — Assessment & Plan Note (Signed)
On metformin bid now.  Is exercising.  Adjusting her diet.  Discussed diet and exercise.  Follow met b and a1c.

## 2018-01-13 NOTE — Assessment & Plan Note (Signed)
With iron deficiency, needs further GI evaluation.  On omeprazole.  Refer to GI for further w/up.

## 2018-01-13 NOTE — Assessment & Plan Note (Signed)
Blood pressure as outlined.  On losartan 50mg q day. Follow pressures.  Follow metabolic panel.  ?

## 2018-01-15 ENCOUNTER — Telehealth: Payer: Self-pay

## 2018-01-15 NOTE — Telephone Encounter (Signed)
Patient contacted office stated she received a call for an appt for colonoscopy and EGD.  Reviewed chart and we received a referral in May for colonoscopy.  New referral from 09/16 was sent to Dr. Gustavo Lah for Iron Def Anemia. I informed patient that we could see her in the office or she could go to see Dr. Gustavo Lah.  She stated she will call Dr. Lars Mage office to see what she advised.  Thanks [employer]

## 2018-01-16 ENCOUNTER — Encounter: Payer: Self-pay | Admitting: Internal Medicine

## 2018-01-16 DIAGNOSIS — J3089 Other allergic rhinitis: Secondary | ICD-10-CM | POA: Diagnosis not present

## 2018-01-16 NOTE — Telephone Encounter (Signed)
I just received this form to sign.  Signed and placed in box.  The waist circumference needs to be completed.  Forms states all sections must be completed.  Also, there is a part that she has to sign.

## 2018-01-16 NOTE — Telephone Encounter (Signed)
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

## 2018-01-17 NOTE — Telephone Encounter (Signed)
Pt picked up form. Made a copy and sent to scan

## 2018-01-30 DIAGNOSIS — H1045 Other chronic allergic conjunctivitis: Secondary | ICD-10-CM | POA: Diagnosis not present

## 2018-01-30 DIAGNOSIS — J209 Acute bronchitis, unspecified: Secondary | ICD-10-CM | POA: Diagnosis not present

## 2018-01-30 DIAGNOSIS — J3089 Other allergic rhinitis: Secondary | ICD-10-CM | POA: Diagnosis not present

## 2018-01-30 DIAGNOSIS — J4530 Mild persistent asthma, uncomplicated: Secondary | ICD-10-CM | POA: Diagnosis not present

## 2018-02-04 DIAGNOSIS — R1310 Dysphagia, unspecified: Secondary | ICD-10-CM | POA: Diagnosis not present

## 2018-02-04 DIAGNOSIS — Z8371 Family history of colonic polyps: Secondary | ICD-10-CM | POA: Diagnosis not present

## 2018-02-04 DIAGNOSIS — K219 Gastro-esophageal reflux disease without esophagitis: Secondary | ICD-10-CM | POA: Diagnosis not present

## 2018-02-06 DIAGNOSIS — J3089 Other allergic rhinitis: Secondary | ICD-10-CM | POA: Diagnosis not present

## 2018-02-11 DIAGNOSIS — J3089 Other allergic rhinitis: Secondary | ICD-10-CM | POA: Diagnosis not present

## 2018-02-20 DIAGNOSIS — J3089 Other allergic rhinitis: Secondary | ICD-10-CM | POA: Diagnosis not present

## 2018-02-28 DIAGNOSIS — J3089 Other allergic rhinitis: Secondary | ICD-10-CM | POA: Diagnosis not present

## 2018-03-01 ENCOUNTER — Other Ambulatory Visit: Payer: Self-pay | Admitting: Internal Medicine

## 2018-03-13 DIAGNOSIS — J3089 Other allergic rhinitis: Secondary | ICD-10-CM | POA: Diagnosis not present

## 2018-03-20 DIAGNOSIS — J3089 Other allergic rhinitis: Secondary | ICD-10-CM | POA: Diagnosis not present

## 2018-03-25 ENCOUNTER — Encounter: Payer: Self-pay | Admitting: Family Medicine

## 2018-03-25 ENCOUNTER — Ambulatory Visit: Payer: BLUE CROSS/BLUE SHIELD | Admitting: Family Medicine

## 2018-03-25 VITALS — BP 126/88 | HR 76 | Temp 98.0°F | Ht 62.0 in | Wt 209.0 lb

## 2018-03-25 DIAGNOSIS — H1033 Unspecified acute conjunctivitis, bilateral: Secondary | ICD-10-CM

## 2018-03-25 MED ORDER — POLYMYXIN B-TRIMETHOPRIM 10000-0.1 UNIT/ML-% OP SOLN: 1.0000 [drp] | OPHTHALMIC | 0 refills | Status: DC

## 2018-03-25 NOTE — Progress Notes (Signed)
Subjective:    Patient ID: Donna Hensley, female    DOB: [birth year], 52 y.o.   MRN: [id number]  HPI   Patient presents to clinic complaining of itchy, runny, crusting and discharge from both eyes.  Patient states is been present for a few days.  Patient states at first she thought maybe was allergies, use her allergy eyedrops but this did not help her symptoms at all.  Patient states when she woke up this morning her eyes were matted shut, she was able to wipe off crusting and drainage with a wet rag.  Denies any vision changes.   Patient Active Problem List   Diagnosis Date Noted  . Left otitis media 06/23/2015  . Essential hypertension 04/28/2015  . Respiratory tract infection 04/18/2015  . Health care maintenance 02/05/2015  . Diabetes ([place]) 09/06/2013  . Essential hypertension, benign 11/15/2012  . Iron deficiency 10/05/2012  . Mild intermittent asthma in adult without complication 58/12/9831  . HYPERTHYROIDISM, SUBCLINICAL 08/24/2008  . Allergic rhinitis 08/24/2008  . GERD 08/24/2008   Social History   Tobacco Use  . Smoking status: Former Smoker    Packs/day: 0.25    Years: 5.00    Pack years: 1.25    Types: Cigarettes    Last attempt to quit: 09/28/1988    Years since quitting: 29.5  . Smokeless tobacco: Never Used  . Tobacco comment: smoked briefly as a teenager  Substance Use Topics  . Alcohol use: Yes    Alcohol/week: 1.0 standard drinks    Types: 1 Glasses of wine per week    Comment: Occasional 1-2 per month   Review of Systems  Constitutional: Negative for chills, fatigue and fever.  HENT: Negative for congestion, ear pain, sinus pain and sore throat.   Eyes: +redness, crusting, drainage Respiratory: Negative for cough, shortness of breath and wheezing.   Cardiovascular: Negative for chest pain, palpitations and leg swelling.  Gastrointestinal: Negative for abdominal pain, diarrhea, nausea and vomiting.  Genitourinary: Negative for dysuria, frequency  and urgency.  Musculoskeletal: Negative for arthralgias and myalgias.  Skin: Negative for color change, pallor and rash.  Neurological: Negative for syncope, light-headedness and headaches.  Psychiatric/Behavioral: The patient is not nervous/anxious.       Objective:   Physical Exam  Constitutional: She is oriented to person, place, and time. She appears well-nourished. No distress.  HENT:  Head: Normocephalic and atraumatic.  Eyes: Pupils are equal, round, and reactive to light. EOM are normal. Right eye exhibits discharge. Left eye exhibits discharge. Right conjunctiva is injected. Left conjunctiva is injected. No scleral icterus.  Tan crusting along lash line  Neck: Neck supple. No tracheal deviation present.  Cardiovascular: Normal rate and regular rhythm.  Pulmonary/Chest: Effort normal and breath sounds normal. No respiratory distress.  Neurological: She is alert and oriented to person, place, and time.  Skin: Skin is warm and dry. No pallor.  Psychiatric: She has a normal mood and affect. Her behavior is normal.  Nursing note and vitals reviewed.     Vitals:   03/25/18 1030  BP: 126/88  Pulse: 76  Temp: 98 F (36.7 C)  SpO2: 92%   Assessment & Plan:   Acute bacterial conjunctivitis of both eyes - patient will use Polytrim eyedrops every 4 hours.  Advised to dispose of any recently use eye make-up, advised to change pillowcase, change any recently used towels.  Patient also advised to do good handwashing and sanitize any commonly use services.  Patient advised to not  wear any contacts, wear glasses only for next week to 10 days.   Keep regularly scheduled follow-up with PCP as planned.  Return to clinic sooner if any issues arise or current symptoms persist or worsen.

## 2018-04-01 DIAGNOSIS — J3089 Other allergic rhinitis: Secondary | ICD-10-CM | POA: Diagnosis not present

## 2018-04-03 DIAGNOSIS — J3089 Other allergic rhinitis: Secondary | ICD-10-CM | POA: Diagnosis not present

## 2018-04-10 DIAGNOSIS — J3089 Other allergic rhinitis: Secondary | ICD-10-CM | POA: Diagnosis not present

## 2018-04-17 DIAGNOSIS — J3089 Other allergic rhinitis: Secondary | ICD-10-CM | POA: Diagnosis not present

## 2018-04-22 DIAGNOSIS — J3089 Other allergic rhinitis: Secondary | ICD-10-CM | POA: Diagnosis not present

## 2018-05-01 DIAGNOSIS — J3089 Other allergic rhinitis: Secondary | ICD-10-CM | POA: Diagnosis not present

## 2018-05-02 ENCOUNTER — Other Ambulatory Visit (INDEPENDENT_AMBULATORY_CARE_PROVIDER_SITE_OTHER): Payer: BLUE CROSS/BLUE SHIELD

## 2018-05-02 ENCOUNTER — Encounter: Payer: Self-pay | Admitting: *Deleted

## 2018-05-02 DIAGNOSIS — E611 Iron deficiency: Secondary | ICD-10-CM

## 2018-05-02 DIAGNOSIS — E119 Type 2 diabetes mellitus without complications: Secondary | ICD-10-CM

## 2018-05-02 DIAGNOSIS — I10 Essential (primary) hypertension: Secondary | ICD-10-CM

## 2018-05-02 LAB — CBC WITH DIFFERENTIAL/PLATELET
Basophils Absolute: 0.1 10*3/uL (ref 0.0–0.1)
Basophils Relative: 1 % (ref 0.0–3.0)
Eosinophils Absolute: 0.3 10*3/uL (ref 0.0–0.7)
Eosinophils Relative: 4.1 % (ref 0.0–5.0)
HCT: 44.8 % (ref 36.0–46.0)
Hemoglobin: 14.9 g/dL (ref 12.0–15.0)
Lymphocytes Relative: 29.7 % (ref 12.0–46.0)
Lymphs Abs: 2.5 10*3/uL (ref 0.7–4.0)
MCHC: 33.4 g/dL (ref 30.0–36.0)
MCV: 85.8 fl (ref 78.0–100.0)
Monocytes Absolute: 0.6 10*3/uL (ref 0.1–1.0)
Monocytes Relative: 6.9 % (ref 3.0–12.0)
Neutro Abs: 4.9 10*3/uL (ref 1.4–7.7)
Neutrophils Relative %: 58.3 % (ref 43.0–77.0)
Platelets: 316 10*3/uL (ref 150.0–400.0)
RBC: 5.21 Mil/uL — ABNORMAL HIGH (ref 3.87–5.11)
RDW: 15.1 % (ref 11.5–15.5)
WBC: 8.5 10*3/uL (ref 4.0–10.5)

## 2018-05-02 LAB — HEPATIC FUNCTION PANEL
ALT: 33 U/L (ref 0–35)
AST: 25 U/L (ref 0–37)
Albumin: 4.1 g/dL (ref 3.5–5.2)
Alkaline Phosphatase: 97 U/L (ref 39–117)
Bilirubin, Direct: 0.2 mg/dL (ref 0.0–0.3)
Total Bilirubin: 0.6 mg/dL (ref 0.2–1.2)
Total Protein: 6.8 g/dL (ref 6.0–8.3)

## 2018-05-02 LAB — LIPID PANEL
Cholesterol: 160 mg/dL (ref 0–200)
HDL: 56.5 mg/dL (ref >39.00)
LDL Cholesterol: 91 mg/dL (ref 0–99)
NonHDL: 103.52
Total CHOL/HDL Ratio: 3
Triglycerides: 63 mg/dL (ref 0.0–149.0)
VLDL: 12.6 mg/dL (ref 0.0–40.0)

## 2018-05-02 LAB — BASIC METABOLIC PANEL
BUN: 13 mg/dL (ref 6–23)
CO2: 24 mEq/L (ref 19–32)
Calcium: 9.7 mg/dL (ref 8.4–10.5)
Chloride: 105 mEq/L (ref 96–112)
Creatinine, Ser: 0.87 mg/dL (ref 0.40–1.20)
GFR: 73.23 mL/min (ref >60.00)
Glucose, Bld: 122 mg/dL — ABNORMAL HIGH (ref 70–99)
Potassium: 4.1 mEq/L (ref 3.5–5.1)
Sodium: 139 mEq/L (ref 135–145)

## 2018-05-02 LAB — HEMOGLOBIN A1C: Hgb A1c MFr Bld: 6.2 % (ref 4.6–6.5)

## 2018-05-02 LAB — FERRITIN: Ferritin: 11.1 ng/mL (ref 10.0–291.0)

## 2018-05-03 ENCOUNTER — Encounter: Payer: Self-pay | Admitting: Anesthesiology

## 2018-05-04 ENCOUNTER — Encounter: Payer: Self-pay | Admitting: Internal Medicine

## 2018-05-05 ENCOUNTER — Ambulatory Visit: Payer: BLUE CROSS/BLUE SHIELD | Admitting: Anesthesiology

## 2018-05-05 ENCOUNTER — Ambulatory Visit
Admission: RE | Admit: 2018-05-05 | Discharge: 2018-05-05 | Disposition: A | Discharge status: Home / Self Care | Payer: BLUE CROSS/BLUE SHIELD | Attending: Unknown Physician Specialty | Admitting: Unknown Physician Specialty

## 2018-05-05 ENCOUNTER — Encounter
Admission: RE | Disposition: A | Discharge status: Home / Self Care | Payer: Self-pay | Source: Home / Self Care | Attending: Unknown Physician Specialty

## 2018-05-05 ENCOUNTER — Encounter: Payer: Self-pay | Admitting: Anesthesiology

## 2018-05-05 DIAGNOSIS — I10 Essential (primary) hypertension: Secondary | ICD-10-CM | POA: Diagnosis not present

## 2018-05-05 DIAGNOSIS — K640 First degree hemorrhoids: Secondary | ICD-10-CM | POA: Diagnosis not present

## 2018-05-05 DIAGNOSIS — D128 Benign neoplasm of rectum: Secondary | ICD-10-CM | POA: Diagnosis not present

## 2018-05-05 DIAGNOSIS — Z79899 Other long term (current) drug therapy: Secondary | ICD-10-CM | POA: Diagnosis not present

## 2018-05-05 DIAGNOSIS — Z87891 Personal history of nicotine dependence: Secondary | ICD-10-CM | POA: Insufficient documentation

## 2018-05-05 DIAGNOSIS — J45909 Unspecified asthma, uncomplicated: Secondary | ICD-10-CM | POA: Insufficient documentation

## 2018-05-05 DIAGNOSIS — K3189 Other diseases of stomach and duodenum: Secondary | ICD-10-CM | POA: Diagnosis not present

## 2018-05-05 DIAGNOSIS — D509 Iron deficiency anemia, unspecified: Secondary | ICD-10-CM | POA: Insufficient documentation

## 2018-05-05 DIAGNOSIS — D122 Benign neoplasm of ascending colon: Secondary | ICD-10-CM | POA: Insufficient documentation

## 2018-05-05 DIAGNOSIS — D120 Benign neoplasm of cecum: Secondary | ICD-10-CM | POA: Diagnosis not present

## 2018-05-05 DIAGNOSIS — Z1211 Encounter for screening for malignant neoplasm of colon: Secondary | ICD-10-CM | POA: Diagnosis not present

## 2018-05-05 DIAGNOSIS — Z888 Allergy status to other drugs, medicaments and biological substances status: Secondary | ICD-10-CM | POA: Insufficient documentation

## 2018-05-05 DIAGNOSIS — Z7984 Long term (current) use of oral hypoglycemic drugs: Secondary | ICD-10-CM | POA: Diagnosis not present

## 2018-05-05 DIAGNOSIS — K219 Gastro-esophageal reflux disease without esophagitis: Secondary | ICD-10-CM | POA: Diagnosis not present

## 2018-05-05 DIAGNOSIS — K635 Polyp of colon: Secondary | ICD-10-CM | POA: Insufficient documentation

## 2018-05-05 DIAGNOSIS — K2960 Other gastritis without bleeding: Secondary | ICD-10-CM | POA: Diagnosis not present

## 2018-05-05 DIAGNOSIS — K2950 Unspecified chronic gastritis without bleeding: Secondary | ICD-10-CM | POA: Insufficient documentation

## 2018-05-05 DIAGNOSIS — D126 Benign neoplasm of colon, unspecified: Secondary | ICD-10-CM | POA: Diagnosis not present

## 2018-05-05 DIAGNOSIS — Z885 Allergy status to narcotic agent status: Secondary | ICD-10-CM | POA: Diagnosis not present

## 2018-05-05 DIAGNOSIS — Z8371 Family history of colonic polyps: Secondary | ICD-10-CM | POA: Diagnosis not present

## 2018-05-05 DIAGNOSIS — D123 Benign neoplasm of transverse colon: Secondary | ICD-10-CM | POA: Diagnosis not present

## 2018-05-05 DIAGNOSIS — K2970 Gastritis, unspecified, without bleeding: Secondary | ICD-10-CM | POA: Diagnosis not present

## 2018-05-05 DIAGNOSIS — Z881 Allergy status to other antibiotic agents status: Secondary | ICD-10-CM | POA: Insufficient documentation

## 2018-05-05 HISTORY — PX: COLONOSCOPY WITH PROPOFOL: SHX5780

## 2018-05-05 HISTORY — DX: Essential (primary) hypertension: I10

## 2018-05-05 HISTORY — PX: ESOPHAGOGASTRODUODENOSCOPY (EGD) WITH PROPOFOL: SHX5813

## 2018-05-05 LAB — GLUCOSE, CAPILLARY: Glucose-Capillary: 137 mg/dL — ABNORMAL HIGH (ref 70–99)

## 2018-05-05 LAB — POCT PREGNANCY, URINE: Preg Test, Ur: NEGATIVE

## 2018-05-05 LAB — HM COLONOSCOPY

## 2018-05-05 SURGERY — COLONOSCOPY WITH PROPOFOL
Anesthesia: General

## 2018-05-05 MED ORDER — LIDOCAINE HCL (PF) 2 % IJ SOLN
INTRAMUSCULAR | Status: AC
  Filled 2018-05-05: qty 10

## 2018-05-05 MED ORDER — PROPOFOL 500 MG/50ML IV EMUL
INTRAVENOUS | Status: AC
  Filled 2018-05-05: qty 50

## 2018-05-05 MED ORDER — LIDOCAINE HCL (CARDIAC) PF 100 MG/5ML IV SOSY
PREFILLED_SYRINGE | INTRAVENOUS | Status: DC | PRN
  Administered 2018-05-05: 30 mg via INTRAVENOUS

## 2018-05-05 MED ORDER — MIDAZOLAM HCL 2 MG/2ML IJ SOLN
INTRAMUSCULAR | Status: AC
  Filled 2018-05-05: qty 2

## 2018-05-05 MED ORDER — SODIUM CHLORIDE 0.9 % IV SOLN
INTRAVENOUS | Status: DC
  Administered 2018-05-05: 1000 mL via INTRAVENOUS

## 2018-05-05 MED ORDER — FENTANYL CITRATE (PF) 100 MCG/2ML IJ SOLN
INTRAMUSCULAR | Status: DC | PRN
  Administered 2018-05-05: 50 ug via INTRAVENOUS
  Administered 2018-05-05 (×2): 25 ug via INTRAVENOUS

## 2018-05-05 MED ORDER — PROPOFOL 500 MG/50ML IV EMUL
INTRAVENOUS | Status: DC | PRN
  Administered 2018-05-05: 100 ug/kg/min via INTRAVENOUS

## 2018-05-05 MED ORDER — FENTANYL CITRATE (PF) 100 MCG/2ML IJ SOLN
INTRAMUSCULAR | Status: AC
  Filled 2018-05-05: qty 2

## 2018-05-05 MED ORDER — MIDAZOLAM HCL 2 MG/2ML IJ SOLN
INTRAMUSCULAR | Status: DC | PRN
  Administered 2018-05-05: 2 mg via INTRAVENOUS

## 2018-05-05 MED ORDER — SODIUM CHLORIDE 0.9 % IV SOLN: INTRAVENOUS | Status: DC

## 2018-05-05 NOTE — H&P (Signed)
Primary Care Physician:  Einar Pheasant, MD Primary Gastroenterologist:  Dr. Vira Agar  Pre-Procedure History & Physical: HPI:  Donna Hensley is a 53 y.o. female is here for an endoscopy and colonoscopy.   Past Medical History:  Diagnosis Date  . Allergy   . Asthma   . GERD (gastroesophageal reflux disease)   . Gestational diabetes   . Hypertension     Past Surgical History:  Procedure Laterality Date  . APPENDECTOMY  1986  . [place]  . CHOLECYSTECTOMY  2013  . TONSILLECTOMY  1987  . wisdom tooth removal      Prior to Admission medications   Medication Sig Start Date End Date Taking? Authorizing Provider  BREO ELLIPTA 200-25 MCG/INH AEPB Inhale 1 puff into the lungs daily.  12/29/15  Yes [provider]  Calcium-Magnesium-Vitamin D (CALCIUM 500 PO) Take 1 tablet by mouth daily.   Yes [provider]  Cholecalciferol (VITAMIN D3) 1000 units CAPS Take 1 capsule by mouth daily.   Yes [provider]  Cyanocobalamin (VITAMIN B 12 PO) Take 1 tablet by mouth daily.   Yes [provider]  Fe Fum-FePoly-Vit C-Vit B3 (INTEGRA) 62.5-62.5-40-3 MG CAPS Take 1 capsule by mouth daily. 12/09/17  Yes Einar Pheasant, MD  fluticasone [facility]) 50 MCG/ACT nasal spray USE 2 SPRAYS IN [facility] NOSTRIL DAILY 10/18/16  Yes Einar Pheasant, MD  glucose blood test strip Use as instructed 12/19/16  Yes Einar Pheasant, MD  levocetirizine (XYZAL) 5 MG tablet Take 5 mg by mouth every evening. 02/08/16  Yes [provider]  losartan (COZAAR) 50 MG tablet TAKE 1 TABLET DAILY 12/25/17  Yes Einar Pheasant, MD  metFORMIN (GLUCOPHAGE) 500 MG tablet Take 1 tablet (500 mg total) by mouth 2 (two) times daily. 12/16/17  Yes Einar Pheasant, MD  montelukast (SINGULAIR) 10 MG tablet TAKE 1 TABLET DAILY AT BEDTIME 07/31/17  Yes Einar Pheasant, MD  omeprazole (PRILOSEC) 40 MG capsule TAKE 1 CAPSULE DAILY 03/03/18  Yes Einar Pheasant, MD  VITAMIN E PO  Take by mouth daily.   Yes [provider]  albuterol (PROVENTIL HFA;VENTOLIN HFA) 108 (90 Base) MCG/ACT inhaler Inhale 2 puffs into the lungs every 6 (six) hours as needed for wheezing. 05/30/16   Burnard Hawthorne, FNP  amoxicillin-clavulanate (AUGMENTIN) 875-125 MG tablet Take 1 tablet by mouth 2 (two) times daily. Patient not taking: Reported on 05/05/2018 09/10/17   Delano Metz, FNP  Multiple Vitamin (MULTIVITAMIN) tablet Take 1 tablet by mouth daily.    [provider]  pantoprazole (PROTONIX) 20 MG tablet Take 20 mg by mouth 2 (two) times daily.    [provider]  trimethoprim-polymyxin b (POLYTRIM) ophthalmic solution Place 1 drop into both eyes every 4 (four) hours. Patient not taking: Reported on 05/05/2018 03/25/18   Jodelle Green, FNP    Allergies as of 02/13/2018 - Review Complete 01/13/2018  Allergen Reaction Noted  . Doxycycline Nausea And Vomiting 08/28/2011  . Oxycodone Nausea And Vomiting 08/19/2012  . Seasonal ic [cholestatin]  01/03/2017    Family History  Problem Relation Age of Onset  . Hyperlipidemia Mother   . Osteoporosis Mother   . Hypertension Father   . Arthritis Father   . Sleep apnea Father   . Heart disease Father   . Sleep apnea Brother   . Arthritis Paternal Grandmother   . Diabetes Paternal Grandmother   . Breast cancer Neg Hx     Social History   Socioeconomic  History  . Marital status: Married    Spouse name: Not on file  . Number of children: 2  . Years of education: Not on file  . Highest education level: Not on file  Occupational History  . 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 Financial resource strain: Not on file  . Food insecurity:    Worry: Not on file    Inability: Not on file  . Transportation needs:    Medical: Not on file    Non-medical: Not on file  Tobacco Use  . Smoking status: Former Smoker    Packs/day: 0.25    Years: 5.00    Pack years: 1.25     Types: Cigarettes    Last attempt to quit: 09/28/1988    Years since quitting: 29.6  . Smokeless tobacco: Never Used  . Tobacco comment: smoked briefly as a teenager  Substance and Sexual Activity  . Alcohol use: Yes    Alcohol/week: 1.0 standard drinks    Types: 1 Glasses of wine per week    Comment: Occasional 1-2 per month  . Drug use: Never  . Sexual activity: Not on file  Lifestyle  . Physical activity:    Days per week: Not on file    Minutes per session: Not on file  . Stress: Not on file  Relationships  . Social connections:    Talks on phone: Not on file    Gets together: Not on file    Attends religious service: Not on file    Active member of club or organization: Not on file    Attends meetings of clubs or organizations: Not on file    Relationship status: Not on file  . Intimate partner violence:    Fear of current or ex partner: Not on file    Emotionally abused: Not on file    Physically abused: Not on file    Forced sexual activity: Not on file  Other Topics Concern  . Not on file  Social History Narrative   Regular exercise-yes, walk   Diet: fast food, trying to improve diet          Review of Systems: See HPI, otherwise negative ROS  Physical Exam: BP (!) 153/88   Pulse 96   Temp 97.6 F (36.4 C) (Tympanic)   Ht 5\' 2"  (1.575 m)   Wt 91.6 kg   LMP 05/05/2017   SpO2 100%   BMI 36.95 kg/m  General:   Alert,  pleasant and cooperative in NAD Head:  Normocephalic and atraumatic. Neck:  Supple; no masses or thyromegaly. Lungs:  Clear throughout to auscultation.    Heart:  Regular rate and rhythm. Abdomen:  Soft, nontender and nondistended. Normal bowel sounds, without guarding, and without rebound.   Neurologic:  Alert and  oriented x4;  grossly normal neurologically.  Impression/Plan: [name] is here for an endoscopy and colonoscopy to be performed for iron def anemia and FH colon polyps.  No previous endoscopic procedures.  Risks,  benefits, limitations, and alternatives regarding  endoscopy and colonoscopy have been reviewed with the patient.  Questions have been answered.  All parties agreeable.   Gaylyn Cheers, MD  05/05/2018, 8:29 AM

## 2018-05-05 NOTE — Anesthesia Procedure Notes (Signed)
Performed by: Vaughan Sine Pre-anesthesia Checklist: Patient identified, Emergency Drugs available, Suction available, Patient being monitored and Timeout performed Patient Re-evaluated:Patient Re-evaluated prior to induction Oxygen Delivery Method: Simple face mask Preoxygenation: Pre-oxygenation with 100% oxygen Induction Type: IV induction Ventilation: Oral airway inserted - appropriate to patient size Airway Equipment and Method: Bite block Placement Confirmation: CO2 detector and positive ETCO2

## 2018-05-05 NOTE — Anesthesia Postprocedure Evaluation (Signed)
Anesthesia Post Note  Patient: Donna Hensley  Procedure(s) Performed: COLONOSCOPY WITH PROPOFOL (N/A ) ESOPHAGOGASTRODUODENOSCOPY (EGD) WITH PROPOFOL (N/A )  Patient location during evaluation: Endoscopy Anesthesia Type: General Level of consciousness: awake and alert Pain management: pain level controlled Vital Signs Assessment: post-procedure vital signs reviewed and stable Respiratory status: spontaneous breathing, nonlabored ventilation, respiratory function stable and patient connected to nasal cannula oxygen Cardiovascular status: blood pressure returned to baseline and stable Postop Assessment: no apparent nausea or vomiting Anesthetic complications: no     Last Vitals:  Vitals:   05/05/18 0943 05/05/18 0953  BP: 126/86 136/83  Pulse: 77 75  Resp: 14 18  Temp:    SpO2: 96% 96%    Last Pain:  Vitals:   05/05/18 0953  TempSrc:   PainSc: 0-No pain                 [name]

## 2018-05-05 NOTE — Anesthesia Post-op Follow-up Note (Signed)
Anesthesia QCDR form completed.        

## 2018-05-05 NOTE — Op Note (Signed)
[facility] Gastroenterology Patient Name: Donna Hensley Procedure Date: 05/05/2018 8:28 AM MRN: [id number] Account #: [id number] Date of Birth: 09-Apr-1966 Admit Type: Outpatient Age: 53 Room: [facility] ENDO ROOM 1 Gender: Female Note Status: Finalized Procedure:            Upper GI endoscopy Indications:          Unexplained iron deficiency anemia Providers:            [name], MD Referring MD:         Einar Pheasant, MD (Referring MD) Medicines:            Propofol per Anesthesia Complications:        No immediate complications. Procedure:            Pre-Anesthesia Assessment:                       - After reviewing the risks and benefits, the patient                        was deemed in satisfactory condition to undergo the                        procedure.                       After obtaining informed consent, the endoscope was                        passed under direct vision. Throughout the procedure,                        the patient's blood pressure, pulse, and oxygen                        saturations were monitored continuously. The Endoscope                        was introduced through the mouth, and advanced to the                        second part of duodenum. The upper GI endoscopy was                        accomplished without difficulty. The patient tolerated                        the procedure well. Findings:      The examined esophagus was normal. GEJ 38cm.      Striped mildly erythematous mucosa without bleeding was found in the       gastric antrum. Biopsies were taken with a cold forceps for histology.       Biopsies were taken with a cold forceps for Helicobacter pylori testing.      The examined duodenum was normal. Impression:           - Normal esophagus.                       - Erythematous mucosa in the antrum. Biopsied.                       - Normal  examined duodenum. Recommendation:       - Await pathology results.                      - Perform a colonoscopy as previously scheduled. [name], MD 05/05/2018 8:53:50 AM This report has been signed electronically. Number of Addenda: 0 Note Initiated On: 05/05/2018 8:28 AM      [facility]

## 2018-05-05 NOTE — Op Note (Signed)
[facility] Patient Name: Donna Hensley Procedure Date: 05/05/2018 8:27 AM MRN: [id number] Account #: [id number] Date of Birth: 1965-11-11 Admit Type: Outpatient Age: 53 Room: [facility] ENDO ROOM 1 Gender: Female Note Status: Finalized Procedure:            Colonoscopy Indications:          Colon cancer screening in patient at increased risk:                        Family history of 1st-degree relative with colon polyps Providers:            [name], MD Referring MD:         Einar Pheasant, MD (Referring MD) Medicines:            Propofol per Anesthesia Complications:        No immediate complications. Procedure:            Pre-Anesthesia Assessment:                       - After reviewing the risks and benefits, the patient                        was deemed in satisfactory condition to undergo the                        procedure.                       After obtaining informed consent, the colonoscope was                        passed under direct vision. Throughout the procedure,                        the patient's blood pressure, pulse, and oxygen                        saturations were monitored continuously. The                        Colonoscope was introduced through the anus and                        advanced to the the cecum, identified by appendiceal                        orifice and ileocecal valve. The colonoscopy was                        performed without difficulty. The patient tolerated the                        procedure well. The quality of the bowel preparation                        was good. Findings:      A small polyp was found in the ascending colon. The polyp was sessile.       The polyp was removed with a hot snare. Resection and retrieval were       complete.  A diminutive polyp was found in the cecum. The polyp was sessile. The       polyp was removed with a cold snare. Resection and retrieval were       complete.      A diminutive polyp was found in the ascending colon. The polyp was       sessile. The polyp was removed with a cold snare. Resection and       retrieval were complete.      A diminutive polyp was found in the transverse colon. The polyp was       sessile. The polyp was removed with a cold snare. Resection and       retrieval were complete.      A small polyp was found in the recto-sigmoid colon. The polyp was       sessile. The polyp was removed with a hot snare. Resection and retrieval       were complete.      Internal hemorrhoids were found during endoscopy. The hemorrhoids were       small and Grade I (internal hemorrhoids that do not prolapse). Impression:           - One small polyp in the ascending colon, removed with                        a hot snare. Resected and retrieved.                       - One diminutive polyp in the cecum, removed with a                        cold snare. Resected and retrieved.                       - One diminutive polyp in the ascending colon, removed                        with a cold snare. Resected and retrieved.                       - One diminutive polyp in the transverse colon, removed                        with a cold snare. Resected and retrieved.                       - One small polyp at the recto-sigmoid colon, removed                        with a hot snare. Resected and retrieved.                       - Internal hemorrhoids. Recommendation:       - Await pathology results. [name], MD 05/05/2018 9:23:58 AM This report has been signed electronically. Number of Addenda: 0 Note Initiated On: 05/05/2018 8:27 AM Scope Withdrawal Time: 0 hours 7 minutes 7 seconds  Total Procedure Duration: 0 hours 21 minutes 41 seconds       [facility]

## 2018-05-05 NOTE — Transfer of Care (Signed)
Immediate Anesthesia Transfer of Care Note  Patient: Donna Hensley  Procedure(s) Performed: COLONOSCOPY WITH PROPOFOL (N/A ) ESOPHAGOGASTRODUODENOSCOPY (EGD) WITH PROPOFOL (N/A )  Patient Location: PACU  Anesthesia Type:General  Level of Consciousness: awake and sedated  Airway & Oxygen Therapy: Patient Spontanous Breathing and Patient connected to face mask oxygen  Post-op Assessment: Report given to RN and Post -op Vital signs reviewed and stable  Post vital signs: Reviewed and stable  Last Vitals:  Vitals Value Taken Time  BP    Temp    Pulse    Resp    SpO2      Last Pain:  Vitals:   05/05/18 0817  TempSrc: Tympanic  PainSc: 0-No pain         Complications: No apparent anesthesia complications

## 2018-05-05 NOTE — Anesthesia Preprocedure Evaluation (Signed)
Anesthesia Evaluation  Patient identified by MRN, date of birth, ID band Patient awake    Reviewed: Allergy & Precautions, NPO status , Patient's Chart, lab work & pertinent test results, reviewed documented beta blocker date and time   Airway Mallampati: III  TM Distance: >3 FB     Dental  (+) Chipped   Pulmonary asthma , former smoker,           Cardiovascular hypertension, Pt. on medications      Neuro/Psych    GI/Hepatic GERD  ,  Endo/Other  diabetes, Type 2  Renal/GU      Musculoskeletal   Abdominal   Peds  Hematology   Anesthesia Other Findings   Reproductive/Obstetrics                             Anesthesia Physical Anesthesia Plan  ASA: III  Anesthesia Plan: General   Post-op Pain Management:    Induction: Intravenous  PONV Risk Score and Plan:   Airway Management Planned:   Additional Equipment:   Intra-op Plan:   Post-operative Plan:   Informed Consent: I have reviewed the patients History and Physical, chart, labs and discussed the procedure including the risks, benefits and alternatives for the proposed anesthesia with the patient or authorized representative who has indicated his/her understanding and acceptance.     Plan Discussed with: CRNA  Anesthesia Plan Comments:         Anesthesia Quick Evaluation

## 2018-05-06 ENCOUNTER — Encounter: Payer: Self-pay | Admitting: Internal Medicine

## 2018-05-06 ENCOUNTER — Ambulatory Visit: Payer: BLUE CROSS/BLUE SHIELD | Admitting: Internal Medicine

## 2018-05-06 DIAGNOSIS — J309 Allergic rhinitis, unspecified: Secondary | ICD-10-CM

## 2018-05-06 DIAGNOSIS — E119 Type 2 diabetes mellitus without complications: Secondary | ICD-10-CM

## 2018-05-06 DIAGNOSIS — I10 Essential (primary) hypertension: Secondary | ICD-10-CM | POA: Diagnosis not present

## 2018-05-06 DIAGNOSIS — K219 Gastro-esophageal reflux disease without esophagitis: Secondary | ICD-10-CM | POA: Diagnosis not present

## 2018-05-06 DIAGNOSIS — J4520 Mild intermittent asthma, uncomplicated: Secondary | ICD-10-CM

## 2018-05-06 MED ORDER — PANTOPRAZOLE SODIUM 40 MG PO TBEC: 40.0000 mg | DELAYED_RELEASE_TABLET | Freq: Every day | ORAL | 1 refills | Status: DC

## 2018-05-06 NOTE — Progress Notes (Signed)
Patient ID: Donna Hensley, female   DOB: 1966/03/31, 53 y.o.   MRN: [id number]   Subjective:    Patient ID: Donna Hensley, female    DOB: 1966-04-11, 53 y.o.   MRN: [id number]  HPI  Patient here for a scheduled follow up.  She reports she is doing relatively well.  She is trying to stay active.  Exercising.  Watching her diet.  Discussed recent labs.  a1c improved.  Discussed low carb diet and exercise.  No chest pain.  No sob. No abdominal pain.  Saw GI.  Was going to change to protonix.  Has not started.  Bowels moving.  No urine change.  Handling stress.     Past Medical History:  Diagnosis Date  . Allergy   . Asthma   . GERD (gastroesophageal reflux disease)   . Gestational diabetes   . Hypertension    Past Surgical History:  Procedure Laterality Date  . APPENDECTOMY  1986  . [place]  . CHOLECYSTECTOMY  2013  . COLONOSCOPY WITH PROPOFOL N/A 05/05/2018   Procedure: COLONOSCOPY WITH PROPOFOL;  Surgeon: [name], MD;  Location: [facility] ENDOSCOPY;  Service: Endoscopy;  Laterality: N/A;  . ESOPHAGOGASTRODUODENOSCOPY (EGD) WITH PROPOFOL N/A 05/05/2018   Procedure: ESOPHAGOGASTRODUODENOSCOPY (EGD) WITH PROPOFOL;  Surgeon: [name], MD;  Location: [facility] ENDOSCOPY;  Service: Endoscopy;  Laterality: N/A;  . TONSILLECTOMY  1987  . wisdom tooth removal     Family History  Problem Relation Age of Onset  . Hyperlipidemia Mother   . Osteoporosis Mother   . Hypertension Father   . Arthritis Father   . Sleep apnea Father   . Heart disease Father   . Sleep apnea Brother   . Arthritis Paternal Grandmother   . Diabetes Paternal Grandmother   . Breast cancer Neg Hx    Social History   Socioeconomic History  . Marital status: Married    Spouse name: Not on file  . Number of children: 2  . Years of education: Not on file  . Highest education level: Not on file  Occupational History  . Occupation: Retail banker: [employer]    Social Needs  . Financial resource strain: Not on file  . Food insecurity:    Worry: Not on file    Inability: Not on file  . Transportation needs:    Medical: Not on file    Non-medical: Not on file  Tobacco Use  . Smoking status: Former Smoker    Packs/day: 0.25    Years: 5.00    Pack years: 1.25    Types: Cigarettes    Last attempt to quit: 09/28/1988    Years since quitting: 29.6  . Smokeless tobacco: Never Used  . Tobacco comment: smoked briefly as a teenager  Substance and Sexual Activity  . Alcohol use: Yes    Alcohol/week: 1.0 standard drinks    Types: 1 Glasses of wine per week    Comment: Occasional 1-2 per month  . Drug use: Never  . Sexual activity: Not on file  Lifestyle  . Physical activity:    Days per week: Not on file    Minutes per session: Not on file  . Stress: Not on file  Relationships  . Social connections:    Talks on phone: Not on file    Gets together: Not on file    Attends religious service: Not on file    Active member of club  or organization: Not on file    Attends meetings of clubs or organizations: Not on file    Relationship status: Not on file  Other Topics Concern  . Not on file  Social History Narrative   Regular exercise-yes, walk   Diet: fast food, trying to improve diet          Outpatient Encounter Medications as of 05/06/2018  Medication Sig  . albuterol (PROVENTIL HFA;VENTOLIN HFA) 108 (90 Base) MCG/ACT inhaler Inhale 2 puffs into the lungs every 6 (six) hours as needed for wheezing.  [name] BREO ELLIPTA 200-25 MCG/INH AEPB Inhale 1 puff into the lungs daily.   . Calcium-Magnesium-Vitamin D (CALCIUM 500 PO) Take 1 tablet by mouth daily.  . Cholecalciferol (VITAMIN D3) 1000 units CAPS Take 1 capsule by mouth daily.  . Cyanocobalamin (VITAMIN B 12 PO) Take 1 tablet by mouth daily.  . Fe Fum-FePoly-Vit C-Vit B3 (INTEGRA) 62.5-62.5-40-3 MG CAPS Take 1 capsule by mouth daily.  . fluticasone (FLONASE) 50 MCG/ACT nasal spray USE 2  SPRAYS IN EACH NOSTRIL DAILY  . glucose blood test strip Use as instructed  . levocetirizine (XYZAL) 5 MG tablet Take 5 mg by mouth every evening.  [name] losartan (COZAAR) 50 MG tablet TAKE 1 TABLET DAILY  . metFORMIN (GLUCOPHAGE) 500 MG tablet Take 1 tablet (500 mg total) by mouth 2 (two) times daily.  . montelukast (SINGULAIR) 10 MG tablet TAKE 1 TABLET DAILY AT BEDTIME  . Multiple Vitamin (MULTIVITAMIN) tablet Take 1 tablet by mouth daily.  . pantoprazole (PROTONIX) 40 MG tablet Take 1 tablet (40 mg total) by mouth daily.  [name] VITAMIN E PO Take by mouth daily.  . [DISCONTINUED] amoxicillin-clavulanate (AUGMENTIN) 875-125 MG tablet Take 1 tablet by mouth 2 (two) times daily. (Patient not taking: Reported on 05/05/2018)  . [DISCONTINUED] omeprazole (PRILOSEC) 40 MG capsule TAKE 1 CAPSULE DAILY  . [DISCONTINUED] pantoprazole (PROTONIX) 20 MG tablet Take 20 mg by mouth 2 (two) times daily.  . [DISCONTINUED] trimethoprim-polymyxin b (POLYTRIM) ophthalmic solution Place 1 drop into both eyes every 4 (four) hours. (Patient not taking: Reported on 05/05/2018)   No facility-administered encounter medications on file as of 05/06/2018.     Review of Systems  Constitutional: Negative for appetite change and unexpected weight change.  HENT: Negative for congestion and sneezing.   Respiratory: Negative for cough, chest tightness and shortness of breath.   Cardiovascular: Negative for chest pain, palpitations and leg swelling.  Gastrointestinal: Negative for abdominal pain, diarrhea, nausea and vomiting.  Genitourinary: Negative for difficulty urinating and dysuria.  Musculoskeletal: Negative for joint swelling and myalgias.  Skin: Negative for color change and rash.  Neurological: Negative for dizziness, light-headedness and headaches.  Psychiatric/Behavioral: Negative for agitation and dysphoric mood.       Objective:    Physical Exam Constitutional:      General: She is not in acute distress.     Appearance: Normal appearance.  HENT:     Nose: Nose normal. No congestion.     Mouth/Throat:     Pharynx: No oropharyngeal exudate or posterior oropharyngeal erythema.  Neck:     Musculoskeletal: Neck supple. No muscular tenderness.     Thyroid: No thyromegaly.  Cardiovascular:     Rate and Rhythm: Normal rate and regular rhythm.  Pulmonary:     Effort: No respiratory distress.     Breath sounds: Normal breath sounds. No wheezing.  Abdominal:     General: Bowel sounds are normal.     Palpations: Abdomen  is soft.     Tenderness: There is no abdominal tenderness.  Musculoskeletal:        General: No swelling or tenderness.     Comments: Feet:  No lesions.  DP pulses palpable and equal bilaterally.  Intact to light touch and pin prick.    Lymphadenopathy:     Cervical: No cervical adenopathy.  Skin:    Findings: No erythema or rash.  Neurological:     Mental Status: She is alert.  Psychiatric:        Mood and Affect: Mood normal.        Behavior: Behavior normal.     BP 126/70 (BP Location: Left Arm, Patient Position: Sitting, Cuff Size: Normal)   Pulse 69   Temp (!) 97.5 F (36.4 C) (Oral)   Resp 16   Wt 211 lb 6.4 oz (95.9 kg)   SpO2 99%   BMI 38.67 kg/m  Wt Readings from Last 3 Encounters:  05/06/18 211 lb 6.4 oz (95.9 kg)  05/05/18 202 lb (91.6 kg)  03/25/18 209 lb (94.8 kg)     Lab Results  Component Value Date   WBC 8.5 05/02/2018   HGB 14.9 05/02/2018   HCT 44.8 05/02/2018   PLT 316.0 05/02/2018   GLUCOSE 122 (H) 05/02/2018   CHOL 160 05/02/2018   TRIG 63.0 05/02/2018   HDL 56.50 05/02/2018   LDLCALC 91 05/02/2018   ALT 33 05/02/2018   AST 25 05/02/2018   NA 139 05/02/2018   K 4.1 05/02/2018   CL 105 05/02/2018   CREATININE 0.87 05/02/2018   BUN 13 05/02/2018   CO2 24 05/02/2018   TSH 1.61 06/27/2017   HGBA1C 6.2 05/02/2018   MICROALBUR 1.7 12/05/2017       Assessment & Plan:   Problem List Items Addressed This Visit    Allergic  rhinitis    Sees an allergist.  Currently doing well on current regimen.  Follow.        Diabetes ([place])    Low carb diet and exercise.  Discussed recent a1c.  Improved.  Follow met b and a1c.        Relevant Orders   Hemoglobin A1c   Hepatic function panel   Lipid panel   Basic metabolic panel   Essential hypertension    Blood pressure under good control.  Continue same medication regimen.  Follow pressures.  Follow metabolic panel.        Relevant Orders   TSH   GERD    Saw GI.  Start protonix.  Follow.        Relevant Medications   pantoprazole (PROTONIX) 40 MG tablet   Mild intermittent asthma in adult without complication    Breathing stable.  Seeing an allergist.            Einar Pheasant, MD

## 2018-05-07 LAB — SURGICAL PATHOLOGY

## 2018-05-08 DIAGNOSIS — J3089 Other allergic rhinitis: Secondary | ICD-10-CM | POA: Diagnosis not present

## 2018-05-10 ENCOUNTER — Encounter: Payer: Self-pay | Admitting: Internal Medicine

## 2018-05-10 NOTE — Assessment & Plan Note (Signed)
Saw GI.  Start protonix.  Follow.

## 2018-05-10 NOTE — Assessment & Plan Note (Signed)
Low carb diet and exercise.  Discussed recent a1c.  Improved.  Follow met b and a1c.

## 2018-05-10 NOTE — Assessment & Plan Note (Signed)
Blood pressure under good control.  Continue same medication regimen.  Follow pressures.  Follow metabolic panel.   

## 2018-05-10 NOTE — Assessment & Plan Note (Signed)
Sees an allergist.  Currently doing well on current regimen.  Follow.

## 2018-05-10 NOTE — Assessment & Plan Note (Signed)
Breathing stable.  Seeing an allergist.

## 2018-05-15 DIAGNOSIS — H1045 Other chronic allergic conjunctivitis: Secondary | ICD-10-CM | POA: Diagnosis not present

## 2018-05-15 DIAGNOSIS — J3089 Other allergic rhinitis: Secondary | ICD-10-CM | POA: Diagnosis not present

## 2018-05-15 DIAGNOSIS — J209 Acute bronchitis, unspecified: Secondary | ICD-10-CM | POA: Diagnosis not present

## 2018-05-15 DIAGNOSIS — J4530 Mild persistent asthma, uncomplicated: Secondary | ICD-10-CM | POA: Diagnosis not present

## 2018-05-20 DIAGNOSIS — J3089 Other allergic rhinitis: Secondary | ICD-10-CM | POA: Diagnosis not present

## 2018-05-26 ENCOUNTER — Encounter: Payer: Self-pay | Admitting: Internal Medicine

## 2018-05-26 NOTE — Telephone Encounter (Signed)
LMTCB

## 2018-05-27 ENCOUNTER — Telehealth: Payer: Self-pay

## 2018-05-27 ENCOUNTER — Other Ambulatory Visit: Payer: Self-pay | Admitting: Internal Medicine

## 2018-05-27 NOTE — Telephone Encounter (Signed)
Copied from [place] [phone]. Topic: General - Other >> May 26, 2018  5:16 PM Yvette Rack wrote: Reason for CRM: Pt returned call to office. Pt requests call back. Cb# [id number]

## 2018-05-28 NOTE — Telephone Encounter (Signed)
LMTCB

## 2018-05-29 DIAGNOSIS — J3089 Other allergic rhinitis: Secondary | ICD-10-CM | POA: Diagnosis not present

## 2018-05-29 NOTE — Telephone Encounter (Signed)
See my chart message

## 2018-05-30 ENCOUNTER — Other Ambulatory Visit: Payer: Self-pay

## 2018-05-30 MED ORDER — PANTOPRAZOLE SODIUM 40 MG PO TBEC: 40.0000 mg | DELAYED_RELEASE_TABLET | Freq: Every day | ORAL | 1 refills | Status: DC

## 2018-06-05 DIAGNOSIS — J3089 Other allergic rhinitis: Secondary | ICD-10-CM | POA: Diagnosis not present

## 2018-06-12 DIAGNOSIS — J3089 Other allergic rhinitis: Secondary | ICD-10-CM | POA: Diagnosis not present

## 2018-06-19 DIAGNOSIS — J3089 Other allergic rhinitis: Secondary | ICD-10-CM | POA: Diagnosis not present

## 2018-06-26 DIAGNOSIS — J3089 Other allergic rhinitis: Secondary | ICD-10-CM | POA: Diagnosis not present

## 2018-07-03 DIAGNOSIS — J3089 Other allergic rhinitis: Secondary | ICD-10-CM | POA: Diagnosis not present

## 2018-07-10 DIAGNOSIS — J3089 Other allergic rhinitis: Secondary | ICD-10-CM | POA: Diagnosis not present

## 2018-07-17 DIAGNOSIS — J3089 Other allergic rhinitis: Secondary | ICD-10-CM | POA: Diagnosis not present

## 2018-07-24 DIAGNOSIS — J3089 Other allergic rhinitis: Secondary | ICD-10-CM | POA: Diagnosis not present

## 2018-08-01 DIAGNOSIS — J3089 Other allergic rhinitis: Secondary | ICD-10-CM | POA: Diagnosis not present

## 2018-08-07 DIAGNOSIS — J301 Allergic rhinitis due to pollen: Secondary | ICD-10-CM | POA: Diagnosis not present

## 2018-08-07 DIAGNOSIS — J3089 Other allergic rhinitis: Secondary | ICD-10-CM | POA: Diagnosis not present

## 2018-08-11 ENCOUNTER — Other Ambulatory Visit: Payer: BLUE CROSS/BLUE SHIELD

## 2018-08-12 ENCOUNTER — Encounter: Payer: BLUE CROSS/BLUE SHIELD | Admitting: Internal Medicine

## 2018-08-15 DIAGNOSIS — J3089 Other allergic rhinitis: Secondary | ICD-10-CM | POA: Diagnosis not present

## 2018-08-21 DIAGNOSIS — J3089 Other allergic rhinitis: Secondary | ICD-10-CM | POA: Diagnosis not present

## 2018-08-29 DIAGNOSIS — J3089 Other allergic rhinitis: Secondary | ICD-10-CM | POA: Diagnosis not present

## 2018-09-04 DIAGNOSIS — J3089 Other allergic rhinitis: Secondary | ICD-10-CM | POA: Diagnosis not present

## 2018-09-11 DIAGNOSIS — J3089 Other allergic rhinitis: Secondary | ICD-10-CM | POA: Diagnosis not present

## 2018-09-19 DIAGNOSIS — J3089 Other allergic rhinitis: Secondary | ICD-10-CM | POA: Diagnosis not present

## 2018-09-29 ENCOUNTER — Other Ambulatory Visit: Payer: Self-pay | Admitting: Internal Medicine

## 2018-09-30 DIAGNOSIS — J3089 Other allergic rhinitis: Secondary | ICD-10-CM | POA: Diagnosis not present

## 2018-10-07 DIAGNOSIS — J3089 Other allergic rhinitis: Secondary | ICD-10-CM | POA: Diagnosis not present

## 2018-10-10 ENCOUNTER — Encounter: Payer: BLUE CROSS/BLUE SHIELD | Admitting: Internal Medicine

## 2018-10-14 DIAGNOSIS — J3089 Other allergic rhinitis: Secondary | ICD-10-CM | POA: Diagnosis not present

## 2018-10-14 DIAGNOSIS — J301 Allergic rhinitis due to pollen: Secondary | ICD-10-CM | POA: Diagnosis not present

## 2018-10-23 DIAGNOSIS — H1045 Other chronic allergic conjunctivitis: Secondary | ICD-10-CM | POA: Diagnosis not present

## 2018-10-23 DIAGNOSIS — J4530 Mild persistent asthma, uncomplicated: Secondary | ICD-10-CM | POA: Diagnosis not present

## 2018-10-23 DIAGNOSIS — J3089 Other allergic rhinitis: Secondary | ICD-10-CM | POA: Diagnosis not present

## 2018-10-24 ENCOUNTER — Ambulatory Visit: Payer: BLUE CROSS/BLUE SHIELD | Admitting: Podiatry

## 2018-10-24 ENCOUNTER — Encounter: Payer: Self-pay | Admitting: Podiatry

## 2018-10-24 ENCOUNTER — Ambulatory Visit (INDEPENDENT_AMBULATORY_CARE_PROVIDER_SITE_OTHER): Payer: BC Managed Care – PPO

## 2018-10-24 ENCOUNTER — Other Ambulatory Visit: Payer: Self-pay

## 2018-10-24 DIAGNOSIS — S99921A Unspecified injury of right foot, initial encounter: Secondary | ICD-10-CM | POA: Diagnosis not present

## 2018-10-28 DIAGNOSIS — J3089 Other allergic rhinitis: Secondary | ICD-10-CM | POA: Diagnosis not present

## 2018-10-29 NOTE — Progress Notes (Signed)
   HPI: 53 year old female presenting today with a chief complaint of pain to the right dorsolateral foot that began one week ago after dropping a jar of mayonnaise on it. She reports associated bruising and swelling. She has not done anything for treatment and there are no modifying factors noted. Patient is here for further evaluation and treatment.   Past Medical History:  Diagnosis Date  . Allergy   . Asthma   . GERD (gastroesophageal reflux disease)   . Gestational diabetes   . Hypertension      Physical Exam: General: The patient is alert and oriented x3 in no acute distress.  Dermatology: Skin is warm, dry and supple bilateral lower extremities. Negative for open lesions or macerations.  Vascular: Palpable pedal pulses bilaterally. No erythema noted. Capillary refill within normal limits.  Neurological: Epicritic and protective threshold grossly intact bilaterally.   Musculoskeletal Exam: Tenderness to palpation, ecchymosis and edema noted to the right dorsal foot. Range of motion within normal limits to all pedal and ankle joints bilateral. Muscle strength 5/5 in all groups bilateral.   Radiographic Exam:  Normal osseous mineralization. Joint spaces preserved. No fracture/dislocation/boney destruction.    Assessment: 1. Contusion right dorsal foot   Plan of Care:  1. Patient evaluated. X-Rays reviewed.  2. Recommended OTC Motrin as needed.  3. Recommended good shoe gear.  4. Return to clinic as needed.       Edrick Kins, DPM [facility]  Dr. Edrick Kins, DPM    [address], [place] [address]                Office [phone]  Fax [phone]

## 2018-11-04 DIAGNOSIS — J3089 Other allergic rhinitis: Secondary | ICD-10-CM | POA: Diagnosis not present

## 2018-11-08 ENCOUNTER — Other Ambulatory Visit: Payer: Self-pay | Admitting: Internal Medicine

## 2018-11-11 DIAGNOSIS — J3089 Other allergic rhinitis: Secondary | ICD-10-CM | POA: Diagnosis not present

## 2018-11-18 DIAGNOSIS — J3089 Other allergic rhinitis: Secondary | ICD-10-CM | POA: Diagnosis not present

## 2018-11-20 DIAGNOSIS — J3089 Other allergic rhinitis: Secondary | ICD-10-CM | POA: Diagnosis not present

## 2018-11-25 DIAGNOSIS — J3089 Other allergic rhinitis: Secondary | ICD-10-CM | POA: Diagnosis not present

## 2018-12-09 DIAGNOSIS — J3089 Other allergic rhinitis: Secondary | ICD-10-CM | POA: Diagnosis not present

## 2018-12-16 DIAGNOSIS — J3089 Other allergic rhinitis: Secondary | ICD-10-CM | POA: Diagnosis not present

## 2018-12-25 DIAGNOSIS — J3089 Other allergic rhinitis: Secondary | ICD-10-CM | POA: Diagnosis not present

## 2019-01-01 DIAGNOSIS — J3089 Other allergic rhinitis: Secondary | ICD-10-CM | POA: Diagnosis not present

## 2019-01-08 DIAGNOSIS — J3089 Other allergic rhinitis: Secondary | ICD-10-CM | POA: Diagnosis not present

## 2019-01-15 DIAGNOSIS — J3089 Other allergic rhinitis: Secondary | ICD-10-CM | POA: Diagnosis not present

## 2019-01-23 DIAGNOSIS — J3089 Other allergic rhinitis: Secondary | ICD-10-CM | POA: Diagnosis not present

## 2019-01-29 ENCOUNTER — Other Ambulatory Visit: Payer: Self-pay

## 2019-02-02 ENCOUNTER — Other Ambulatory Visit (HOSPITAL_COMMUNITY)
Admission: RE | Admit: 2019-02-02 | Discharge: 2019-02-02 | Disposition: A | Discharge status: Home / Self Care | Payer: BC Managed Care – PPO | Source: Ambulatory Visit | Attending: Internal Medicine | Admitting: Internal Medicine

## 2019-02-02 ENCOUNTER — Ambulatory Visit (INDEPENDENT_AMBULATORY_CARE_PROVIDER_SITE_OTHER): Payer: BC Managed Care – PPO | Admitting: Internal Medicine

## 2019-02-02 ENCOUNTER — Other Ambulatory Visit: Payer: Self-pay

## 2019-02-02 VITALS — BP 120/70 | HR 80 | Resp 16 | Ht 62.0 in | Wt 212.0 lb

## 2019-02-02 DIAGNOSIS — Z0000 Encounter for general adult medical examination without abnormal findings: Secondary | ICD-10-CM

## 2019-02-02 DIAGNOSIS — Z124 Encounter for screening for malignant neoplasm of cervix: Secondary | ICD-10-CM

## 2019-02-02 DIAGNOSIS — E119 Type 2 diabetes mellitus without complications: Secondary | ICD-10-CM | POA: Diagnosis not present

## 2019-02-02 DIAGNOSIS — Z23 Encounter for immunization: Secondary | ICD-10-CM | POA: Diagnosis not present

## 2019-02-02 DIAGNOSIS — I10 Essential (primary) hypertension: Secondary | ICD-10-CM | POA: Diagnosis not present

## 2019-02-02 DIAGNOSIS — K219 Gastro-esophageal reflux disease without esophagitis: Secondary | ICD-10-CM

## 2019-02-02 DIAGNOSIS — E611 Iron deficiency: Secondary | ICD-10-CM

## 2019-02-02 DIAGNOSIS — Z1231 Encounter for screening mammogram for malignant neoplasm of breast: Secondary | ICD-10-CM | POA: Diagnosis not present

## 2019-02-02 DIAGNOSIS — J4520 Mild intermittent asthma, uncomplicated: Secondary | ICD-10-CM

## 2019-02-02 NOTE — Progress Notes (Signed)
Patient ID: Donna Hensley, female   DOB: 1966/02/25, 53 y.o.   MRN: [id number]   Subjective:    Patient ID: Donna Hensley, female    DOB: 02-05-1966, 53 y.o.   MRN: [id number]  HPI  Patient here for her physical exam.  She reports she is doing relatively well.  Daughter is over in [place].  Unable to come home.  Daughter doing well.  Handling stress.  Has not been watching herd diet as well.  Not exercising regularly.  Discussed diet and exercise.  No chest pain. No sob.  No aicd reflux.  No abdominal pain.  Bowels moving.     Past Medical History:  Diagnosis Date  . Allergy   . Asthma   . GERD (gastroesophageal reflux disease)   . Gestational diabetes   . Hypertension    Past Surgical History:  Procedure Laterality Date  . APPENDECTOMY  1986  . [place]  . CHOLECYSTECTOMY  2013  . COLONOSCOPY WITH PROPOFOL N/A 05/05/2018   Procedure: COLONOSCOPY WITH PROPOFOL;  Surgeon: [name], MD;  Location: [facility] ENDOSCOPY;  Service: Endoscopy;  Laterality: N/A;  . ESOPHAGOGASTRODUODENOSCOPY (EGD) WITH PROPOFOL N/A 05/05/2018   Procedure: ESOPHAGOGASTRODUODENOSCOPY (EGD) WITH PROPOFOL;  Surgeon: [name], MD;  Location: [facility] ENDOSCOPY;  Service: Endoscopy;  Laterality: N/A;  . TONSILLECTOMY  1987  . wisdom tooth removal     Family History  Problem Relation Age of Onset  . Hyperlipidemia Mother   . Osteoporosis Mother   . Hypertension Father   . Arthritis Father   . Sleep apnea Father   . Heart disease Father   . Sleep apnea Brother   . Arthritis Paternal Grandmother   . Diabetes Paternal Grandmother   . Breast cancer Neg Hx    Social History   Socioeconomic History  . Marital status: Married    Spouse name: Not on file  . Number of children: 2  . Years of education: Not on file  . Highest education level: Not on file  Occupational History  . 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 Financial resource  strain: Not on file  . Food insecurity    Worry: Not on file    Inability: Not on file  . Transportation needs    Medical: Not on file    Non-medical: Not on file  Tobacco Use  . Smoking status: Former Smoker    Packs/day: 0.25    Years: 5.00    Pack years: 1.25    Types: Cigarettes    Quit date: 09/28/1988    Years since quitting: 30.3  . Smokeless tobacco: Never Used  . Tobacco comment: smoked briefly as a teenager  Substance and Sexual Activity  . Alcohol use: Yes    Alcohol/week: 1.0 standard drinks    Types: 1 Glasses of wine per week    Comment: Occasional 1-2 per month  . Drug use: Never  . Sexual activity: Not on file  Lifestyle  . Physical activity    Days per week: Not on file    Minutes per session: Not on file  . Stress: Not on file  Relationships  . Social Herbalist on phone: Not on file    Gets together: Not on file    Attends religious service: Not on file    Active member of club or organization: Not on file    Attends meetings of clubs  or organizations: Not on file    Relationship status: Not on file  Other Topics Concern  . Not on file  Social History Narrative   Regular exercise-yes, walk   Diet: fast food, trying to improve diet          Outpatient Encounter Medications as of 02/02/2019  Medication Sig  . albuterol (PROVENTIL HFA;VENTOLIN HFA) 108 (90 Base) MCG/ACT inhaler Inhale 2 puffs into the lungs every 6 (six) hours as needed for wheezing.  [name] BREO ELLIPTA 200-25 MCG/INH AEPB Inhale 1 puff into the lungs daily.   . Calcium-Magnesium-Vitamin D (CALCIUM 500 PO) Take 1 tablet by mouth daily.  . Cholecalciferol (VITAMIN D3) 1000 units CAPS Take 1 capsule by mouth daily.  . Cyanocobalamin (VITAMIN B 12 PO) Take 1 tablet by mouth daily.  . fluticasone (FLONASE) 50 MCG/ACT nasal spray USE 2 SPRAYS IN EACH NOSTRIL DAILY  . glucose blood test strip Use as instructed  . levocetirizine (XYZAL) 5 MG tablet Take 5 mg by mouth every evening.   [name] losartan (COZAAR) 50 MG tablet TAKE 1 TABLET DAILY  . metFORMIN (GLUCOPHAGE) 500 MG tablet TAKE 1 TABLET TWICE A DAY  . montelukast (SINGULAIR) 10 MG tablet TAKE 1 TABLET DAILY AT BEDTIME  . Multiple Vitamin (MULTIVITAMIN) tablet Take 1 tablet by mouth daily.  . pantoprazole (PROTONIX) 40 MG tablet TAKE 1 TABLET DAILY  . VITAMIN E PO Take by mouth daily.  . [DISCONTINUED] Fe Fum-FePoly-Vit C-Vit B3 (INTEGRA) 62.5-62.5-40-3 MG CAPS Take 1 capsule by mouth daily.   No facility-administered encounter medications on file as of 02/02/2019.     Review of Systems  Constitutional: Negative for appetite change and unexpected weight change.  HENT: Negative for congestion and sinus pressure.   Eyes: Negative for pain and visual disturbance.  Respiratory: Negative for cough, chest tightness and shortness of breath.   Cardiovascular: Negative for chest pain, palpitations and leg swelling.  Gastrointestinal: Negative for abdominal pain, diarrhea, nausea and vomiting.  Genitourinary: Negative for difficulty urinating and dysuria.  Musculoskeletal: Negative for joint swelling and myalgias.  Skin: Negative for color change and rash.  Neurological: Negative for dizziness, light-headedness and headaches.  Hematological: Negative for adenopathy. Does not bruise/bleed easily.  Psychiatric/Behavioral: Negative for agitation and dysphoric mood.       Objective:    Physical Exam Constitutional:      General: She is not in acute distress.    Appearance: Normal appearance. She is well-developed.  HENT:     Head: Normocephalic and atraumatic.     Right Ear: External ear normal.     Left Ear: External ear normal.  Eyes:     General: No scleral icterus.       Right eye: No discharge.        Left eye: No discharge.     Conjunctiva/sclera: Conjunctivae normal.  Neck:     Musculoskeletal: Neck supple. No muscular tenderness.     Thyroid: No thyromegaly.  Cardiovascular:     Rate and Rhythm: Normal  rate and regular rhythm.  Pulmonary:     Effort: No tachypnea, accessory muscle usage or respiratory distress.     Breath sounds: Normal breath sounds. No decreased breath sounds or wheezing.  Chest:     Breasts:        Right: No inverted nipple, mass, nipple discharge or tenderness (no axillary adenopathy).        Left: No inverted nipple, mass, nipple discharge or tenderness (no axilarry adenopathy).  Abdominal:  General: Bowel sounds are normal.     Palpations: Abdomen is soft.     Tenderness: There is no abdominal tenderness.  Genitourinary:    Comments: Normal external genitalia.  Vaginal vault without lesions.  Cervix identified.  Pap smear performed.  Could not appreciate any adnexal masses or tenderness.   Musculoskeletal:        General: No swelling or tenderness.  Lymphadenopathy:     Cervical: No cervical adenopathy.  Skin:    Findings: No erythema or rash.  Neurological:     Mental Status: She is alert and oriented to person, place, and time.  Psychiatric:        Mood and Affect: Mood normal.        Behavior: Behavior normal.     BP 120/70   Pulse 80   Resp 16   Ht 5' 2" (1.575 m)   Wt 212 lb (96.2 kg)   SpO2 99%   BMI 38.78 kg/m  Wt Readings from Last 3 Encounters:  02/02/19 212 lb (96.2 kg)  05/06/18 211 lb 6.4 oz (95.9 kg)  05/05/18 202 lb (91.6 kg)     Lab Results  Component Value Date   WBC 8.5 05/02/2018   HGB 14.9 05/02/2018   HCT 44.8 05/02/2018   PLT 316.0 05/02/2018   GLUCOSE 96 02/02/2019   CHOL 165 02/02/2019   TRIG 75.0 02/02/2019   HDL 57.50 02/02/2019   LDLCALC 92 02/02/2019   ALT 37 (H) 02/02/2019   AST 30 02/02/2019   NA 137 02/02/2019   K 3.9 02/02/2019   CL 103 02/02/2019   CREATININE 0.84 02/02/2019   BUN 9 02/02/2019   CO2 24 02/02/2019   TSH 1.10 02/02/2019   HGBA1C 7.1 (H) 02/02/2019   MICROALBUR 1.3 02/02/2019       Assessment & Plan:   Problem List Items Addressed This Visit    Diabetes ([place])    Not  watching her diet and not exercising regularly.  Low carb diet and exercise.  Follow met b and a1c.       Relevant Orders   Microalbumin / creatinine urine ratio (Completed)   Hemoglobin A1c   Lipid panel   Basic metabolic panel   Essential hypertension    Blood pressure under good control.  Continue same medication regimen.  Follow pressures.  Follow metabolic panel.        Relevant Orders   CBC with Differential/Platelet   Hepatic function panel   GERD    Controlled.        Health care maintenance    Physical today 02/02/19.  PAP 02/02/19.  Mammogram 07/2017 - [place] I.  Schedule f/u mammogram.  Colonoscopy 04/2018.  Recommended f/u colonoscopy in 04/2021.        Iron deficiency    Follow cbc/ferritin.       Mild intermittent asthma in adult without complication    Breathing stable.         Other Visit Diagnoses    Encounter for screening mammogram for malignant neoplasm of breast    -  Primary   Relevant Orders   MM 3D SCREEN BREAST BILATERAL   Cervical cancer screening       Relevant Orders   Cytology - PAP( [place])   Need for immunization against influenza       Relevant Orders   Flu Vaccine QUAD 36+ mos IM (Completed)       Einar Pheasant, MD

## 2019-02-02 NOTE — Assessment & Plan Note (Addendum)
Physical today 02/02/19.  PAP 02/02/19.  Mammogram 07/2017 - [place] I.  Schedule f/u mammogram.  Colonoscopy 04/2018.  Recommended f/u colonoscopy in 04/2021.

## 2019-02-03 LAB — BASIC METABOLIC PANEL
BUN: 9 mg/dL (ref 6–23)
CO2: 24 mEq/L (ref 19–32)
Calcium: 10 mg/dL (ref 8.4–10.5)
Chloride: 103 mEq/L (ref 96–112)
Creatinine, Ser: 0.84 mg/dL (ref 0.40–1.20)
GFR: 71.53 mL/min (ref >60.00)
Glucose, Bld: 96 mg/dL (ref 70–99)
Potassium: 3.9 mEq/L (ref 3.5–5.1)
Sodium: 137 mEq/L (ref 135–145)

## 2019-02-03 LAB — LIPID PANEL
Cholesterol: 165 mg/dL (ref 0–200)
HDL: 57.5 mg/dL (ref >39.00)
LDL Cholesterol: 92 mg/dL (ref 0–99)
NonHDL: 107.43
Total CHOL/HDL Ratio: 3
Triglycerides: 75 mg/dL (ref 0.0–149.0)
VLDL: 15 mg/dL (ref 0.0–40.0)

## 2019-02-03 LAB — HEPATIC FUNCTION PANEL
ALT: 37 U/L — ABNORMAL HIGH (ref 0–35)
AST: 30 U/L (ref 0–37)
Albumin: 4.3 g/dL (ref 3.5–5.2)
Alkaline Phosphatase: 109 U/L (ref 39–117)
Bilirubin, Direct: 0.2 mg/dL (ref 0.0–0.3)
Total Bilirubin: 0.8 mg/dL (ref 0.2–1.2)
Total Protein: 7.4 g/dL (ref 6.0–8.3)

## 2019-02-03 LAB — MICROALBUMIN / CREATININE URINE RATIO
Creatinine,U: 59.6 mg/dL
Microalb Creat Ratio: 2.2 mg/g (ref 0.0–30.0)
Microalb, Ur: 1.3 mg/dL (ref 0.0–1.9)

## 2019-02-03 LAB — HEMOGLOBIN A1C: Hgb A1c MFr Bld: 7.1 % — ABNORMAL HIGH (ref 4.6–6.5)

## 2019-02-03 LAB — TSH: TSH: 1.1 u[IU]/mL (ref 0.35–4.50)

## 2019-02-04 ENCOUNTER — Other Ambulatory Visit: Payer: Self-pay | Admitting: Internal Medicine

## 2019-02-04 DIAGNOSIS — R7989 Other specified abnormal findings of blood chemistry: Secondary | ICD-10-CM

## 2019-02-04 DIAGNOSIS — R945 Abnormal results of liver function studies: Secondary | ICD-10-CM

## 2019-02-04 NOTE — Progress Notes (Signed)
Order placed for f/u lab.   

## 2019-02-05 DIAGNOSIS — J3089 Other allergic rhinitis: Secondary | ICD-10-CM | POA: Diagnosis not present

## 2019-02-07 ENCOUNTER — Encounter: Payer: Self-pay | Admitting: Internal Medicine

## 2019-02-07 NOTE — Assessment & Plan Note (Signed)
Breathing stable.

## 2019-02-07 NOTE — Assessment & Plan Note (Signed)
Follow cbc/ferritin.

## 2019-02-07 NOTE — Assessment & Plan Note (Signed)
Blood pressure under good control.  Continue same medication regimen.  Follow pressures.  Follow metabolic panel.   

## 2019-02-07 NOTE — Assessment & Plan Note (Signed)
Not watching her diet and not exercising regularly.  Low carb diet and exercise.  Follow met b and a1c.

## 2019-02-07 NOTE — Assessment & Plan Note (Signed)
Controlled.  

## 2019-02-10 LAB — CYTOLOGY - PAP
Diagnosis: NEGATIVE
High risk HPV: NEGATIVE

## 2019-02-11 ENCOUNTER — Encounter: Payer: Self-pay | Admitting: Internal Medicine

## 2019-02-12 DIAGNOSIS — J3089 Other allergic rhinitis: Secondary | ICD-10-CM | POA: Diagnosis not present

## 2019-02-19 DIAGNOSIS — J3089 Other allergic rhinitis: Secondary | ICD-10-CM | POA: Diagnosis not present

## 2019-02-26 DIAGNOSIS — J301 Allergic rhinitis due to pollen: Secondary | ICD-10-CM | POA: Diagnosis not present

## 2019-02-26 DIAGNOSIS — J3089 Other allergic rhinitis: Secondary | ICD-10-CM | POA: Diagnosis not present

## 2019-02-27 ENCOUNTER — Ambulatory Visit (INDEPENDENT_AMBULATORY_CARE_PROVIDER_SITE_OTHER)
Admission: RE | Admit: 2019-02-27 | Discharge: 2019-02-27 | Disposition: A | Discharge status: Home / Self Care | Payer: BC Managed Care – PPO | Source: Ambulatory Visit

## 2019-02-27 ENCOUNTER — Other Ambulatory Visit: Payer: Self-pay

## 2019-02-27 DIAGNOSIS — B370 Candidal stomatitis: Secondary | ICD-10-CM | POA: Diagnosis not present

## 2019-02-27 MED ORDER — NYSTATIN 100000 UNIT/ML MT SUSP: 500000.0000 [IU] | Freq: Four times a day (QID) | OROMUCOSAL | 0 refills | Status: DC

## 2019-02-27 NOTE — ED Provider Notes (Signed)
[place]  Virtual Visit via Video Note:  Donna Hensley  initiated request for Telemedicine visit with [facility] team. I connected with Donna Hensley  on 02/27/2019 at [place] PM  for a synchronized telemedicine visit using a video enabled HIPPA compliant telemedicine application. I verified that I am speaking with Donna Hensley  using two identifiers. [name], PA-C  was physically located in a [facility] Urgent care site and [name] was located at a different location.   The limitations of evaluation and management by telemedicine as well as the availability of in-person appointments were discussed. Patient was informed that she  may incur a bill ( including co-pay) for this virtual visit encounter. Donna Hensley  expressed understanding and gave verbal consent to proceed with virtual visit.   [id number] 02/27/19 Arrival Time: [id number]  CC: Thrush  SUBJECTIVE: History from: patient.  Donna Hensley is a 53 y.o. female who presents with possible oral thrush x 1 day.  States she has been using her inhaler more often recently.  Has tried salt water gargles with minimal relief.  Denies aggravating factors.  Reports previous symptoms in the past and diagnosed with thrush.  Treated with [facility]'s magic mouthwash and diflucan with relief.   Denies fever, chills, fatigue, rhinorrhea, nasal congestion, dysphagia, odynophagia, sore throat, chest pain, nausea, vomiting.    ROS: As per HPI.  All other pertinent ROS negative.     Past Medical History:  Diagnosis Date  . Allergy   . Asthma   . GERD (gastroesophageal reflux disease)   . Gestational diabetes   . Hypertension    Past Surgical History:  Procedure Laterality Date  . APPENDECTOMY  1986  . [place]  . CHOLECYSTECTOMY  2013  . COLONOSCOPY WITH PROPOFOL N/A 05/05/2018   Procedure: COLONOSCOPY WITH PROPOFOL;  Surgeon: [name], MD;  Location:  [facility] ENDOSCOPY;  Service: Endoscopy;  Laterality: N/A;  . ESOPHAGOGASTRODUODENOSCOPY (EGD) WITH PROPOFOL N/A 05/05/2018   Procedure: ESOPHAGOGASTRODUODENOSCOPY (EGD) WITH PROPOFOL;  Surgeon: [name], MD;  Location: [facility] ENDOSCOPY;  Service: Endoscopy;  Laterality: N/A;  . TONSILLECTOMY  1987  . wisdom tooth removal     Allergies  Allergen Reactions  . Doxycycline Nausea And Vomiting  . Oxycodone Nausea And Vomiting  . Seasonal Ic [Cholestatin]    No current facility-administered medications on file prior to encounter.    Current Outpatient Medications on File Prior to Encounter  Medication Sig Dispense Refill  . albuterol (PROVENTIL HFA;VENTOLIN HFA) 108 (90 Base) MCG/ACT inhaler Inhale 2 puffs into the lungs every 6 (six) hours as needed for wheezing. 6.7 g 2  . BREO ELLIPTA 200-25 MCG/INH AEPB Inhale 1 puff into the lungs daily.     . Calcium-Magnesium-Vitamin D (CALCIUM 500 PO) Take 1 tablet by mouth daily.    . Cholecalciferol (VITAMIN D3) 1000 units CAPS Take 1 capsule by mouth daily.    . Cyanocobalamin (VITAMIN B 12 PO) Take 1 tablet by mouth daily.    . fluticasone (FLONASE) 50 MCG/ACT nasal spray USE 2 SPRAYS IN EACH NOSTRIL DAILY 48 g 3  . glucose blood test strip Use as instructed 200 each 3  . levocetirizine (XYZAL) 5 MG tablet Take 5 mg by mouth every evening.  3  . losartan (COZAAR) 50 MG tablet TAKE 1 TABLET DAILY 90 tablet 4  . metFORMIN (GLUCOPHAGE) 500 MG tablet TAKE 1 TABLET TWICE A DAY 180  tablet 4  . montelukast (SINGULAIR) 10 MG tablet TAKE 1 TABLET DAILY AT BEDTIME 90 tablet 3  . Multiple Vitamin (MULTIVITAMIN) tablet Take 1 tablet by mouth daily.    . pantoprazole (PROTONIX) 40 MG tablet TAKE 1 TABLET DAILY 90 tablet 3  . VITAMIN E PO Take by mouth daily.      OBJECTIVE:  There were no vitals filed for this visit.  General appearance: alert; no distress Eyes: EOMI grossly HENT: normocephalic; atraumatic; tolerating secretions without difficulty  Neck: supple with FROM Lungs: normal respiratory effort; speaking in full sentences without difficulty Extremities: moves extremities without difficulty Skin: No obvious rashes Neurologic: No facial asymmetries Psychological: alert and cooperative; normal mood and affect  ASSESSMENT & PLAN:  1. Thrush, oral     Meds ordered this encounter  Medications  . nystatin (MYCOSTATIN) 100000 UNIT/ML suspension    Sig: Take 5 mLs (500,000 Units total) by mouth 4 (four) times daily.    Dispense:  473 mL    Refill:  0    Order Specific Question:   Supervising Provider    Answer:   Raylene Everts [id number]     Nystatin prescribed.  Use as directed for between 10-14 days or until symptoms resolve Maintain oral hygiene Rinse mouth with salt water or mouth wash after inhaler use to prevent oral thrush secondary to inhaler use Follow up with PCP as needed Follow up in person or go to the ED if you have any new or worsening symptoms such as fever, chills, nausea, vomiting, difficulty swallowing, fatigue, lymph node swelling, unintentional weight loss, excessive night sweats, etc...  I discussed the assessment and treatment plan with the patient. The patient was provided an opportunity to ask questions and all were answered. The patient agreed with the plan and demonstrated an understanding of the instructions.   The patient was advised to call back or seek an in-person evaluation if the symptoms worsen or if the condition fails to improve as anticipated.  I provided 10 minutes of non-face-to-face time during this encounter.  [name], PA-C  02/27/2019 5:02 PM         [name], PA-C 02/27/19 1703

## 2019-02-27 NOTE — Discharge Instructions (Signed)
Nystatin prescribed.  Use as directed for between 10-14 days or until symptoms resolve Maintain oral hygiene Rinse mouth with salt water or mouth wash after inhaler use to prevent oral thrush secondary to inhaler use Follow up with PCP as needed Follow up in person or go to the ED if you have any new or worsening symptoms such as fever, chills, nausea, vomiting, difficulty swallowing, fatigue, lymph node swelling, unintentional weight loss, excessive night sweats, etc..[name]

## 2019-03-10 DIAGNOSIS — J3089 Other allergic rhinitis: Secondary | ICD-10-CM | POA: Diagnosis not present

## 2019-03-12 ENCOUNTER — Encounter: Payer: Self-pay | Admitting: Internal Medicine

## 2019-03-12 NOTE — Telephone Encounter (Signed)
Called pt to schedule virtual. Patient declined visit today. Scheduled virtual for tomorrow.

## 2019-03-13 ENCOUNTER — Other Ambulatory Visit: Payer: Self-pay

## 2019-03-13 ENCOUNTER — Ambulatory Visit (INDEPENDENT_AMBULATORY_CARE_PROVIDER_SITE_OTHER): Payer: BC Managed Care – PPO | Admitting: Internal Medicine

## 2019-03-13 ENCOUNTER — Encounter: Payer: Self-pay | Admitting: Internal Medicine

## 2019-03-13 DIAGNOSIS — E119 Type 2 diabetes mellitus without complications: Secondary | ICD-10-CM

## 2019-03-13 DIAGNOSIS — H109 Unspecified conjunctivitis: Secondary | ICD-10-CM

## 2019-03-13 MED ORDER — SULFACETAMIDE SODIUM 10 % OP SOLN: 1.0000 [drp] | Freq: Four times a day (QID) | OPHTHALMIC | 0 refills | Status: DC

## 2019-03-13 NOTE — Progress Notes (Signed)
Patient ID: Donna Hensley, female   DOB: October 21, 1965, 53 y.o.   MRN: [id number]   Virtual Visit via video Note  This visit type was conducted due to national recommendations for restrictions regarding the COVID-19 pandemic (e.g. social distancing).  This format is felt to be most appropriate for this patient at this time.  All issues noted in this document were discussed and addressed.  No physical exam was performed (except for noted visual exam findings with Video Visits).   I connected with Donna Hensley by a video enabled telemedicine application and verified that I am speaking with the correct person using two identifiers. Location patient: home Location provider: work  Persons participating in the virtual visit: patient, provider  I discussed the limitations, risks, security and privacy concerns of performing an evaluation and management service by video and the availability of in person appointments. The patient expressed understanding and agreed to proceed.   Reason for visit: work in appt  HPI: Work in appt with concerns regarding possible pink eye.  She reports that symptoms started approximately 5 days ago.  Thought was allergies.  Using allergy eye drops.  Noticed crusting.  Red eyes.  Some mucus drainage.  No pain in her eyes.  No vision loss. No fever.  No sinus congestion.  No sore throat.  No chest congestion or cough.  No sob.     ROS: See pertinent positives and negatives per HPI.  Past Medical History:  Diagnosis Date  . Allergy   . Asthma   . GERD (gastroesophageal reflux disease)   . Gestational diabetes   . Hypertension     Past Surgical History:  Procedure Laterality Date  . APPENDECTOMY  1986  . [place]  . CHOLECYSTECTOMY  2013  . COLONOSCOPY WITH PROPOFOL N/A 05/05/2018   Procedure: COLONOSCOPY WITH PROPOFOL;  Surgeon: [name], MD;  Location: [facility] ENDOSCOPY;  Service: Endoscopy;  Laterality: N/A;  .  ESOPHAGOGASTRODUODENOSCOPY (EGD) WITH PROPOFOL N/A 05/05/2018   Procedure: ESOPHAGOGASTRODUODENOSCOPY (EGD) WITH PROPOFOL;  Surgeon: [name], MD;  Location[facility] ENDOSCOPY;  Service: Endoscopy;  Laterality: N/A;  . TONSILLECTOMY  1987  . wisdom tooth removal      Family History  Problem Relation Age of Onset  . Hyperlipidemia Mother   . Osteoporosis Mother   . Hypertension Father   . Arthritis Father   . Sleep apnea Father   . Heart disease Father   . Sleep apnea Brother   . Arthritis Paternal Grandmother   . Diabetes Paternal Grandmother   . Breast cancer Neg Hx     SOCIAL HX: reviewed.    Current Outpatient Medications:  .  albuterol (PROVENTIL HFA;VENTOLIN HFA) 108 (90 Base) MCG/ACT inhaler, Inhale 2 puffs into the lungs every 6 (six) hours as needed for wheezing., Disp: 6.7 g, Rfl: 2 .  BREO ELLIPTA 200-25 MCG/INH AEPB, Inhale 1 puff into the lungs daily. , Disp: , Rfl:  .  Calcium-Magnesium-Vitamin D (CALCIUM 500 PO), Take 1 tablet by mouth daily., Disp: , Rfl:  .  Cholecalciferol (VITAMIN D3) 1000 units CAPS, Take 1 capsule by mouth daily., Disp: , Rfl:  .  Cyanocobalamin (VITAMIN B 12 PO), Take 1 tablet by mouth daily., Disp: , Rfl:  .  fluticasone (FLONASE) 50 MCG/ACT nasal spray, USE 2 SPRAYS IN EACH NOSTRIL DAILY, Disp: 48 g, Rfl: 3 .  glucose blood test strip, Use as instructed, Disp: 200 each, Rfl: 3 .  levocetirizine (XYZAL) 5  MG tablet, Take 5 mg by mouth every evening., Disp: , Rfl: 3 .  losartan (COZAAR) 50 MG tablet, TAKE 1 TABLET DAILY, Disp: 90 tablet, Rfl: 4 .  metFORMIN (GLUCOPHAGE) 500 MG tablet, TAKE 1 TABLET TWICE A DAY, Disp: 180 tablet, Rfl: 4 .  montelukast (SINGULAIR) 10 MG tablet, TAKE 1 TABLET DAILY AT BEDTIME, Disp: 90 tablet, Rfl: 3 .  Multiple Vitamin (MULTIVITAMIN) tablet, Take 1 tablet by mouth daily., Disp: , Rfl:  .  nystatin (MYCOSTATIN) 100000 UNIT/ML suspension, Take 5 mLs (500,000 Units total) by mouth 4 (four) times daily., Disp:  473 mL, Rfl: 0 .  pantoprazole (PROTONIX) 40 MG tablet, TAKE 1 TABLET DAILY, Disp: 90 tablet, Rfl: 3 .  VITAMIN E PO, Take by mouth daily., Disp: , Rfl:  .  sulfacetamide (BLEPH-10) 10 % ophthalmic solution, Place 1 drop into both eyes 4 (four) times daily., Disp: 15 mL, Rfl: 0  EXAM:  GENERAL: alert, oriented, appears well and in no acute distress  HEENT: atraumatic, conjunttiva clear, no obvious abnormalities on inspection of external nose and ears  NECK: normal movements of the head and neck  LUNGS: on inspection no signs of respiratory distress, breathing rate appears normal, no obvious gross SOB, gasping or wheezing  CV: no obvious cyanosis  PSYCH/NEURO: pleasant and cooperative, no obvious depression or anxiety, speech and thought processing grossly intact  ASSESSMENT AND PLAN:  Discussed the following assessment and plan:  Conjunctivitis Redness and crusting of eyes.  Persistent.  Treat with sulamyd eye drops.  Follow.  Notify me or be reevaluated - if any worsening symptoms or problems.    Diabetes Reports sugars doing better.  Follow.      I discussed the assessment and treatment plan with the patient. The patient was provided an opportunity to ask questions and all were answered. The patient agreed with the plan and demonstrated an understanding of the instructions.   The patient was advised to call back or seek an in-person evaluation if the symptoms worsen or if the condition fails to improve as anticipated.   Einar Pheasant, MD

## 2019-03-15 ENCOUNTER — Encounter: Payer: Self-pay | Admitting: Internal Medicine

## 2019-03-15 DIAGNOSIS — H109 Unspecified conjunctivitis: Secondary | ICD-10-CM | POA: Insufficient documentation

## 2019-03-15 NOTE — Assessment & Plan Note (Signed)
Reports sugars doing better.  Follow.

## 2019-03-15 NOTE — Assessment & Plan Note (Signed)
Redness and crusting of eyes.  Persistent.  Treat with sulamyd eye drops.  Follow.  Notify me or be reevaluated - if any worsening symptoms or problems.

## 2019-03-19 ENCOUNTER — Telehealth: Payer: Self-pay

## 2019-03-19 DIAGNOSIS — L989 Disorder of the skin and subcutaneous tissue, unspecified: Secondary | ICD-10-CM

## 2019-03-19 DIAGNOSIS — J3089 Other allergic rhinitis: Secondary | ICD-10-CM | POA: Diagnosis not present

## 2019-03-19 NOTE — Telephone Encounter (Signed)
Order placed for dermatology referral.  Please schedule asap - pt states more inflamed.

## 2019-03-19 NOTE — Telephone Encounter (Signed)
Copied from [place] [id number]. Topic: Appointment Scheduling - Scheduling Inquiry for Clinic >> Mar 19, 2019  8:51 AM Richardo Priest, NT wrote: Reason for CRM: Pt called in stating she is needing to either have an appointment ASAP in person or have a referral sent in regards to cyst on back. Pt is very scared it is starting to get infected and has heard nothing in regards to referral. Do not see referral on file. Please advise.

## 2019-03-19 NOTE — Addendum Note (Signed)
Addended by: Alisa Graff on: 03/19/2019 01:01 PM   Modules accepted: Orders

## 2019-03-19 NOTE — Telephone Encounter (Signed)
Patient says that she mentioned this at her last video visit. She is requesting referral to dermatology and appt with them to look at the cyst on her back. Confirmed pt doing okay. She did not feel that she needed to be seen in office today. The cyst is inflamed more than it was. Advised we would follow up and see if we can get her in with dermatology.

## 2019-03-20 ENCOUNTER — Encounter: Payer: Self-pay | Admitting: Internal Medicine

## 2019-03-20 ENCOUNTER — Other Ambulatory Visit: Payer: Self-pay | Admitting: Internal Medicine

## 2019-03-20 MED ORDER — AMOXICILLIN-POT CLAVULANATE 875-125 MG PO TABS: 1.0000 | ORAL_TABLET | Freq: Two times a day (BID) | ORAL | 0 refills | Status: DC

## 2019-03-20 NOTE — Addendum Note (Signed)
Addended by: Alisa Graff on: 03/20/2019 05:31 PM   Modules accepted: Orders

## 2019-03-20 NOTE — Telephone Encounter (Signed)
Reviewed picture.  Spoke to pt.  Warm compresses.  rx sent in for augmentin.  Instructed to take probiotic as directed.  F/u with derm.

## 2019-03-20 NOTE — Telephone Encounter (Signed)
Notify pt that I spoke to our referral coordinator and she has put the referral as urgent and is following up with dermatology.  Recommend warm compresses.  Also if red and hot, may need abx.  Can she send a picture.  d

## 2019-03-20 NOTE — Telephone Encounter (Signed)
Patient will send picture sent my chart and called patient, she says the area is draining yellow pus and slight pink tinge fid , feels irritated but she cannot reach it to se if its hot to touch will have husband take picture and send.

## 2019-03-24 DIAGNOSIS — J3089 Other allergic rhinitis: Secondary | ICD-10-CM | POA: Diagnosis not present

## 2019-03-24 DIAGNOSIS — L720 Epidermal cyst: Secondary | ICD-10-CM | POA: Diagnosis not present

## 2019-03-24 DIAGNOSIS — L0291 Cutaneous abscess, unspecified: Secondary | ICD-10-CM | POA: Diagnosis not present

## 2019-03-31 ENCOUNTER — Telehealth: Payer: Self-pay | Admitting: *Deleted

## 2019-03-31 NOTE — Telephone Encounter (Signed)
LMTCB to see why patient thinks she has a staph infection or what exactly is going on. I only have patient allergic to doxy which causes nausea & vomiting.

## 2019-03-31 NOTE — Telephone Encounter (Signed)
Copied from [place] [phone]. Topic: General - Other >> Mar 31, 2019 11:21 AM Celene Kras wrote: Reason for CRM: Pt called stating she is needing to have someone call her regarding her allergy list. Pt states she does not recall having a reaction to 2/3 of the medications listed and is needing this corrected so that she can have an antibiotic written for her to prevent staph infection. Please advise.

## 2019-04-02 DIAGNOSIS — J3089 Other allergic rhinitis: Secondary | ICD-10-CM | POA: Diagnosis not present

## 2019-04-04 ENCOUNTER — Ambulatory Visit
Admission: EM | Admit: 2019-04-04 | Discharge: 2019-04-04 | Disposition: A | Discharge status: Home / Self Care | Payer: BC Managed Care – PPO | Attending: Family Medicine | Admitting: Family Medicine

## 2019-04-04 ENCOUNTER — Encounter: Payer: Self-pay | Admitting: Emergency Medicine

## 2019-04-04 ENCOUNTER — Other Ambulatory Visit: Payer: Self-pay

## 2019-04-04 DIAGNOSIS — H5789 Other specified disorders of eye and adnexa: Secondary | ICD-10-CM

## 2019-04-04 DIAGNOSIS — H1013 Acute atopic conjunctivitis, bilateral: Secondary | ICD-10-CM | POA: Diagnosis not present

## 2019-04-04 MED ORDER — PREDNISONE 10 MG (21) PO TBPK: ORAL_TABLET | ORAL | 0 refills | Status: DC

## 2019-04-04 NOTE — ED Triage Notes (Signed)
Patient in today c/o bilateral eye redness and swelling x 2 days. Patient did a video visit for same ~2 weeks ago and was given Sulfacetamide x 1 week. Patient also takes Azelastine eye drops for allergies.

## 2019-04-04 NOTE — ED Provider Notes (Signed)
[facility]    CSN: [id number] Arrival date & time: 04/04/19  1301  History   Chief Complaint Chief Complaint  Patient presents with  . eye redness  . Facial Swelling    HPI  53 year old female presents with the above complaints.  Patient reports that she has had ongoing issues with her eyes.  She was seen by her primary care provider on 11/13.  Was diagnosed with conjunctivitis.  Was treated with antibiotic eyedrops.  She states that symptoms improved but then worsened again as of yesterday.  She reports eye watering and periorbital erythema and edema.  She has an extensive history of allergies.  She is on multiple medications as well as injections.  No relieving factors.  No other associated symptoms.    PMH, Surgical Hx, Family Hx, Social History reviewed and updated as below.  Past Medical History:  Diagnosis Date  . Allergy   . Asthma   . GERD (gastroesophageal reflux disease)   . Gestational diabetes   . Hypertension     Patient Active Problem List   Diagnosis Date Noted  . Conjunctivitis 03/15/2019  . Left otitis media 06/23/2015  . Essential hypertension 04/28/2015  . Respiratory tract infection 04/18/2015  . Health care maintenance 02/05/2015  . Diabetes ([place]) 09/06/2013  . Essential hypertension, benign 11/15/2012  . Iron deficiency 10/05/2012  . Mild intermittent asthma in adult without complication [id number]  . HYPERTHYROIDISM, SUBCLINICAL 08/24/2008  . Allergic rhinitis 08/24/2008  . GERD 08/24/2008    Past Surgical History:  Procedure Laterality Date  . APPENDECTOMY  1986  . [place]  . CHOLECYSTECTOMY  2013  . COLONOSCOPY WITH PROPOFOL N/A 05/05/2018   Procedure: COLONOSCOPY WITH PROPOFOL;  Surgeon: [name], MD;  Location: [facility] ENDOSCOPY;  Service: Endoscopy;  Laterality: N/A;  . ESOPHAGOGASTRODUODENOSCOPY (EGD) WITH PROPOFOL N/A 05/05/2018   Procedure: ESOPHAGOGASTRODUODENOSCOPY (EGD) WITH PROPOFOL;   Surgeon: [name], MD;  Location: [facility] ENDOSCOPY;  Service: Endoscopy;  Laterality: N/A;  . TONSILLECTOMY  1987  . wisdom tooth removal      OB History   No obstetric history on file.      Home Medications    Prior to Admission medications   Medication Sig Start Date End Date Taking? Authorizing Provider  albuterol (PROVENTIL HFA;VENTOLIN HFA) 108 (90 Base) MCG/ACT inhaler Inhale 2 puffs into the lungs every 6 (six) hours as needed for wheezing. 05/30/16  Yes Arnett, Yvetta Coder, FNP  azelastine (OPTIVAR) 0.05 % ophthalmic solution Place 1 drop into both eyes daily.   Yes [provider]  BREO ELLIPTA 200-25 MCG/INH AEPB Inhale 1 puff into the lungs daily.  12/29/15  Yes [provider]  Calcium-Magnesium-Vitamin D (CALCIUM 500 PO) Take 1 tablet by mouth daily.   Yes [provider]  Cholecalciferol (VITAMIN D3) 1000 units CAPS Take 1 capsule by mouth daily.   Yes [provider]  Cyanocobalamin (VITAMIN B 12 PO) Take 1 tablet by mouth daily.   Yes [provider]  fluticasone (FLONASE) 50 MCG/ACT nasal spray USE 2 SPRAYS IN EACH NOSTRIL DAILY 10/18/16  Yes Einar Pheasant, MD  glucose blood test strip Use as instructed 12/19/16  Yes Einar Pheasant, MD  levocetirizine (XYZAL) 5 MG tablet Take 5 mg by mouth every evening. 02/08/16  Yes [provider]  losartan (COZAAR) 50 MG tablet TAKE 1 TABLET DAILY 03/20/19  Yes Einar Pheasant, MD  metFORMIN (GLUCOPHAGE) 500 MG tablet TAKE 1 TABLET TWICE A  DAY 05/27/18  Yes Einar Pheasant, MD  montelukast (SINGULAIR) 10 MG tablet TAKE 1 TABLET DAILY AT BEDTIME 09/29/18  Yes Einar Pheasant, MD  Multiple Vitamin (MULTIVITAMIN) tablet Take 1 tablet by mouth daily.   Yes [provider]  pantoprazole (PROTONIX) 40 MG tablet TAKE 1 TABLET DAILY 11/10/18  Yes Einar Pheasant, MD  sulfacetamide (BLEPH-10) 10 % ophthalmic solution Place 1 drop into both eyes 4 (four) times daily. 03/13/19  Yes  Einar Pheasant, MD  VITAMIN E PO Take by mouth daily.   Yes [provider]  predniSONE (STERAPRED UNI-PAK 21 TAB) 10 MG (21) TBPK tablet 6 tablets on day 1; decrease by 1 tablet daily until gone. 04/04/19   Coral Spikes, DO    Family History Family History  Problem Relation Age of Onset  . Hyperlipidemia Mother   . Osteoporosis Mother   . Hypertension Father   . Arthritis Father   . Sleep apnea Father   . Heart disease Father   . Sleep apnea Brother   . Arthritis Paternal Grandmother   . Diabetes Paternal Grandmother   . Breast cancer Neg Hx     Social History Social History   Tobacco Use  . Smoking status: Former Smoker    Packs/day: 0.25    Years: 5.00    Pack years: 1.25    Types: Cigarettes    Quit date: 09/28/1988    Years since quitting: 30.5  . Smokeless tobacco: Never Used  . Tobacco comment: smoked briefly as a teenager  Substance Use Topics  . Alcohol use: Yes    Alcohol/week: 1.0 standard drinks    Types: 1 Glasses of wine per week    Comment: Occasional 1-2 per month  . Drug use: Never     Allergies   Doxycycline, Oxycodone, and Seasonal ic [cholestatin]   Review of Systems Review of Systems  Constitutional: Negative.   Eyes:       Eye watering, redness, periorbital edema.   Physical Exam Triage Vital Signs ED Triage Vitals [04/04/19 1322]  Enc Vitals Group     BP (!) 156/92     Pulse Rate 85     Resp 18     Temp 98.3 F (36.8 C)     Temp Source Oral     SpO2 100 %     Weight 210 lb (95.3 kg)     Height 5\' 2"  (1.575 m)     Head Circumference      Peak Flow      Pain Score 0     Pain Loc      Pain Edu?      Excl. in [place]?    Updated Vital Signs BP (!) 156/92 (BP Location: Left Arm)   Pulse 85   Temp 98.3 F (36.8 C) (Oral)   Resp 18   Ht 5\' 2"  ([id number] m)   Wt 95.3 kg   LMP 07/17/2017   SpO2 100%   BMI 38.41 kg/m   Visual Acuity Right Eye Distance: 20/25(corrected) Left Eye Distance: 20/25(corrected) Bilateral  Distance: 20/25(corrected)  Right Eye Near:   Left Eye Near:    Bilateral Near:     Physical Exam Vitals signs and nursing note reviewed.  Constitutional:      General: She is not in acute distress.    Appearance: Normal appearance. She is not ill-appearing.  HENT:     Head: Normocephalic and atraumatic.     Nose: Nose normal.  Eyes:  Comments: Normal conjunctiva bilaterally.  Mild erythema and swelling of the upper eyelids bilaterally, left greater than right.  Cardiovascular:     Rate and Rhythm: Normal rate and regular rhythm.     Heart sounds: No murmur.  Pulmonary:     Effort: Pulmonary effort is normal.     Breath sounds: No wheezing, rhonchi or rales.  Neurological:     Mental Status: She is alert.  Psychiatric:        Mood and Affect: Mood normal.        Behavior: Behavior normal.    UC Treatments / Results  Labs (all labs ordered are listed, but only abnormal results are displayed) Labs Reviewed - No data to display  EKG   Radiology No results found.  Procedures Procedures (including critical care time)  Medications Ordered in UC Medications - No data to display  Initial Impression / Assessment and Plan / UC Course  I have reviewed the triage vital signs and the nursing notes.  Pertinent labs & imaging results that were available during my care of the patient were reviewed by me and considered in my medical decision making (see chart for details).    53 year old female presents with allergic conjunctivitis and associated periorbital erythema and swelling.  I suspect that this is all allergic in nature.  Advised continued use of her home medications.  Short burst of prednisone.  Final Clinical Impressions(s) / UC Diagnoses   Final diagnoses:  Allergic conjunctivitis of both eyes  Periorbital swelling     Discharge Instructions     Continue home medications.  Steroid as prescribed.  Take care  Dr. Lacinda Axon    ED Prescriptions     Medication Sig Dispense Auth. Provider   predniSONE (STERAPRED UNI-PAK 21 TAB) 10 MG (21) TBPK tablet 6 tablets on day 1; decrease by 1 tablet daily until gone. 21 tablet Thersa Salt G, DO     PDMP not reviewed this encounter.   Coral Spikes, [place] 04/04/19 1423

## 2019-04-04 NOTE — Discharge Instructions (Signed)
Continue home medications.  Steroid as prescribed.  Take care  Dr. Lacinda Axon

## 2019-04-06 DIAGNOSIS — B957 Other staphylococcus as the cause of diseases classified elsewhere: Secondary | ICD-10-CM | POA: Diagnosis not present

## 2019-04-06 DIAGNOSIS — L720 Epidermal cyst: Secondary | ICD-10-CM | POA: Diagnosis not present

## 2019-04-09 DIAGNOSIS — J3089 Other allergic rhinitis: Secondary | ICD-10-CM | POA: Diagnosis not present

## 2019-04-14 ENCOUNTER — Encounter: Payer: Self-pay | Admitting: Internal Medicine

## 2019-04-14 MED ORDER — CLOTRIMAZOLE 10 MG MT TROC: OROMUCOSAL | 0 refills | Status: DC

## 2019-04-14 NOTE — Telephone Encounter (Signed)
Prescription for mycelex troches sent in to pharmacy

## 2019-04-16 DIAGNOSIS — J3089 Other allergic rhinitis: Secondary | ICD-10-CM | POA: Diagnosis not present

## 2019-04-21 DIAGNOSIS — H02844 Edema of left upper eyelid: Secondary | ICD-10-CM | POA: Diagnosis not present

## 2019-04-21 DIAGNOSIS — H02841 Edema of right upper eyelid: Secondary | ICD-10-CM | POA: Diagnosis not present

## 2019-04-21 DIAGNOSIS — J3089 Other allergic rhinitis: Secondary | ICD-10-CM | POA: Diagnosis not present

## 2019-04-28 DIAGNOSIS — J3089 Other allergic rhinitis: Secondary | ICD-10-CM | POA: Diagnosis not present

## 2019-05-07 DIAGNOSIS — J3089 Other allergic rhinitis: Secondary | ICD-10-CM | POA: Diagnosis not present

## 2019-05-14 DIAGNOSIS — J3089 Other allergic rhinitis: Secondary | ICD-10-CM | POA: Diagnosis not present

## 2019-05-21 DIAGNOSIS — J3089 Other allergic rhinitis: Secondary | ICD-10-CM | POA: Diagnosis not present

## 2019-05-22 DIAGNOSIS — L209 Atopic dermatitis, unspecified: Secondary | ICD-10-CM | POA: Diagnosis not present

## 2019-05-22 DIAGNOSIS — Z1283 Encounter for screening for malignant neoplasm of skin: Secondary | ICD-10-CM | POA: Diagnosis not present

## 2019-05-22 DIAGNOSIS — D225 Melanocytic nevi of trunk: Secondary | ICD-10-CM | POA: Diagnosis not present

## 2019-05-22 DIAGNOSIS — H01134 Eczematous dermatitis of left upper eyelid: Secondary | ICD-10-CM | POA: Diagnosis not present

## 2019-05-22 DIAGNOSIS — D224 Melanocytic nevi of scalp and neck: Secondary | ICD-10-CM | POA: Diagnosis not present

## 2019-05-22 DIAGNOSIS — L821 Other seborrheic keratosis: Secondary | ICD-10-CM | POA: Diagnosis not present

## 2019-05-25 DIAGNOSIS — J3089 Other allergic rhinitis: Secondary | ICD-10-CM | POA: Diagnosis not present

## 2019-05-29 DIAGNOSIS — J3089 Other allergic rhinitis: Secondary | ICD-10-CM | POA: Diagnosis not present

## 2019-06-05 ENCOUNTER — Other Ambulatory Visit (INDEPENDENT_AMBULATORY_CARE_PROVIDER_SITE_OTHER): Payer: BC Managed Care – PPO

## 2019-06-05 ENCOUNTER — Other Ambulatory Visit: Payer: Self-pay

## 2019-06-05 DIAGNOSIS — I10 Essential (primary) hypertension: Secondary | ICD-10-CM | POA: Diagnosis not present

## 2019-06-05 DIAGNOSIS — R7989 Other specified abnormal findings of blood chemistry: Secondary | ICD-10-CM

## 2019-06-05 DIAGNOSIS — J3089 Other allergic rhinitis: Secondary | ICD-10-CM | POA: Diagnosis not present

## 2019-06-05 DIAGNOSIS — E119 Type 2 diabetes mellitus without complications: Secondary | ICD-10-CM

## 2019-06-05 DIAGNOSIS — R945 Abnormal results of liver function studies: Secondary | ICD-10-CM

## 2019-06-05 LAB — LIPID PANEL
Cholesterol: 155 mg/dL (ref 0–200)
HDL: 54.3 mg/dL (ref >39.00)
LDL Cholesterol: 89 mg/dL (ref 0–99)
NonHDL: 100.21
Total CHOL/HDL Ratio: 3
Triglycerides: 58 mg/dL (ref 0.0–149.0)
VLDL: 11.6 mg/dL (ref 0.0–40.0)

## 2019-06-05 LAB — HEMOGLOBIN A1C: Hgb A1c MFr Bld: 7.3 % — ABNORMAL HIGH (ref 4.6–6.5)

## 2019-06-05 LAB — HEPATIC FUNCTION PANEL
ALT: 32 U/L (ref 0–35)
AST: 26 U/L (ref 0–37)
Albumin: 4 g/dL (ref 3.5–5.2)
Alkaline Phosphatase: 104 U/L (ref 39–117)
Bilirubin, Direct: 0.2 mg/dL (ref 0.0–0.3)
Total Bilirubin: 0.6 mg/dL (ref 0.2–1.2)
Total Protein: 6.9 g/dL (ref 6.0–8.3)

## 2019-06-05 LAB — BASIC METABOLIC PANEL
BUN: 13 mg/dL (ref 6–23)
CO2: 26 mEq/L (ref 19–32)
Calcium: 9.5 mg/dL (ref 8.4–10.5)
Chloride: 103 mEq/L (ref 96–112)
Creatinine, Ser: 0.9 mg/dL (ref 0.40–1.20)
GFR: 65.97 mL/min (ref >60.00)
Glucose, Bld: 140 mg/dL — ABNORMAL HIGH (ref 70–99)
Potassium: 4 mEq/L (ref 3.5–5.1)
Sodium: 140 mEq/L (ref 135–145)

## 2019-06-05 LAB — CBC WITH DIFFERENTIAL/PLATELET
Basophils Absolute: 0.1 10*3/uL (ref 0.0–0.1)
Basophils Relative: 0.8 % (ref 0.0–3.0)
Eosinophils Absolute: 0.3 10*3/uL (ref 0.0–0.7)
Eosinophils Relative: 3.7 % (ref 0.0–5.0)
HCT: 44.6 % (ref 36.0–46.0)
Hemoglobin: 14.7 g/dL (ref 12.0–15.0)
Lymphocytes Relative: 33.3 % (ref 12.0–46.0)
Lymphs Abs: 2.9 10*3/uL (ref 0.7–4.0)
MCHC: 33 g/dL (ref 30.0–36.0)
MCV: 85.7 fl (ref 78.0–100.0)
Monocytes Absolute: 0.7 10*3/uL (ref 0.1–1.0)
Monocytes Relative: 7.9 % (ref 3.0–12.0)
Neutro Abs: 4.7 10*3/uL (ref 1.4–7.7)
Neutrophils Relative %: 54.3 % (ref 43.0–77.0)
Platelets: 312 10*3/uL (ref 150.0–400.0)
RBC: 5.2 Mil/uL — ABNORMAL HIGH (ref 3.87–5.11)
RDW: 14.7 % (ref 11.5–15.5)
WBC: 8.7 10*3/uL (ref 4.0–10.5)

## 2019-06-09 ENCOUNTER — Other Ambulatory Visit: Payer: Self-pay

## 2019-06-09 ENCOUNTER — Ambulatory Visit (INDEPENDENT_AMBULATORY_CARE_PROVIDER_SITE_OTHER): Payer: BC Managed Care – PPO | Admitting: Internal Medicine

## 2019-06-09 DIAGNOSIS — I10 Essential (primary) hypertension: Secondary | ICD-10-CM | POA: Diagnosis not present

## 2019-06-09 DIAGNOSIS — H5789 Other specified disorders of eye and adnexa: Secondary | ICD-10-CM | POA: Diagnosis not present

## 2019-06-09 DIAGNOSIS — J309 Allergic rhinitis, unspecified: Secondary | ICD-10-CM | POA: Diagnosis not present

## 2019-06-09 DIAGNOSIS — K219 Gastro-esophageal reflux disease without esophagitis: Secondary | ICD-10-CM

## 2019-06-09 DIAGNOSIS — J4520 Mild intermittent asthma, uncomplicated: Secondary | ICD-10-CM

## 2019-06-09 DIAGNOSIS — E1165 Type 2 diabetes mellitus with hyperglycemia: Secondary | ICD-10-CM

## 2019-06-09 MED ORDER — FLUCONAZOLE 150 MG PO TABS: ORAL_TABLET | ORAL | 0 refills | Status: DC

## 2019-06-09 MED ORDER — METFORMIN HCL 1000 MG PO TABS: 1000.0000 mg | ORAL_TABLET | Freq: Two times a day (BID) | ORAL | 1 refills | Status: DC

## 2019-06-09 NOTE — Progress Notes (Addendum)
Patient ID: Donna Hensley, female   DOB: 1965/06/19, 54 y.o.   MRN: [id number]   Virtual Visit via video Note  This visit type was conducted due to national recommendations for restrictions regarding the COVID-19 pandemic (e.g. social distancing).  This format is felt to be most appropriate for this patient at this time.  All issues noted in this document were discussed and addressed.  No physical exam was performed (except for noted visual exam findings with Video Visits).   I connected with Chrissy Eduardo by a video enabled telemedicine application and verified that I am speaking with the correct person using two identifiers. Location patient: home Location provider: work  Persons participating in the virtual visit: patient, provider  The limitations, risks, security and privacy concerns of performing an evaluation and management service by telephone and the availability of in person appointments have been discussed.  The patient expressed understanding and agreed to proceed.   Reason for visit: scheduled follow up.   HPI: She reports she is doing relatively well.  Increased redness, irritation and swelling - eye/eye lid.  Has seen dermatology, ophthalmology.  Has tried topical creams (eucrisa, etc).  Tried pataday.  Had prednisone, sulamyd.  Will improve temporarily and then symptoms return.  Discussed f/u with allergist.  Discussed recent labs. Elevated a1c.  Discussed increasing metformin to 1000mg  bid.  Discussed low carb diet and exercise.  No chest pain or sob reported.  Breathing doing well.  Not needing rescue inhaler.  Blood pressures averaging 120-130/80-90.  No abdominal pain or bowel change reported.  Had colonoscopy 04/2018.  Recommended f/u in 3 years.  Handling stress.     ROS: See pertinent positives and negatives per HPI.  Past Medical History:  Diagnosis Date  . Allergy   . Asthma   . GERD (gastroesophageal reflux disease)   . Gestational diabetes   . Hypertension      Past Surgical History:  Procedure Laterality Date  . APPENDECTOMY  1986  . [place]  . CHOLECYSTECTOMY  2013  . COLONOSCOPY WITH PROPOFOL N/A 05/05/2018   Procedure: COLONOSCOPY WITH PROPOFOL;  Surgeon: [name], MD;  Location: [facility] ENDOSCOPY;  Service: Endoscopy;  Laterality: N/A;  . ESOPHAGOGASTRODUODENOSCOPY (EGD) WITH PROPOFOL N/A 05/05/2018   Procedure: ESOPHAGOGASTRODUODENOSCOPY (EGD) WITH PROPOFOL;  Surgeon: [name], MD;  Location: [facility] ENDOSCOPY;  Service: Endoscopy;  Laterality: N/A;  . TONSILLECTOMY  1987  . wisdom tooth removal      Family History  Problem Relation Age of Onset  . Hyperlipidemia Mother   . Osteoporosis Mother   . Hypertension Father   . Arthritis Father   . Sleep apnea Father   . Heart disease Father   . Sleep apnea Brother   . Arthritis Paternal Grandmother   . Diabetes Paternal Grandmother   . Breast cancer Neg Hx     SOCIAL HX: reviewed.    Current Outpatient Medications:  .  albuterol (PROVENTIL HFA;VENTOLIN HFA) 108 (90 Base) MCG/ACT inhaler, Inhale 2 puffs into the lungs every 6 (six) hours as needed for wheezing., Disp: 6.7 g, Rfl: 2 .  azelastine (OPTIVAR) 0.05 % ophthalmic solution, Place 1 drop into both eyes daily., Disp: , Rfl:  .  BREO ELLIPTA 200-25 MCG/INH AEPB, Inhale 1 puff into the lungs daily. , Disp: , Rfl:  .  Calcium-Magnesium-Vitamin D (CALCIUM 500 PO), Take 1 tablet by mouth daily., Disp: , Rfl:  .  Cholecalciferol (VITAMIN D3) 1000 units CAPS, Take  1 capsule by mouth daily., Disp: , Rfl:  .  clotrimazole (MYCELEX) 10 MG troche, One troche tid prn, Disp: 35 Troche, Rfl: 0 .  Cyanocobalamin (VITAMIN B 12 PO), Take 1 tablet by mouth daily., Disp: , Rfl:  .  fluconazole (DIFLUCAN) 150 MG tablet, Take one tablet x 1 dose.  If no improvement, then may repeat x 1 in 3 days., Disp: 2 tablet, Rfl: 0 .  fluticasone (FLONASE) 50 MCG/ACT nasal spray, USE 2 SPRAYS IN EACH NOSTRIL DAILY, Disp: 48  g, Rfl: 3 .  glucose blood test strip, Use as instructed, Disp: 200 each, Rfl: 3 .  levocetirizine (XYZAL) 5 MG tablet, Take 5 mg by mouth every evening., Disp: , Rfl: 3 .  losartan (COZAAR) 50 MG tablet, TAKE 1 TABLET DAILY, Disp: 90 tablet, Rfl: 3 .  metFORMIN (GLUCOPHAGE) 1000 MG tablet, Take 1 tablet (1,000 mg total) by mouth 2 (two) times daily with a meal., Disp: 180 tablet, Rfl: 1 .  montelukast (SINGULAIR) 10 MG tablet, TAKE 1 TABLET DAILY AT BEDTIME, Disp: 90 tablet, Rfl: 3 .  Multiple Vitamin (MULTIVITAMIN) tablet, Take 1 tablet by mouth daily., Disp: , Rfl:  .  pantoprazole (PROTONIX) 40 MG tablet, TAKE 1 TABLET DAILY, Disp: 90 tablet, Rfl: 3 .  predniSONE (STERAPRED UNI-PAK 21 TAB) 10 MG (21) TBPK tablet, 6 tablets on day 1; decrease by 1 tablet daily until gone., Disp: 21 tablet, Rfl: 0 .  sulfacetamide (BLEPH-10) 10 % ophthalmic solution, Place 1 drop into both eyes 4 (four) times daily., Disp: 15 mL, Rfl: 0 .  VITAMIN E PO, Take by mouth daily., Disp: , Rfl:   EXAM:  VITALS per patient if applicable:  0000000  GENERAL: alert, oriented, appears well and in no acute distress  HEENT: atraumatic, eyelid - red, some swelling, no surrounding erythema. no obvious abnormalities on inspection of external nose and ears  NECK: normal movements of the head and neck  LUNGS: on inspection no signs of respiratory distress, breathing rate appears normal, no obvious gross SOB, gasping or wheezing  CV: no obvious cyanosis  PSYCH/NEURO: pleasant and cooperative, no obvious depression or anxiety, speech and thought processing grossly intact  ASSESSMENT AND PLAN:  Discussed the following assessment and plan:  Allergic rhinitis Sees an allergist.  Has done well on current regimen.    Diabetes Elevated a1c.  Discussed low carb diet and exercise.  Increase metformin to 1000mg  bid.  Follow sugars.  Follow metabolic panel.   Essential hypertension Blood pressure as outlined.   Continue current medication regimen.  Follow pressures.  Follow metabolic panel.   GERD No acid reflux reported.  Follow.   Mild intermittent asthma in adult without complication Breathing doing well.  Follow.    Eye irritation Persistent.  Has tried eye drops, steroids, creams and pataday.  Persistent swelling and redness.  Have her f/u with her allergist.  She will call and schedule appt.    Orders Placed This Encounter  Procedures  . Hemoglobin A1c    Standing Status:   Future    Standing Expiration Date:   06/12/2020  . Hepatic function panel    Standing Status:   Future    Standing Expiration Date:   06/12/2020  . Lipid panel    Standing Status:   Future    Standing Expiration Date:   06/12/2020  . Basic metabolic panel    Standing Status:   Future    Standing Expiration Date:   06/12/2020  Meds ordered this encounter  Medications  . metFORMIN (GLUCOPHAGE) 1000 MG tablet    Sig: Take 1 tablet (1,000 mg total) by mouth 2 (two) times daily with a meal.    Dispense:  180 tablet    Refill:  1  . fluconazole (DIFLUCAN) 150 MG tablet    Sig: Take one tablet x 1 dose.  If no improvement, then may repeat x 1 in 3 days.    Dispense:  2 tablet    Refill:  0     I discussed the assessment and treatment plan with the patient. The patient was provided an opportunity to ask questions and all were answered. The patient agreed with the plan and demonstrated an understanding of the instructions.   The patient was advised to call back or seek an in-person evaluation if the symptoms worsen or if the condition fails to improve as anticipated.   Einar Pheasant, MD

## 2019-06-12 DIAGNOSIS — J3089 Other allergic rhinitis: Secondary | ICD-10-CM | POA: Diagnosis not present

## 2019-06-13 ENCOUNTER — Encounter: Payer: Self-pay | Admitting: Internal Medicine

## 2019-06-13 DIAGNOSIS — H5789 Other specified disorders of eye and adnexa: Secondary | ICD-10-CM | POA: Insufficient documentation

## 2019-06-13 NOTE — Assessment & Plan Note (Signed)
Elevated a1c.  Discussed low carb diet and exercise.  Increase metformin to 1000mg  bid.  Follow sugars.  Follow metabolic panel.

## 2019-06-13 NOTE — Assessment & Plan Note (Signed)
Persistent.  Has tried eye drops, steroids, creams and pataday.  Persistent swelling and redness.  Have her f/u with her allergist.  She will call and schedule appt.

## 2019-06-13 NOTE — Assessment & Plan Note (Signed)
Blood pressure as outlined.  Continue current medication regimen.  Follow pressures.  Follow metabolic panel.  

## 2019-06-13 NOTE — Assessment & Plan Note (Signed)
No acid reflux reported.  Follow.    

## 2019-06-13 NOTE — Assessment & Plan Note (Signed)
Breathing doing well.  Follow.

## 2019-06-13 NOTE — Assessment & Plan Note (Signed)
Sees an allergist.  Has done well on current regimen.

## 2019-06-19 DIAGNOSIS — J3089 Other allergic rhinitis: Secondary | ICD-10-CM | POA: Diagnosis not present

## 2019-06-26 ENCOUNTER — Ambulatory Visit
Admission: RE | Admit: 2019-06-26 | Discharge: 2019-06-26 | Disposition: A | Discharge status: Home / Self Care | Payer: BC Managed Care – PPO | Source: Ambulatory Visit | Attending: Internal Medicine | Admitting: Internal Medicine

## 2019-06-26 DIAGNOSIS — Z1231 Encounter for screening mammogram for malignant neoplasm of breast: Secondary | ICD-10-CM | POA: Diagnosis not present

## 2019-06-26 DIAGNOSIS — J3089 Other allergic rhinitis: Secondary | ICD-10-CM | POA: Diagnosis not present

## 2019-06-27 ENCOUNTER — Other Ambulatory Visit: Payer: Self-pay | Admitting: Internal Medicine

## 2019-06-27 DIAGNOSIS — R928 Other abnormal and inconclusive findings on diagnostic imaging of breast: Secondary | ICD-10-CM

## 2019-06-27 NOTE — Progress Notes (Signed)
Orders placed for f/u left breast mammogram and ultrasound 

## 2019-07-02 ENCOUNTER — Encounter: Payer: Self-pay | Admitting: Internal Medicine

## 2019-07-02 DIAGNOSIS — J3089 Other allergic rhinitis: Secondary | ICD-10-CM | POA: Diagnosis not present

## 2019-07-03 ENCOUNTER — Ambulatory Visit
Admission: RE | Admit: 2019-07-03 | Discharge: 2019-07-03 | Disposition: A | Discharge status: Home / Self Care | Payer: BC Managed Care – PPO | Source: Ambulatory Visit | Attending: Internal Medicine | Admitting: Internal Medicine

## 2019-07-03 ENCOUNTER — Other Ambulatory Visit: Payer: Self-pay

## 2019-07-03 DIAGNOSIS — R928 Other abnormal and inconclusive findings on diagnostic imaging of breast: Secondary | ICD-10-CM | POA: Insufficient documentation

## 2019-07-04 ENCOUNTER — Encounter: Payer: Self-pay | Admitting: Internal Medicine

## 2019-07-09 DIAGNOSIS — J3089 Other allergic rhinitis: Secondary | ICD-10-CM | POA: Diagnosis not present

## 2019-07-16 DIAGNOSIS — J3089 Other allergic rhinitis: Secondary | ICD-10-CM | POA: Diagnosis not present

## 2019-07-24 DIAGNOSIS — J3089 Other allergic rhinitis: Secondary | ICD-10-CM | POA: Diagnosis not present

## 2019-07-28 DIAGNOSIS — J3089 Other allergic rhinitis: Secondary | ICD-10-CM | POA: Diagnosis not present

## 2019-08-04 DIAGNOSIS — J4530 Mild persistent asthma, uncomplicated: Secondary | ICD-10-CM | POA: Diagnosis not present

## 2019-08-04 DIAGNOSIS — J3089 Other allergic rhinitis: Secondary | ICD-10-CM | POA: Diagnosis not present

## 2019-08-04 DIAGNOSIS — H1045 Other chronic allergic conjunctivitis: Secondary | ICD-10-CM | POA: Diagnosis not present

## 2019-08-20 DIAGNOSIS — J3089 Other allergic rhinitis: Secondary | ICD-10-CM | POA: Diagnosis not present

## 2019-08-26 ENCOUNTER — Encounter: Payer: Self-pay | Admitting: Internal Medicine

## 2019-08-27 DIAGNOSIS — J3089 Other allergic rhinitis: Secondary | ICD-10-CM | POA: Diagnosis not present

## 2019-08-27 NOTE — Telephone Encounter (Signed)
See my chart message.  Pt can schedule appt to discuss.  I can see her or do virtual visit - Thursday 12:00 09/03/19 (Thursday).

## 2019-08-28 NOTE — Telephone Encounter (Signed)
85M was taken so scheduled patient  12:30 virtual visit.

## 2019-08-28 NOTE — Telephone Encounter (Signed)
If possible, please change her appt to Wednesday 09/02/19 at 12:00 or Friday 09/05/19 at 8:30 instead of 12:30 on Thursday.  Thanks

## 2019-08-31 NOTE — Telephone Encounter (Signed)
Appt moved to 5/5 @ 12. Pt aware.

## 2019-09-02 ENCOUNTER — Telehealth (INDEPENDENT_AMBULATORY_CARE_PROVIDER_SITE_OTHER): Payer: BC Managed Care – PPO | Admitting: Internal Medicine

## 2019-09-02 DIAGNOSIS — H5789 Other specified disorders of eye and adnexa: Secondary | ICD-10-CM

## 2019-09-02 DIAGNOSIS — J4520 Mild intermittent asthma, uncomplicated: Secondary | ICD-10-CM

## 2019-09-02 NOTE — Progress Notes (Signed)
Patient ID: Donna Hensley, female   DOB: [birth year], 54 y.o.   MRN: [id number]   Virtual Visit via video Note  This visit type was conducted due to national recommendations for restrictions regarding the COVID-19 pandemic (e.g. social distancing).  This format is felt to be most appropriate for this patient at this time.  All issues noted in this document were discussed and addressed.  No physical exam was performed (except for noted visual exam findings with Video Visits).   I connected with Donna Hensley by a video enabled telemedicine application and verified that I am speaking with the correct person using two identifiers. Location patient: home Location provider: work  Persons participating in the virtual visit: patient, provider  The limitations, risks, security and privacy concerns of performing an evaluation and management service by video and the availability of in person appointments have been discussed.   The patient expressed understanding and agreed to proceed.   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  She has been working from home.  This has been going well.  Just has to go in - intermittently.  Her work is planning to transition some people back in the office.  She is in a large cubicle with three other people.  She has a history of asthma/reactive airways disease.  When wears a mask for extended period of time - develops dry mouth, cough and is harder for her to breathe.  Also causes tenderness under her eyes and if she wears for more than one hour - contributes to headache.  She has been wearing her mask when she goes out, but has limited the amount of time out.  Trying to stay in due to covid restrictions.  She is able to continue to work from home.  Sees an allergist.  Just evaluated 08/04/19 - clearing her throat.  Taking zyrtec bid and astelin.  Symptoms stable.  No increased sob.     ROS: See  pertinent positives and negatives per HPI.  Past Medical History:  Diagnosis Date  . Allergy   . Asthma   . GERD (gastroesophageal reflux disease)   . Gestational diabetes   . Hypertension     Past Surgical History:  Procedure Laterality Date  . APPENDECTOMY  1986  . [place]  . CHOLECYSTECTOMY  2013  . COLONOSCOPY WITH PROPOFOL N/A 05/05/2018   Procedure: COLONOSCOPY WITH PROPOFOL;  Surgeon: [name], MD;  Location: [facility] ENDOSCOPY;  Service: Endoscopy;  Laterality: N/A;  . ESOPHAGOGASTRODUODENOSCOPY (EGD) WITH PROPOFOL N/A 05/05/2018   Procedure: ESOPHAGOGASTRODUODENOSCOPY (EGD) WITH PROPOFOL;  Surgeon: [name], MD;  Location: [facility] ENDOSCOPY;  Service: Endoscopy;  Laterality: N/A;  . TONSILLECTOMY  1987  . wisdom tooth removal      Family History  Problem Relation Age of Onset  . Hyperlipidemia Mother   . Osteoporosis Mother   . Hypertension Father   . Arthritis Father   . Sleep apnea Father   . Heart disease Father   . Sleep apnea Brother   . Arthritis Paternal Grandmother   . Diabetes Paternal Grandmother   . Breast cancer Neg Hx     SOCIAL HX: reviewed.    Current Outpatient Medications:  .  albuterol (PROVENTIL HFA;VENTOLIN HFA) 108 (90 Base) MCG/ACT inhaler, Inhale 2 puffs into the lungs every 6 (six) hours as needed for wheezing., Disp: 6.7 g, Rfl: 2 .  azelastine (OPTIVAR) 0.05 % ophthalmic solution, Place 1 drop into both eyes daily., Disp: , Rfl:  .  BREO ELLIPTA 200-25 MCG/INH AEPB, Inhale 1 puff into the lungs daily. , Disp: , Rfl:  .  Calcium-Magnesium-Vitamin D (CALCIUM 500 PO), Take 1 tablet by mouth daily., Disp: , Rfl:  .  Cholecalciferol (VITAMIN D3) 1000 units CAPS, Take 1 capsule by mouth daily., Disp: , Rfl:  .  clotrimazole (MYCELEX) 10 MG troche, One troche tid prn, Disp: 35 Troche, Rfl: 0 .  Cyanocobalamin (VITAMIN B 12 PO), Take 1 tablet by mouth daily., Disp: , Rfl:  .  fluconazole (DIFLUCAN) 150 MG tablet,  Take one tablet x 1 dose.  If no improvement, then may repeat x 1 in 3 days., Disp: 2 tablet, Rfl: 0 .  fluticasone (FLONASE) 50 MCG/ACT nasal spray, USE 2 SPRAYS IN EACH NOSTRIL DAILY, Disp: 48 g, Rfl: 3 .  glucose blood test strip, Use as instructed, Disp: 200 each, Rfl: 3 .  levocetirizine (XYZAL) 5 MG tablet, Take 5 mg by mouth every evening., Disp: , Rfl: 3 .  losartan (COZAAR) 50 MG tablet, TAKE 1 TABLET DAILY, Disp: 90 tablet, Rfl: 3 .  metFORMIN (GLUCOPHAGE) 1000 MG tablet, Take 1 tablet (1,000 mg total) by mouth 2 (two) times daily with a meal., Disp: 180 tablet, Rfl: 1 .  montelukast (SINGULAIR) 10 MG tablet, TAKE 1 TABLET DAILY AT BEDTIME, Disp: 90 tablet, Rfl: 3 .  Multiple Vitamin (MULTIVITAMIN) tablet, Take 1 tablet by mouth daily., Disp: , Rfl:  .  pantoprazole (PROTONIX) 40 MG tablet, TAKE 1 TABLET DAILY, Disp: 90 tablet, Rfl: 3 .  predniSONE (STERAPRED UNI-PAK 21 TAB) 10 MG (21) TBPK tablet, 6 tablets on day 1; decrease by 1 tablet daily until gone., Disp: 21 tablet, Rfl: 0 .  sulfacetamide (BLEPH-10) 10 % ophthalmic solution, Place 1 drop into both eyes 4 (four) times daily., Disp: 15 mL, Rfl: 0 .  VITAMIN E PO, Take by mouth daily., Disp: , Rfl:   EXAM:  GENERAL: alert, oriented, appears well and in no acute distress  HEENT: atraumatic, conjunttiva clear, no obvious abnormalities on inspection of external nose and ears  NECK: normal movements of the head and neck  LUNGS: on inspection no signs of respiratory distress, breathing rate appears normal, no obvious gross SOB, gasping or wheezing  CV: no obvious cyanosis  PSYCH/NEURO: pleasant and cooperative, no obvious depression or anxiety, speech and thought processing grossly intact  ASSESSMENT AND PLAN:  Discussed the following assessment and plan:  Mild intermittent asthma in adult without complication Has known reactive airways/asthma.  Issues with wearing mask as outlined.  Occurs if she wears for extended period  of time.  She has been wearing her mask, but has limited her time out.  Trying to stay in due to covid restrictions.  Able to continue to work from home.  Needs letter for work.  Continue zyrtec bid and astelin.    Eye irritation Resolved.  No longer taking her comforter to dry cleaners.      I discussed the assessment and treatment plan with the patient. The patient was provided an opportunity to ask questions and all were answered. The patient agreed with the plan and demonstrated an understanding of the instructions.   The patient was advised to call back or seek an in-person evaluation if the symptoms worsen or if the condition fails to improve as anticipated.    Einar Pheasant, MD

## 2019-09-03 ENCOUNTER — Telehealth: Payer: BC Managed Care – PPO | Admitting: Internal Medicine

## 2019-09-04 ENCOUNTER — Telehealth: Payer: BC Managed Care – PPO | Admitting: Internal Medicine

## 2019-09-04 DIAGNOSIS — J3089 Other allergic rhinitis: Secondary | ICD-10-CM | POA: Diagnosis not present

## 2019-09-07 ENCOUNTER — Encounter: Payer: Self-pay | Admitting: Internal Medicine

## 2019-09-07 NOTE — Assessment & Plan Note (Signed)
Resolved.  No longer taking her comforter to dry cleaners.

## 2019-09-07 NOTE — Assessment & Plan Note (Signed)
Has known reactive airways/asthma.  Issues with wearing mask as outlined.  Occurs if she wears for extended period of time.  She has been wearing her mask, but has limited her time out.  Trying to stay in due to covid restrictions.  Able to continue to work from home.  Needs letter for work.  Continue zyrtec bid and astelin.

## 2019-09-08 DIAGNOSIS — J3089 Other allergic rhinitis: Secondary | ICD-10-CM | POA: Diagnosis not present

## 2019-09-24 ENCOUNTER — Other Ambulatory Visit: Payer: Self-pay | Admitting: Internal Medicine

## 2019-09-24 DIAGNOSIS — J3089 Other allergic rhinitis: Secondary | ICD-10-CM | POA: Diagnosis not present

## 2019-09-29 DIAGNOSIS — J3089 Other allergic rhinitis: Secondary | ICD-10-CM | POA: Diagnosis not present

## 2019-10-01 ENCOUNTER — Other Ambulatory Visit: Payer: Self-pay | Admitting: Internal Medicine

## 2019-10-13 DIAGNOSIS — J3089 Other allergic rhinitis: Secondary | ICD-10-CM | POA: Diagnosis not present

## 2019-10-19 ENCOUNTER — Other Ambulatory Visit (INDEPENDENT_AMBULATORY_CARE_PROVIDER_SITE_OTHER): Payer: BC Managed Care – PPO

## 2019-10-19 ENCOUNTER — Other Ambulatory Visit: Payer: Self-pay

## 2019-10-19 DIAGNOSIS — E1165 Type 2 diabetes mellitus with hyperglycemia: Secondary | ICD-10-CM

## 2019-10-19 LAB — HEPATIC FUNCTION PANEL
ALT: 35 U/L (ref 0–35)
AST: 26 U/L (ref 0–37)
Albumin: 4.3 g/dL (ref 3.5–5.2)
Alkaline Phosphatase: 86 U/L (ref 39–117)
Bilirubin, Direct: 0.3 mg/dL (ref 0.0–0.3)
Total Bilirubin: 0.8 mg/dL (ref 0.2–1.2)
Total Protein: 7.4 g/dL (ref 6.0–8.3)

## 2019-10-19 LAB — BASIC METABOLIC PANEL
BUN: 12 mg/dL (ref 6–23)
CO2: 26 mEq/L (ref 19–32)
Calcium: 9.6 mg/dL (ref 8.4–10.5)
Chloride: 102 mEq/L (ref 96–112)
Creatinine, Ser: 0.93 mg/dL (ref 0.40–1.20)
GFR: 63.42 mL/min (ref >60.00)
Glucose, Bld: 133 mg/dL — ABNORMAL HIGH (ref 70–99)
Potassium: 3.8 mEq/L (ref 3.5–5.1)
Sodium: 138 mEq/L (ref 135–145)

## 2019-10-19 LAB — LIPID PANEL
Cholesterol: 153 mg/dL (ref 0–200)
HDL: 54 mg/dL (ref >39.00)
LDL Cholesterol: 87 mg/dL (ref 0–99)
NonHDL: 98.77
Total CHOL/HDL Ratio: 3
Triglycerides: 57 mg/dL (ref 0.0–149.0)
VLDL: 11.4 mg/dL (ref 0.0–40.0)

## 2019-10-19 LAB — HEMOGLOBIN A1C: Hgb A1c MFr Bld: 6.5 % (ref 4.6–6.5)

## 2019-10-22 ENCOUNTER — Other Ambulatory Visit: Payer: Self-pay

## 2019-10-22 ENCOUNTER — Ambulatory Visit: Payer: BC Managed Care – PPO | Admitting: Internal Medicine

## 2019-10-22 DIAGNOSIS — E611 Iron deficiency: Secondary | ICD-10-CM

## 2019-10-22 DIAGNOSIS — K219 Gastro-esophageal reflux disease without esophagitis: Secondary | ICD-10-CM | POA: Diagnosis not present

## 2019-10-22 DIAGNOSIS — H5789 Other specified disorders of eye and adnexa: Secondary | ICD-10-CM | POA: Diagnosis not present

## 2019-10-22 DIAGNOSIS — J4520 Mild intermittent asthma, uncomplicated: Secondary | ICD-10-CM | POA: Diagnosis not present

## 2019-10-22 DIAGNOSIS — I10 Essential (primary) hypertension: Secondary | ICD-10-CM

## 2019-10-22 DIAGNOSIS — E1165 Type 2 diabetes mellitus with hyperglycemia: Secondary | ICD-10-CM

## 2019-10-22 MED ORDER — EUCRISA 2 % EX OINT: TOPICAL_OINTMENT | CUTANEOUS | 0 refills | Status: DC

## 2019-10-22 NOTE — Progress Notes (Signed)
Patient ID: Donna Hensley, female   DOB: November 15, 1965, 54 y.o.   MRN: [id number]   Subjective:    Patient ID: Donna Hensley, female    DOB: 07-05-1965, 54 y.o.   MRN: [id number]  HPI This visit occurred during the SARS-CoV-2 public health emergency.  Safety protocols were in place, including screening questions prior to the visit, additional usage of staff PPE, and extensive cleaning of exam room while observing appropriate contact time as indicated for disinfecting solutions.  Patient here for a scheduled follow up.  She is doing well.  Daughter is home from [place] (for a month).  Handling stress.  Doing better now.  Breathing stable.  Working from home.  No chest pain or sob reported.  No increased cough or congestion.  No acid reflux or abdominal pain reported.  Sugar is much better.  Discussed recent labs.  a1c 6.5.  Discussed diet and exercise.  Previous eye irritation resolved.     Past Medical History:  Diagnosis Date  . Allergy   . Asthma   . GERD (gastroesophageal reflux disease)   . Gestational diabetes   . Hypertension    Past Surgical History:  Procedure Laterality Date  . APPENDECTOMY  1986  . [place]  . CHOLECYSTECTOMY  2013  . COLONOSCOPY WITH PROPOFOL N/A 05/05/2018   Procedure: COLONOSCOPY WITH PROPOFOL;  Surgeon: [name], MD;  Location: [facility] ENDOSCOPY;  Service: Endoscopy;  Laterality: N/A;  . ESOPHAGOGASTRODUODENOSCOPY (EGD) WITH PROPOFOL N/A 05/05/2018   Procedure: ESOPHAGOGASTRODUODENOSCOPY (EGD) WITH PROPOFOL;  Surgeon: [name], MD;  Location: [facility] ENDOSCOPY;  Service: Endoscopy;  Laterality: N/A;  . TONSILLECTOMY  1987  . wisdom tooth removal     Family History  Problem Relation Age of Onset  . Hyperlipidemia Mother   . Osteoporosis Mother   . Hypertension Father   . Arthritis Father   . Sleep apnea Father   . Heart disease Father   . Sleep apnea Brother   . Arthritis Paternal Grandmother   . Diabetes  Paternal Grandmother   . Breast cancer Neg Hx    Social History   Socioeconomic History  . Marital status: Married    Spouse name: Not on file  . Number of children: 2  . Years of education: Not on file  . Highest education level: Not on file  Occupational History  . 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 Smoking status: Former Smoker    Packs/day: 0.25    Years: 5.00    Pack years: 1.25    Types: Cigarettes    Quit date: 09/28/1988    Years since quitting: 31.1  . Smokeless tobacco: Never Used  . Tobacco comment: smoked briefly as a teenager  Vaping Use  . Vaping Use: Never used  Substance and Sexual Activity  . Alcohol use: Yes    Alcohol/week: 1.0 standard drink    Types: 1 Glasses of wine per week    Comment: Occasional 1-2 per month  . Drug use: Never  . Sexual activity: Not on file  Other Topics Concern  . Not on file  Social History Narrative   Regular exercise-yes, walk   Diet: fast food, trying to improve diet         Social Determinants of Health   Financial Resource Strain:   . Difficulty of Paying Living Expenses:   Food Insecurity:   . Worried About Charity fundraiser  in the Last Year:   . [place] in the Last Year:   Transportation Needs:   . Film/video editor (Medical):   [name] Lack of Transportation (Non-Medical):   Physical Activity:   . Days of Exercise per Week:   . Minutes of Exercise per Session:   Stress:   . Feeling of Stress :   Social Connections:   . Frequency of Communication with Friends and Family:   . Frequency of Social Gatherings with Friends and Family:   . Attends Religious Services:   . Active Member of Clubs or Organizations:   . Attends Archivist Meetings:   [name] Marital Status:     Outpatient Encounter Medications as of 10/22/2019  Medication Sig  . albuterol (PROVENTIL HFA;VENTOLIN HFA) 108 (90 Base) MCG/ACT inhaler Inhale 2 puffs into the lungs every 6 (six) hours as  needed for wheezing.  [name] azelastine (OPTIVAR) 0.05 % ophthalmic solution Place 1 drop into both eyes daily.  [name] BREO ELLIPTA 200-25 MCG/INH AEPB Inhale 1 puff into the lungs daily.   . Calcium-Magnesium-Vitamin D (CALCIUM 500 PO) Take 1 tablet by mouth daily.  . Cholecalciferol (VITAMIN D3) 1000 units CAPS Take 1 capsule by mouth daily.  Stasia Cavalier (EUCRISA) 2 % OINT Use as directed.  . Cyanocobalamin (VITAMIN B 12 PO) Take 1 tablet by mouth daily.  . fluconazole (DIFLUCAN) 150 MG tablet Take one tablet x 1 dose.  If no improvement, then may repeat x 1 in 3 days.  . fluticasone (FLONASE) 50 MCG/ACT nasal spray USE 2 SPRAYS IN EACH NOSTRIL DAILY  . glucose blood test strip Use as instructed  . levocetirizine (XYZAL) 5 MG tablet Take 5 mg by mouth every evening.  [name] losartan (COZAAR) 50 MG tablet TAKE 1 TABLET DAILY  . metFORMIN (GLUCOPHAGE) 1000 MG tablet Take 1 tablet (1,000 mg total) by mouth 2 (two) times daily with a meal.  . montelukast (SINGULAIR) 10 MG tablet TAKE 1 TABLET DAILY AT BEDTIME  . Multiple Vitamin (MULTIVITAMIN) tablet Take 1 tablet by mouth daily.  . pantoprazole (PROTONIX) 40 MG tablet TAKE 1 TABLET DAILY  . VITAMIN E PO Take by mouth daily.  . [DISCONTINUED] clotrimazole (MYCELEX) 10 MG troche One troche tid prn  . [DISCONTINUED] predniSONE (STERAPRED UNI-PAK 21 TAB) 10 MG (21) TBPK tablet 6 tablets on day 1; decrease by 1 tablet daily until gone.  . [DISCONTINUED] sulfacetamide (BLEPH-10) 10 % ophthalmic solution Place 1 drop into both eyes 4 (four) times daily.   No facility-administered encounter medications on file as of 10/22/2019.    Review of Systems  Constitutional: Negative for appetite change and unexpected weight change.  HENT: Negative for congestion and sinus pressure.   Respiratory: Negative for cough, chest tightness and shortness of breath.   Cardiovascular: Negative for chest pain, palpitations and leg swelling.  Gastrointestinal: Negative for  abdominal pain, diarrhea, nausea and vomiting.  Genitourinary: Negative for difficulty urinating and dysuria.  Musculoskeletal: Negative for joint swelling and myalgias.  Skin: Negative for color change and rash.  Neurological: Negative for dizziness, light-headedness and headaches.  Psychiatric/Behavioral: Negative for agitation and dysphoric mood.       Objective:    Physical Exam Vitals reviewed.  Constitutional:      General: She is not in acute distress.    Appearance: Normal appearance.  HENT:     Head: Normocephalic and atraumatic.     Right Ear: External ear normal.     Left Ear:  External ear normal.  Eyes:     General: No scleral icterus.       Right eye: No discharge.        Left eye: No discharge.     Conjunctiva/sclera: Conjunctivae normal.  Neck:     Thyroid: No thyromegaly.  Cardiovascular:     Rate and Rhythm: Normal rate and regular rhythm.  Pulmonary:     Effort: No respiratory distress.     Breath sounds: Normal breath sounds. No wheezing.  Abdominal:     General: Bowel sounds are normal.     Palpations: Abdomen is soft.     Tenderness: There is no abdominal tenderness.  Musculoskeletal:        General: No swelling or tenderness.     Cervical back: Neck supple. No tenderness.  Lymphadenopathy:     Cervical: No cervical adenopathy.  Skin:    Findings: No erythema or rash.  Neurological:     Mental Status: She is alert.  Psychiatric:        Mood and Affect: Mood normal.        Behavior: Behavior normal.     BP 140/88   Pulse 76   Temp (!) 96.3 F (35.7 C)   Resp 16   Ht _0  (1.575 m)   Wt 209 lb (94.8 kg)   LMP 07/17/2017   SpO2 98%   BMI 38.23 kg/m  Wt Readings from Last 3 Encounters:  10/22/19 209 lb (94.8 kg)  06/09/19 209 lb (94.8 kg)  04/04/19 210 lb (95.3 kg)     Lab Results  Component Value Date   WBC 8.7 06/05/2019   HGB 14.7 06/05/2019   HCT 44.6 06/05/2019   PLT 312.0 06/05/2019   GLUCOSE 133 (H) 10/19/2019   CHOL  153 10/19/2019   TRIG 57.0 10/19/2019   HDL 54.00 10/19/2019   LDLCALC 87 10/19/2019   ALT 35 10/19/2019   AST 26 10/19/2019   NA 138 10/19/2019   K 3.8 10/19/2019   CL 102 10/19/2019   CREATININE 0.93 10/19/2019   BUN 12 10/19/2019   CO2 26 10/19/2019   TSH 1.10 02/02/2019   HGBA1C 6.5 10/19/2019   MICROALBUR 1.3 02/02/2019    MM DIAG BREAST TOMO UNI LEFT  Result Date: 07/03/2019 CLINICAL DATA:  Patient was called back from screening mammogram for a possible asymmetry in the left breast. EXAM: DIGITAL DIAGNOSTIC UNILATERAL LEFT MAMMOGRAM WITH CAD AND TOMO COMPARISON:  Previous exam(s). ACR Breast Density Category b: There are scattered areas of fibroglandular density. FINDINGS: Additional imaging of the left breast was performed. No suspicious mass, malignant type microcalcifications or distortion detected. Mammographic images were processed with CAD. IMPRESSION: No evidence of malignancy in the left breast. RECOMMENDATION: Bilateral screening mammogram in 1 year is recommended. I have discussed the findings and recommendations with the patient. If applicable, a reminder letter will be sent to the patient regarding the next appointment. BI-RADS CATEGORY  1: Negative. Electronically Signed   By: Lillia Mountain M.D.   On: 07/03/2019 11:30       Assessment & Plan:   Problem List Items Addressed This Visit    Diabetes ([place])    Low carb diet and exercise.  Sugars improved.  a1c 6.5.  Continue metformin.  Follow met b and a1c.       Relevant Orders   Hemoglobin A1c   Hepatic function panel   Lipid panel   TSH   Basic metabolic panel   Microalbumin /  creatinine urine ratio   Essential hypertension, benign    On recheck improved.  Have her spot check her pressure.  Continue losartan.  Follow pressures.  Follow metabolic panel.       Eye irritation    Resolved.       GERD    No acid reflux reported.  Protonix.       Iron deficiency    Follow cbc and iron studies.        Mild  intermittent asthma in adult without complication    Breathing stable.  No increased cough or congestion.            Einar Pheasant, MD

## 2019-10-23 DIAGNOSIS — J3089 Other allergic rhinitis: Secondary | ICD-10-CM | POA: Diagnosis not present

## 2019-10-30 DIAGNOSIS — J3089 Other allergic rhinitis: Secondary | ICD-10-CM | POA: Diagnosis not present

## 2019-11-01 ENCOUNTER — Encounter: Payer: Self-pay | Admitting: Internal Medicine

## 2019-11-01 NOTE — Assessment & Plan Note (Signed)
Follow cbc and iron studies.  

## 2019-11-01 NOTE — Assessment & Plan Note (Signed)
Breathing stable.  No increased cough or congestion.

## 2019-11-01 NOTE — Assessment & Plan Note (Signed)
No acid reflux reported.  Protonix.

## 2019-11-01 NOTE — Assessment & Plan Note (Signed)
Resolved

## 2019-11-01 NOTE — Assessment & Plan Note (Signed)
On recheck improved.  Have her spot check her pressure.  Continue losartan.  Follow pressures.  Follow metabolic panel.

## 2019-11-01 NOTE — Assessment & Plan Note (Signed)
Low carb diet and exercise.  Sugars improved.  a1c 6.5.  Continue metformin.  Follow met b and a1c.

## 2019-11-02 ENCOUNTER — Encounter: Payer: Self-pay | Admitting: Internal Medicine

## 2019-11-04 NOTE — Telephone Encounter (Signed)
ok 

## 2019-11-04 NOTE — Telephone Encounter (Signed)
Pt scheduled with Dr Scott 

## 2019-11-04 NOTE — Telephone Encounter (Signed)
See attached message for information regarding pts daughter

## 2019-11-06 DIAGNOSIS — J3089 Other allergic rhinitis: Secondary | ICD-10-CM | POA: Diagnosis not present

## 2019-11-12 DIAGNOSIS — J3089 Other allergic rhinitis: Secondary | ICD-10-CM | POA: Diagnosis not present

## 2019-11-18 ENCOUNTER — Other Ambulatory Visit: Payer: Self-pay | Admitting: Internal Medicine

## 2019-11-20 DIAGNOSIS — J3089 Other allergic rhinitis: Secondary | ICD-10-CM | POA: Diagnosis not present

## 2019-11-26 DIAGNOSIS — J3089 Other allergic rhinitis: Secondary | ICD-10-CM | POA: Diagnosis not present

## 2019-12-04 DIAGNOSIS — J301 Allergic rhinitis due to pollen: Secondary | ICD-10-CM | POA: Diagnosis not present

## 2019-12-04 DIAGNOSIS — J3081 Allergic rhinitis due to animal (cat) (dog) hair and dander: Secondary | ICD-10-CM | POA: Diagnosis not present

## 2019-12-15 DIAGNOSIS — J3089 Other allergic rhinitis: Secondary | ICD-10-CM | POA: Diagnosis not present

## 2019-12-17 DIAGNOSIS — J3089 Other allergic rhinitis: Secondary | ICD-10-CM | POA: Diagnosis not present

## 2019-12-22 DIAGNOSIS — J3089 Other allergic rhinitis: Secondary | ICD-10-CM | POA: Diagnosis not present

## 2019-12-29 DIAGNOSIS — J3089 Other allergic rhinitis: Secondary | ICD-10-CM | POA: Diagnosis not present

## 2020-01-01 ENCOUNTER — Encounter: Payer: Self-pay | Admitting: Internal Medicine

## 2020-01-05 DIAGNOSIS — J3089 Other allergic rhinitis: Secondary | ICD-10-CM | POA: Diagnosis not present

## 2020-01-12 DIAGNOSIS — J3089 Other allergic rhinitis: Secondary | ICD-10-CM | POA: Diagnosis not present

## 2020-01-19 DIAGNOSIS — J3089 Other allergic rhinitis: Secondary | ICD-10-CM | POA: Diagnosis not present

## 2020-02-02 DIAGNOSIS — J3089 Other allergic rhinitis: Secondary | ICD-10-CM | POA: Diagnosis not present

## 2020-02-09 DIAGNOSIS — J3089 Other allergic rhinitis: Secondary | ICD-10-CM | POA: Diagnosis not present

## 2020-02-16 DIAGNOSIS — J3089 Other allergic rhinitis: Secondary | ICD-10-CM | POA: Diagnosis not present

## 2020-02-23 ENCOUNTER — Other Ambulatory Visit: Payer: Self-pay

## 2020-02-23 ENCOUNTER — Other Ambulatory Visit (INDEPENDENT_AMBULATORY_CARE_PROVIDER_SITE_OTHER): Payer: BC Managed Care – PPO

## 2020-02-23 DIAGNOSIS — E1165 Type 2 diabetes mellitus with hyperglycemia: Secondary | ICD-10-CM

## 2020-02-23 LAB — LIPID PANEL
Cholesterol: 155 mg/dL (ref 0–200)
HDL: 55.2 mg/dL (ref >39.00)
LDL Cholesterol: 85 mg/dL (ref 0–99)
NonHDL: 99.97
Total CHOL/HDL Ratio: 3
Triglycerides: 74 mg/dL (ref 0.0–149.0)
VLDL: 14.8 mg/dL (ref 0.0–40.0)

## 2020-02-23 LAB — BASIC METABOLIC PANEL
BUN: 11 mg/dL (ref 6–23)
CO2: 25 mEq/L (ref 19–32)
Calcium: 9.8 mg/dL (ref 8.4–10.5)
Chloride: 102 mEq/L (ref 96–112)
Creatinine, Ser: 0.96 mg/dL (ref 0.40–1.20)
GFR: 68.33 mL/min (ref >60.00)
Glucose, Bld: 131 mg/dL — ABNORMAL HIGH (ref 70–99)
Potassium: 4.1 mEq/L (ref 3.5–5.1)
Sodium: 137 mEq/L (ref 135–145)

## 2020-02-23 LAB — HEPATIC FUNCTION PANEL
ALT: 32 U/L (ref 0–35)
AST: 26 U/L (ref 0–37)
Albumin: 4.3 g/dL (ref 3.5–5.2)
Alkaline Phosphatase: 90 U/L (ref 39–117)
Bilirubin, Direct: 0.3 mg/dL (ref 0.0–0.3)
Total Bilirubin: 1 mg/dL (ref 0.2–1.2)
Total Protein: 6.9 g/dL (ref 6.0–8.3)

## 2020-02-23 LAB — HEMOGLOBIN A1C: Hgb A1c MFr Bld: 6.7 % — ABNORMAL HIGH (ref 4.6–6.5)

## 2020-02-23 LAB — MICROALBUMIN / CREATININE URINE RATIO
Creatinine,U: 83.4 mg/dL
Microalb Creat Ratio: 2.4 mg/g (ref 0.0–30.0)
Microalb, Ur: 2 mg/dL — ABNORMAL HIGH (ref 0.0–1.9)

## 2020-02-23 LAB — TSH: TSH: 1.07 u[IU]/mL (ref 0.35–4.50)

## 2020-02-25 ENCOUNTER — Encounter: Payer: Self-pay | Admitting: Internal Medicine

## 2020-02-25 ENCOUNTER — Other Ambulatory Visit: Payer: Self-pay

## 2020-02-25 ENCOUNTER — Ambulatory Visit (INDEPENDENT_AMBULATORY_CARE_PROVIDER_SITE_OTHER): Payer: BC Managed Care – PPO | Admitting: Internal Medicine

## 2020-02-25 VITALS — BP 124/80 | HR 85 | Temp 98.1°F | Resp 16 | Ht 62.0 in | Wt 209.0 lb

## 2020-02-25 DIAGNOSIS — E611 Iron deficiency: Secondary | ICD-10-CM | POA: Diagnosis not present

## 2020-02-25 DIAGNOSIS — J4520 Mild intermittent asthma, uncomplicated: Secondary | ICD-10-CM

## 2020-02-25 DIAGNOSIS — K219 Gastro-esophageal reflux disease without esophagitis: Secondary | ICD-10-CM

## 2020-02-25 DIAGNOSIS — I10 Essential (primary) hypertension: Secondary | ICD-10-CM

## 2020-02-25 DIAGNOSIS — E1165 Type 2 diabetes mellitus with hyperglycemia: Secondary | ICD-10-CM | POA: Diagnosis not present

## 2020-02-25 DIAGNOSIS — J3089 Other allergic rhinitis: Secondary | ICD-10-CM | POA: Diagnosis not present

## 2020-02-25 DIAGNOSIS — L0292 Furuncle, unspecified: Secondary | ICD-10-CM

## 2020-02-25 DIAGNOSIS — Z0000 Encounter for general adult medical examination without abnormal findings: Secondary | ICD-10-CM

## 2020-02-25 MED ORDER — BACTROBAN NASAL 2 % NA OINT: TOPICAL_OINTMENT | NASAL | 0 refills | Status: DC

## 2020-02-25 MED ORDER — ROSUVASTATIN CALCIUM 5 MG PO TABS: ORAL_TABLET | ORAL | 1 refills | Status: DC

## 2020-02-25 NOTE — Assessment & Plan Note (Signed)
Physical today 02/25/20.  PAP 02/02/19 negative with negative HPV.  Colonoscopy 04/2018 - recommended f/u in 3 years.

## 2020-02-25 NOTE — Progress Notes (Signed)
Patient ID: Donna Hensley, female   DOB: 10-13-1965, 54 y.o.   MRN: [id number]   Subjective:    Patient ID: Donna Hensley, female    DOB: 19-Jan-1966, 54 y.o.   MRN: [id number]  HPI This visit occurred during the SARS-CoV-2 public health emergency.  Safety protocols were in place, including screening questions prior to the visit, additional usage of staff PPE, and extensive cleaning of exam room while observing appropriate contact time as indicated for disinfecting solutions.  Patient here for her physical exam.  She reports she is doing relatively well.  Breathing doing well. No increased cough or congestion.  Is exercising with trainer 2x/week.  No chest pain or sob reported.  No acid reflux or abdominal pain reported.  Discussed labs.  Discussed given history of diabetes, recommended for her to be on a statin medication.  She is agreeable.  Does have a "cyst" on her back.  Has had one drained previously.  Some increased tenderness.  No fever.     Past Medical History:  Diagnosis Date  . Allergy   . Asthma   . GERD (gastroesophageal reflux disease)   . Gestational diabetes   . Hypertension    Past Surgical History:  Procedure Laterality Date  . APPENDECTOMY  1986  . [place]  . CHOLECYSTECTOMY  2013  . COLONOSCOPY WITH PROPOFOL N/A 05/05/2018   Procedure: COLONOSCOPY WITH PROPOFOL;  Surgeon: [name], MD;  Location: [facility] ENDOSCOPY;  Service: Endoscopy;  Laterality: N/A;  . ESOPHAGOGASTRODUODENOSCOPY (EGD) WITH PROPOFOL N/A 05/05/2018   Procedure: ESOPHAGOGASTRODUODENOSCOPY (EGD) WITH PROPOFOL;  Surgeon: [name], MD;  Location: [facility] ENDOSCOPY;  Service: Endoscopy;  Laterality: N/A;  . TONSILLECTOMY  1987  . wisdom tooth removal     Family History  Problem Relation Age of Onset  . Hyperlipidemia Mother   . Osteoporosis Mother   . Hypertension Father   . Arthritis Father   . Sleep apnea Father   . Heart disease Father   . Sleep apnea  Brother   . Arthritis Paternal Grandmother   . Diabetes Paternal Grandmother   . Breast cancer Neg Hx    Social History   Socioeconomic History  . Marital status: Married    Spouse name: Not on file  . Number of children: 2  . Years of education: Not on file  . Highest education level: Not on file  Occupational History  . 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 Smoking status: Former Smoker    Packs/day: 0.25    Years: 5.00    Pack years: 1.25    Types: Cigarettes    Quit date: 09/28/1988    Years since quitting: 31.4  . Smokeless tobacco: Never Used  . Tobacco comment: smoked briefly as a teenager  Vaping Use  . Vaping Use: Never used  Substance and Sexual Activity  . Alcohol use: Yes    Alcohol/week: 1.0 standard drink    Types: 1 Glasses of wine per week    Comment: Occasional 1-2 per month  . Drug use: Never  . Sexual activity: Not on file  Other Topics Concern  . Not on file  Social History Narrative   Regular exercise-yes, walk   Diet: fast food, trying to improve diet         Social Determinants of Health   Financial Resource Strain:   . Difficulty of Paying Living Expenses: Not on file  Food Insecurity:   . Worried About Charity fundraiser in the Last Year: Not on file  . Ran Out of Food in the Last Year: Not on file  Transportation Needs:   . Lack of Transportation (Medical): Not on file  . Lack of Transportation (Non-Medical): Not on file  Physical Activity:   . Days of Exercise per Week: Not on file  . Minutes of Exercise per Session: Not on file  Stress:   . Feeling of Stress : Not on file  Social Connections:   . Frequency of Communication with Friends and Family: Not on file  . Frequency of Social Gatherings with Friends and Family: Not on file  . Attends Religious Services: Not on file  . Active Member of Clubs or Organizations: Not on file  . Attends Archivist Meetings: Not on file  . Marital  Status: Not on file    Outpatient Encounter Medications as of 02/25/2020  Medication Sig  . albuterol (PROVENTIL HFA;VENTOLIN HFA) 108 (90 Base) MCG/ACT inhaler Inhale 2 puffs into the lungs every 6 (six) hours as needed for wheezing.  [name] azelastine (OPTIVAR) 0.05 % ophthalmic solution Place 1 drop into both eyes daily.  [name] BREO ELLIPTA 200-25 MCG/INH AEPB Inhale 1 puff into the lungs daily.   . Calcium-Magnesium-Vitamin D (CALCIUM 500 PO) Take 1 tablet by mouth daily.  . Cholecalciferol (VITAMIN D3) 1000 units CAPS Take 1 capsule by mouth daily.  Stasia Cavalier (EUCRISA) 2 % OINT Use as directed.  . Cyanocobalamin (VITAMIN B 12 PO) Take 1 tablet by mouth daily.  . fluconazole (DIFLUCAN) 150 MG tablet Take one tablet x 1 dose.  If no improvement, then may repeat x 1 in 3 days.  . fluticasone (FLONASE) 50 MCG/ACT nasal spray USE 2 SPRAYS IN EACH NOSTRIL DAILY  . glucose blood test strip Use as instructed  . levocetirizine (XYZAL) 5 MG tablet Take 5 mg by mouth every evening.  [name] losartan (COZAAR) 50 MG tablet TAKE 1 TABLET DAILY  . metFORMIN (GLUCOPHAGE) 1000 MG tablet TAKE 1 TABLET TWICE A DAY WITH A MEAL  . montelukast (SINGULAIR) 10 MG tablet TAKE 1 TABLET DAILY AT BEDTIME  . Multiple Vitamin (MULTIVITAMIN) tablet Take 1 tablet by mouth daily.  . mupirocin nasal ointment (BACTROBAN NASAL) 2 % Apply to affected area bid  . pantoprazole (PROTONIX) 40 MG tablet TAKE 1 TABLET DAILY  . rosuvastatin (CRESTOR) 5 MG tablet Take one tablet per week.  [name] VITAMIN E PO Take by mouth daily.   No facility-administered encounter medications on file as of 02/25/2020.    Review of Systems  Constitutional: Negative for appetite change and unexpected weight change.  HENT: Negative for congestion, sinus pressure and sore throat.   Eyes: Negative for pain and visual disturbance.  Respiratory: Negative for cough, chest tightness and shortness of breath.   Cardiovascular: Negative for chest pain, palpitations  and leg swelling.  Gastrointestinal: Negative for abdominal pain, constipation and diarrhea.  Genitourinary: Negative for difficulty urinating and dysuria.  Musculoskeletal: Negative for joint swelling and myalgias.  Skin: Negative for color change and rash.       Boil - back.  Minimal tenderness to palpation.   Neurological: Negative for dizziness, light-headedness and headaches.  Hematological: Negative for adenopathy. Does not bruise/bleed easily.  Psychiatric/Behavioral: Negative for agitation and dysphoric mood.       Objective:    Physical Exam Vitals reviewed.  Constitutional:      General: She is not  in acute distress.    Appearance: Normal appearance. She is well-developed.  HENT:     Head: Normocephalic and atraumatic.     Right Ear: External ear normal.     Left Ear: External ear normal.  Eyes:     General: No scleral icterus.       Right eye: No discharge.        Left eye: No discharge.     Conjunctiva/sclera: Conjunctivae normal.  Neck:     Thyroid: No thyromegaly.  Cardiovascular:     Rate and Rhythm: Normal rate and regular rhythm.  Pulmonary:     Effort: No tachypnea, accessory muscle usage or respiratory distress.     Breath sounds: Normal breath sounds. No decreased breath sounds or wheezing.  Chest:     Breasts:        Right: No inverted nipple, mass, nipple discharge or tenderness (no axillary adenopathy).        Left: No inverted nipple, mass, nipple discharge or tenderness (no axilarry adenopathy).  Abdominal:     General: Bowel sounds are normal.     Palpations: Abdomen is soft.     Tenderness: There is no abdominal tenderness.  Musculoskeletal:        General: No tenderness.     Cervical back: Neck supple. No tenderness.  Lymphadenopathy:     Cervical: No cervical adenopathy.  Skin:    Findings: No erythema or rash.     Comments: Small circular raise lesion/boil - back.  Minimal erythema.  No increased erythema surrounding the area.      Neurological:     Mental Status: She is alert and oriented to person, place, and time.  Psychiatric:        Behavior: Behavior normal.     BP 124/80   Pulse 85   Temp 98.1 F (36.7 C) (Oral)   Resp 16   Ht 5' 2" (1.575 m)   Wt 209 lb (94.8 kg)   LMP 07/17/2017   SpO2 98%   BMI 38.23 kg/m  Wt Readings from Last 3 Encounters:  02/25/20 209 lb (94.8 kg)  10/22/19 209 lb (94.8 kg)  06/09/19 209 lb (94.8 kg)     Lab Results  Component Value Date   WBC 8.7 06/05/2019   HGB 14.7 06/05/2019   HCT 44.6 06/05/2019   PLT 312.0 06/05/2019   GLUCOSE 131 (H) 02/23/2020   CHOL 155 02/23/2020   TRIG 74.0 02/23/2020   HDL 55.20 02/23/2020   LDLCALC 85 02/23/2020   ALT 32 02/23/2020   AST 26 02/23/2020   NA 137 02/23/2020   K 4.1 02/23/2020   CL 102 02/23/2020   CREATININE 0.96 02/23/2020   BUN 11 02/23/2020   CO2 25 02/23/2020   TSH 1.07 02/23/2020   HGBA1C 6.7 (H) 02/23/2020   MICROALBUR 2.0 (H) 02/23/2020    MM DIAG BREAST TOMO UNI LEFT  Result Date: 07/03/2019 CLINICAL DATA:  Patient was called back from screening mammogram for a possible asymmetry in the left breast. EXAM: DIGITAL DIAGNOSTIC UNILATERAL LEFT MAMMOGRAM WITH CAD AND TOMO COMPARISON:  Previous exam(s). ACR Breast Density Category b: There are scattered areas of fibroglandular density. FINDINGS: Additional imaging of the left breast was performed. No suspicious mass, malignant type microcalcifications or distortion detected. Mammographic images were processed with CAD. IMPRESSION: No evidence of malignancy in the left breast. RECOMMENDATION: Bilateral screening mammogram in 1 year is recommended. I have discussed the findings and recommendations with the patient. If applicable,  a reminder letter will be sent to the patient regarding the next appointment. BI-RADS CATEGORY  1: Negative. Electronically Signed   By: Lillia Mountain M.D.   On: 07/03/2019 11:30       Assessment & Plan:   Problem List Items Addressed  This Visit    Mild intermittent asthma in adult without complication    Breathing stable.  Doing well on breo and current regimen.  Follow.       Iron deficiency    Follow cbc and iron studies.        Health care maintenance    Physical today 02/25/20.  PAP 02/02/19 negative with negative HPV.  Colonoscopy 04/2018 - recommended f/u in 3 years.        GERD    No upper symptoms reported.  On protonix.        Essential hypertension    Blood pressure as outlined.  Doing well.  Continue losartan.  Follow pressures.  Follow metabolic panel.       Relevant Medications   rosuvastatin (CRESTOR) 5 MG tablet   Diabetes ([place]) - Primary    Is working out with a trainer as outlined.  Continue low carb diet and exercise.  Follow met b and a1c.       Relevant Medications   rosuvastatin (CRESTOR) 5 MG tablet   Other Relevant Orders   Hepatic function panel   Hepatic function panel   Boil    Small raised area on her back.  Warm compresses.  Bactroban.  Call if persistent problem.            Einar Pheasant, MD

## 2020-02-29 ENCOUNTER — Encounter: Payer: Self-pay | Admitting: Internal Medicine

## 2020-02-29 DIAGNOSIS — L0292 Furuncle, unspecified: Secondary | ICD-10-CM | POA: Insufficient documentation

## 2020-02-29 MED ORDER — CEPHALEXIN 500 MG PO CAPS: 500.0000 mg | ORAL_CAPSULE | Freq: Three times a day (TID) | ORAL | 0 refills | Status: DC

## 2020-02-29 NOTE — Assessment & Plan Note (Signed)
Follow cbc and iron studies.  

## 2020-02-29 NOTE — Assessment & Plan Note (Signed)
Is working out with a Clinical research associate as outlined.  Continue low carb diet and exercise.  Follow met b and a1c.

## 2020-02-29 NOTE — Telephone Encounter (Signed)
rx sent in for keflex.

## 2020-02-29 NOTE — Assessment & Plan Note (Signed)
Small raised area on her back.  Warm compresses.  Bactroban.  Call if persistent problem.

## 2020-02-29 NOTE — Assessment & Plan Note (Signed)
No upper symptoms reported.  On protonix.   

## 2020-02-29 NOTE — Assessment & Plan Note (Signed)
Blood pressure as outlined.  Doing well.  Continue losartan.  Follow pressures.  Follow metabolic panel.

## 2020-02-29 NOTE — Assessment & Plan Note (Signed)
Breathing stable.  Doing well on breo and current regimen.  Follow.

## 2020-03-01 DIAGNOSIS — J3089 Other allergic rhinitis: Secondary | ICD-10-CM | POA: Diagnosis not present

## 2020-03-08 DIAGNOSIS — J3089 Other allergic rhinitis: Secondary | ICD-10-CM | POA: Diagnosis not present

## 2020-03-08 DIAGNOSIS — J301 Allergic rhinitis due to pollen: Secondary | ICD-10-CM | POA: Diagnosis not present

## 2020-03-14 ENCOUNTER — Other Ambulatory Visit: Payer: Self-pay | Admitting: Internal Medicine

## 2020-03-15 DIAGNOSIS — J3089 Other allergic rhinitis: Secondary | ICD-10-CM | POA: Diagnosis not present

## 2020-03-29 DIAGNOSIS — J3089 Other allergic rhinitis: Secondary | ICD-10-CM | POA: Diagnosis not present

## 2020-04-07 ENCOUNTER — Other Ambulatory Visit (INDEPENDENT_AMBULATORY_CARE_PROVIDER_SITE_OTHER): Payer: BC Managed Care – PPO

## 2020-04-07 ENCOUNTER — Other Ambulatory Visit: Payer: Self-pay | Admitting: Internal Medicine

## 2020-04-07 ENCOUNTER — Other Ambulatory Visit: Payer: Self-pay

## 2020-04-07 DIAGNOSIS — E1165 Type 2 diabetes mellitus with hyperglycemia: Secondary | ICD-10-CM | POA: Diagnosis not present

## 2020-04-07 DIAGNOSIS — J3089 Other allergic rhinitis: Secondary | ICD-10-CM | POA: Diagnosis not present

## 2020-04-07 LAB — HEPATIC FUNCTION PANEL
ALT: 34 U/L (ref 0–35)
AST: 29 U/L (ref 0–37)
Albumin: 4.1 g/dL (ref 3.5–5.2)
Alkaline Phosphatase: 90 U/L (ref 39–117)
Bilirubin, Direct: 0.4 mg/dL — ABNORMAL HIGH (ref 0.0–0.3)
Total Bilirubin: 1 mg/dL (ref 0.2–1.2)
Total Protein: 7.1 g/dL (ref 6.0–8.3)

## 2020-04-11 ENCOUNTER — Other Ambulatory Visit: Payer: Self-pay

## 2020-04-11 ENCOUNTER — Ambulatory Visit (INDEPENDENT_AMBULATORY_CARE_PROVIDER_SITE_OTHER): Payer: BC Managed Care – PPO | Admitting: Dermatology

## 2020-04-11 DIAGNOSIS — L720 Epidermal cyst: Secondary | ICD-10-CM | POA: Diagnosis not present

## 2020-04-11 NOTE — Progress Notes (Signed)
   Follow-Up Visit   Subjective  Donna Hensley is a 54 y.o. female who presents for the following: Cyst (Right mid back at braline. Patient here for excision.).   The following portions of the chart were reviewed this encounter and updated as appropriate:       Review of Systems:  No other skin or systemic complaints except as noted in HPI or Assessment and Plan.  Objective  Well appearing patient in no apparent distress; mood and affect are within normal limits.  A focused examination was performed including back. Relevant physical exam findings are noted in the Assessment and Plan.  Objective  Right Mid Back at Braline: Indistinct indurated Subcutaneous nodule with central depressed scar, 1.1 x 0.8cm, h/o I&D   Assessment & Plan  Epidermal inclusion cyst Right Mid Back at Braline  Skin excision - Right Mid Back at Braline  Lesion length (cm):  1.1 Lesion width (cm):  0.8 Margin per side (cm):  0.2 Total excision diameter (cm):  1.5 Informed consent: discussed and consent obtained   Timeout: patient name, date of birth, surgical site, and procedure verified   Procedure prep:  Patient was prepped and draped in usual sterile fashion Prep type:  Povidone-iodine Anesthesia: the lesion was anesthetized in a standard fashion   Anesthetic:  1% lidocaine w/ epinephrine 1-100,000 buffered w/ 8.4% NaHCO3 (16cc) Instrument used: #15 blade   Hemostasis achieved with: pressure and electrodesiccation   Outcome: patient tolerated procedure well with no complications    Skin repair - Right Mid Back at Braline Complexity:  Intermediate Final length (cm):  3 Reason for type of repair: reduce tension to allow closure, reduce the risk of dehiscence, infection, and necrosis and reduce subcutaneous dead space and avoid a hematoma   Undermining: edges undermined   Subcutaneous layers (deep stitches):  Suture size:  3-0 Suture type: Vicryl (polyglactin 910)   Stitches:  Buried  vertical mattress Fine/surface layer approximation (top stitches):  Suture size:  3-0 Suture type: nylon   Stitches: simple interrupted   Suture removal (days):  7 Hemostasis achieved with: suture Outcome: patient tolerated procedure well with no complications   Post-procedure details: sterile dressing applied and wound care instructions given   Dressing type: pressure dressing (mupirocin)    Specimen 1 - Surgical pathology Differential Diagnosis: Cyst vs other Check Margins: No Subcutaneous nodule 1.1 x 0.8cm  Return in about 1 week (around 04/18/2020) for suture removal.  I, Jamesetta Orleans, CMA, am acting as scribe for Brendolyn Patty, MD .  Documentation: I have reviewed the above documentation for accuracy and completeness, and I agree with the above.  Brendolyn Patty MD

## 2020-04-11 NOTE — Patient Instructions (Signed)

## 2020-04-13 ENCOUNTER — Telehealth: Payer: Self-pay

## 2020-04-13 ENCOUNTER — Telehealth: Payer: Self-pay | Admitting: Internal Medicine

## 2020-04-13 NOTE — Telephone Encounter (Signed)
See result note.  

## 2020-04-13 NOTE — Telephone Encounter (Signed)
Pt doing well after Monday's surgery.Donna Hensley

## 2020-04-13 NOTE — Telephone Encounter (Signed)
Pt called returning your call about lab results

## 2020-04-14 DIAGNOSIS — J3089 Other allergic rhinitis: Secondary | ICD-10-CM | POA: Diagnosis not present

## 2020-04-18 ENCOUNTER — Other Ambulatory Visit: Payer: Self-pay

## 2020-04-18 ENCOUNTER — Ambulatory Visit: Payer: BC Managed Care – PPO | Admitting: Dermatology

## 2020-04-18 DIAGNOSIS — L720 Epidermal cyst: Secondary | ICD-10-CM

## 2020-04-18 DIAGNOSIS — Z4802 Encounter for removal of sutures: Secondary | ICD-10-CM

## 2020-04-18 NOTE — Progress Notes (Signed)
   Follow-Up Visit   Subjective  Donna Hensley is a 54 y.o. female who presents for the following: Suture / Staple Removal (1 week suture removal for excision of biopsy proven epidermal inclusion cyst.).  Healing well.    The following portions of the chart were reviewed this encounter and updated as appropriate:      Review of Systems: No other skin or systemic complaints except as noted in HPI or Assessment and Plan.   Objective  Well appearing patient in no apparent distress; mood and affect are within normal limits.  A focused examination was performed including back. Relevant physical exam findings are noted in the Assessment and Plan.  Objective  Right mid back at braline: Incision site is clean, dry and intact  Post excision suture removal for biopsy proven epidermal inclusion cyst.   Assessment & Plan  Epidermal inclusion cyst Right mid back at braline  Encounter for Removal of Sutures - Incision site at the right midback at braline is clean, dry and intact - Wound cleansed, sutures removed, wound cleansed and steri strips applied.  - Discussed pathology results showing epidermal inclusion cyst  - Patient advised to keep steri-strips dry until they fall off. - Scars remodel for a full year. - Once steri-strips fall off, patient can apply over-the-counter silicone scar cream each night to help with scar remodeling if desired. - Patient advised to call with any concerns or if they notice any new or changing lesions.  Documentation: I have reviewed the above documentation for accuracy and completeness, and I agree with the above.  Brendolyn Patty MD Return if symptoms worsen or fail to improve.

## 2020-04-18 NOTE — Progress Notes (Deleted)
   Follow-Up Visit   Subjective  Donna Hensley is a 53 y.o. female who presents for the following: Suture / Staple Removal (1 week suture removal for excision of biopsy proven epidermal inclusion cyst.).   The following portions of the chart were reviewed this encounter and updated as appropriate:      {Review of Systems:34166::"No other skin or systemic complaints."}  Objective  Well appearing patient in no apparent distress; mood and affect are within normal limits.  {[id number]::"P full examination was performed including scalp, head, eyes, ears, nose, lips, neck, chest, axillae, abdomen, back, buttocks, bilateral upper extremities, bilateral lower extremities, hands, feet, fingers, toes, fingernails, and toenails. All findings within normal limits unless otherwise noted below."}  Objective  Right mid back at braline: Post excision suture removal for biopsy proven epidermal inclusion cyst.    Assessment & Plan  Epidermal inclusion cyst Right mid back at braline  Encounter for Removal of Sutures - Incision site at the right midback at braline is clean, dry and intact - Wound cleansed, sutures removed, wound cleansed and steri strips applied.  - Discussed pathology results showing epidermal inclusion cyst  - Patient advised to keep steri-strips dry until they fall off. - Scars remodel for a full year. - Once steri-strips fall off, patient can apply over-the-counter silicone scar cream each night to help with scar remodeling if desired. - Patient advised to call with any concerns or if they notice any new or changing lesions.    Return if symptoms worsen or fail to improve.

## 2020-04-19 DIAGNOSIS — J3089 Other allergic rhinitis: Secondary | ICD-10-CM | POA: Diagnosis not present

## 2020-04-28 DIAGNOSIS — J3089 Other allergic rhinitis: Secondary | ICD-10-CM | POA: Diagnosis not present

## 2020-05-05 DIAGNOSIS — J3089 Other allergic rhinitis: Secondary | ICD-10-CM | POA: Diagnosis not present

## 2020-05-09 ENCOUNTER — Other Ambulatory Visit (INDEPENDENT_AMBULATORY_CARE_PROVIDER_SITE_OTHER): Payer: BC Managed Care – PPO

## 2020-05-09 ENCOUNTER — Other Ambulatory Visit: Payer: Self-pay

## 2020-05-09 LAB — HEPATIC FUNCTION PANEL
ALT: 33 U/L (ref 0–35)
AST: 25 U/L (ref 0–37)
Albumin: 4.4 g/dL (ref 3.5–5.2)
Alkaline Phosphatase: 90 U/L (ref 39–117)
Bilirubin, Direct: 0.3 mg/dL (ref 0.0–0.3)
Total Bilirubin: 0.8 mg/dL (ref 0.2–1.2)
Total Protein: 7.2 g/dL (ref 6.0–8.3)

## 2020-05-12 DIAGNOSIS — J3089 Other allergic rhinitis: Secondary | ICD-10-CM | POA: Diagnosis not present

## 2020-05-13 ENCOUNTER — Telehealth: Payer: Self-pay | Admitting: Internal Medicine

## 2020-05-13 DIAGNOSIS — J40 Bronchitis, not specified as acute or chronic: Secondary | ICD-10-CM

## 2020-05-13 MED ORDER — ALBUTEROL SULFATE HFA 108 (90 BASE) MCG/ACT IN AERS
2.0000 | INHALATION_SPRAY | Freq: Four times a day (QID) | RESPIRATORY_TRACT | 2 refills | Status: DC | PRN
Start: 2020-05-13 — End: 2020-11-09

## 2020-05-13 NOTE — Telephone Encounter (Signed)
Pt needs a refill on albuterol (PROVENTIL HFA;VENTOLIN HFA) 108 (90 Base) MCG/ACT inhaler sent to [facility]

## 2020-05-19 DIAGNOSIS — J3089 Other allergic rhinitis: Secondary | ICD-10-CM | POA: Diagnosis not present

## 2020-05-26 DIAGNOSIS — J3089 Other allergic rhinitis: Secondary | ICD-10-CM | POA: Diagnosis not present

## 2020-05-30 ENCOUNTER — Telehealth: Payer: BC Managed Care – PPO | Admitting: Physician Assistant

## 2020-05-30 DIAGNOSIS — B9789 Other viral agents as the cause of diseases classified elsewhere: Secondary | ICD-10-CM

## 2020-05-30 DIAGNOSIS — J019 Acute sinusitis, unspecified: Secondary | ICD-10-CM | POA: Diagnosis not present

## 2020-05-30 MED ORDER — IPRATROPIUM BROMIDE 0.03 % NA SOLN: 2.0000 | Freq: Two times a day (BID) | NASAL | 0 refills | Status: DC

## 2020-05-30 NOTE — Progress Notes (Signed)
I have spent 5 minutes in review of e-visit questionnaire, review and updating patient chart, medical decision making and response to patient.   Grisela Mesch Cody Hommer Cunliffe, PA-C    

## 2020-05-30 NOTE — Progress Notes (Signed)
We are sorry that you are not feeling well.  Here is how we plan to help!  Based on what you have shared with me it looks like you have sinusitis.  Sinusitis is inflammation and infection in the sinus cavities of the head.  Based on your presentation I believe you most likely have Acute Viral Sinusitis.This is an infection most likely caused by a virus. There is not specific treatment for viral sinusitis other than to help you with the symptoms until the infection runs its course.  You may use an oral decongestant such as Mucinex D or if you have glaucoma or high blood pressure use plain Mucinex. Saline nasal spray help and can safely be used as often as needed for congestion, I have prescribed: Ipratropium Bromide nasal spray 0.03% 2 sprays in eah nostril 2-3 times a day. Keep up with your routine allergy medications as well. We are unable to treat with antibiotics with such a short duration of symptoms as it is most likely viral in nature. Giving your history of diabetes and asthma, I also recommend you be covid tested to be cautious. You can go to [email or web address] to find testing sites near you and to schedule an appointment.   Some authorities believe that zinc sprays or the use of Echinacea may shorten the course of your symptoms.  Sinus infections are not as easily transmitted as other respiratory infection, however we still recommend that you avoid close contact with loved ones, especially the very young and elderly.  Remember to wash your hands thoroughly throughout the day as this is the number one way to prevent the spread of infection!  Home Care:  Only take medications as instructed by your medical team.  Do not take these medications with alcohol.  A steam or ultrasonic humidifier can help congestion.  You can place a towel over your head and breathe in the steam from hot water coming from a faucet.  Avoid close contacts especially the very young and the elderly.  Cover your  mouth when you cough or sneeze.  Always remember to wash your hands.  Get Help Right Away If:  You develop worsening fever or sinus pain.  You develop a severe head ache or visual changes.  Your symptoms persist after you have completed your treatment plan.  Make sure you  Understand these instructions.  Will watch your condition.  Will get help right away if you are not doing well or get worse.  Your e-visit answers were reviewed by a board certified advanced clinical practitioner to complete your personal care plan.  Depending on the condition, your plan could have included both over the counter or prescription medications.  If there is a problem please reply  once you have received a response from your provider.  Your safety is important to [place].  If you have drug allergies check your prescription carefully.    You can use MyChart to ask questions about today's visit, request a non-urgent call back, or ask for a work or school excuse for 24 hours related to this e-Visit. If it has been greater than 24 hours you will need to follow up with your provider, or enter a new e-Visit to address those concerns.  You will get an e-mail in the next two days asking about your experience.  I hope that your e-visit has been valuable and will speed your recovery. Thank you for using e-visits.

## 2020-06-02 ENCOUNTER — Encounter: Payer: Self-pay | Admitting: *Deleted

## 2020-06-02 DIAGNOSIS — J3089 Other allergic rhinitis: Secondary | ICD-10-CM | POA: Diagnosis not present

## 2020-06-09 DIAGNOSIS — J3089 Other allergic rhinitis: Secondary | ICD-10-CM | POA: Diagnosis not present

## 2020-06-10 ENCOUNTER — Encounter: Payer: Self-pay | Admitting: Internal Medicine

## 2020-06-10 MED ORDER — CLOTRIMAZOLE 10 MG MT TROC: 10.0000 mg | Freq: Three times a day (TID) | OROMUCOSAL | 0 refills | Status: DC | PRN

## 2020-06-10 NOTE — Telephone Encounter (Signed)
rx sent in for mycelex troches.

## 2020-06-16 DIAGNOSIS — J3089 Other allergic rhinitis: Secondary | ICD-10-CM | POA: Diagnosis not present

## 2020-06-23 ENCOUNTER — Other Ambulatory Visit: Payer: Self-pay

## 2020-06-23 DIAGNOSIS — J3089 Other allergic rhinitis: Secondary | ICD-10-CM | POA: Diagnosis not present

## 2020-06-23 MED ORDER — CLOTRIMAZOLE 10 MG MT TROC: 10.0000 mg | Freq: Three times a day (TID) | OROMUCOSAL | 0 refills | Status: DC | PRN

## 2020-06-23 NOTE — Telephone Encounter (Signed)
Please call pt given persistent problems.  If it is better and almost gone, but still having issues, I can call in a refill.  If persistent and no significant improvement,  may need to get ENT to evaluate.  Let me know if agreeable.

## 2020-06-23 NOTE — Telephone Encounter (Signed)
Patient says that it is much better but has not cleared up yet. I have sent in refill. Patient comes in on Wednesday. Will discuss ENT referral then.

## 2020-06-24 ENCOUNTER — Telehealth: Payer: Self-pay | Admitting: *Deleted

## 2020-06-24 NOTE — Telephone Encounter (Signed)
Please place future orders for lab appt.  

## 2020-06-25 ENCOUNTER — Other Ambulatory Visit: Payer: Self-pay | Admitting: Internal Medicine

## 2020-06-25 DIAGNOSIS — Z13220 Encounter for screening for lipoid disorders: Secondary | ICD-10-CM

## 2020-06-25 DIAGNOSIS — E1165 Type 2 diabetes mellitus with hyperglycemia: Secondary | ICD-10-CM

## 2020-06-25 DIAGNOSIS — I10 Essential (primary) hypertension: Secondary | ICD-10-CM

## 2020-06-25 NOTE — Progress Notes (Signed)
Orders placed for f/u labs.  

## 2020-06-25 NOTE — Telephone Encounter (Signed)
Orders placed for labs

## 2020-06-27 ENCOUNTER — Other Ambulatory Visit: Payer: Self-pay

## 2020-06-27 ENCOUNTER — Other Ambulatory Visit (INDEPENDENT_AMBULATORY_CARE_PROVIDER_SITE_OTHER): Payer: BC Managed Care – PPO

## 2020-06-27 DIAGNOSIS — Z13220 Encounter for screening for lipoid disorders: Secondary | ICD-10-CM

## 2020-06-27 DIAGNOSIS — E1165 Type 2 diabetes mellitus with hyperglycemia: Secondary | ICD-10-CM | POA: Diagnosis not present

## 2020-06-27 DIAGNOSIS — I10 Essential (primary) hypertension: Secondary | ICD-10-CM | POA: Diagnosis not present

## 2020-06-27 LAB — HEMOGLOBIN A1C: Hgb A1c MFr Bld: 6.3 % (ref 4.6–6.5)

## 2020-06-27 LAB — BASIC METABOLIC PANEL
BUN: 11 mg/dL (ref 6–23)
CO2: 25 mEq/L (ref 19–32)
Calcium: 9.7 mg/dL (ref 8.4–10.5)
Chloride: 103 mEq/L (ref 96–112)
Creatinine, Ser: 0.91 mg/dL (ref 0.40–1.20)
GFR: 72.68 mL/min (ref >60.00)
Glucose, Bld: 143 mg/dL — ABNORMAL HIGH (ref 70–99)
Potassium: 3.8 mEq/L (ref 3.5–5.1)
Sodium: 137 mEq/L (ref 135–145)

## 2020-06-27 LAB — CBC WITH DIFFERENTIAL/PLATELET
Basophils Absolute: 0.1 10*3/uL (ref 0.0–0.1)
Basophils Relative: 0.6 % (ref 0.0–3.0)
Eosinophils Absolute: 0.3 10*3/uL (ref 0.0–0.7)
Eosinophils Relative: 3 % (ref 0.0–5.0)
HCT: 43.2 % (ref 36.0–46.0)
Hemoglobin: 14.6 g/dL (ref 12.0–15.0)
Lymphocytes Relative: 31.3 % (ref 12.0–46.0)
Lymphs Abs: 2.8 10*3/uL (ref 0.7–4.0)
MCHC: 33.9 g/dL (ref 30.0–36.0)
MCV: 85.8 fl (ref 78.0–100.0)
Monocytes Absolute: 0.6 10*3/uL (ref 0.1–1.0)
Monocytes Relative: 7.1 % (ref 3.0–12.0)
Neutro Abs: 5.2 10*3/uL (ref 1.4–7.7)
Neutrophils Relative %: 58 % (ref 43.0–77.0)
Platelets: 315 10*3/uL (ref 150.0–400.0)
RBC: 5.03 Mil/uL (ref 3.87–5.11)
RDW: 14.4 % (ref 11.5–15.5)
WBC: 9.1 10*3/uL (ref 4.0–10.5)

## 2020-06-27 LAB — LIPID PANEL
Cholesterol: 140 mg/dL (ref 0–200)
HDL: 48.7 mg/dL (ref >39.00)
LDL Cholesterol: 79 mg/dL (ref 0–99)
NonHDL: 91.26
Total CHOL/HDL Ratio: 3
Triglycerides: 60 mg/dL (ref 0.0–149.0)
VLDL: 12 mg/dL (ref 0.0–40.0)

## 2020-06-27 LAB — HEPATIC FUNCTION PANEL
ALT: 35 U/L (ref 0–35)
AST: 29 U/L (ref 0–37)
Albumin: 4.1 g/dL (ref 3.5–5.2)
Alkaline Phosphatase: 85 U/L (ref 39–117)
Bilirubin, Direct: 0.4 mg/dL — ABNORMAL HIGH (ref 0.0–0.3)
Total Bilirubin: 1.1 mg/dL (ref 0.2–1.2)
Total Protein: 7.3 g/dL (ref 6.0–8.3)

## 2020-06-29 ENCOUNTER — Other Ambulatory Visit: Payer: Self-pay | Admitting: Physician Assistant

## 2020-06-29 ENCOUNTER — Other Ambulatory Visit: Payer: Self-pay

## 2020-06-29 ENCOUNTER — Ambulatory Visit: Payer: BC Managed Care – PPO | Admitting: Internal Medicine

## 2020-06-29 VITALS — BP 122/72 | HR 70 | Temp 97.6°F | Resp 16 | Ht 62.0 in | Wt 202.0 lb

## 2020-06-29 DIAGNOSIS — K219 Gastro-esophageal reflux disease without esophagitis: Secondary | ICD-10-CM | POA: Diagnosis not present

## 2020-06-29 DIAGNOSIS — Z1159 Encounter for screening for other viral diseases: Secondary | ICD-10-CM

## 2020-06-29 DIAGNOSIS — J4520 Mild intermittent asthma, uncomplicated: Secondary | ICD-10-CM

## 2020-06-29 DIAGNOSIS — Z114 Encounter for screening for human immunodeficiency virus [HIV]: Secondary | ICD-10-CM

## 2020-06-29 DIAGNOSIS — Z1231 Encounter for screening mammogram for malignant neoplasm of breast: Secondary | ICD-10-CM

## 2020-06-29 DIAGNOSIS — E611 Iron deficiency: Secondary | ICD-10-CM

## 2020-06-29 DIAGNOSIS — I10 Essential (primary) hypertension: Secondary | ICD-10-CM

## 2020-06-29 DIAGNOSIS — E1165 Type 2 diabetes mellitus with hyperglycemia: Secondary | ICD-10-CM

## 2020-06-29 LAB — HM DIABETES FOOT EXAM

## 2020-06-29 NOTE — Progress Notes (Signed)
Patient ID: Donna Hensley, female   DOB: November 08, 1965, 55 y.o.   MRN: [id number]   Subjective:    Patient ID: Donna Hensley, female    DOB: 08-13-1965, 55 y.o.   MRN: [id number]  HPI This visit occurred during the SARS-CoV-2 public health emergency.  Safety protocols were in place, including screening questions prior to the visit, additional usage of staff PPE, and extensive cleaning of exam room while observing appropriate contact time as indicated for disinfecting solutions.  Patient here for a scheduled follow up. Here to follow up regarding her blood sugar, blood pressure and cholesterol.  She reports she is doing relatively well.  Breathing stable.  No cough or congestion.  Discussed diet and exercise.  Doing better.  Sugars better.  a1c 06/27/20 - 6.3.  No acid reflux or abdominal pain reported.  Bowels moving.  Using mycelex troches - mouth improved.  No chest pain.  Handling stress.    Past Medical History:  Diagnosis Date  . Allergy   . Asthma   . GERD (gastroesophageal reflux disease)   . Gestational diabetes   . Hypertension    Past Surgical History:  Procedure Laterality Date  . APPENDECTOMY  1986  . [place]  . CHOLECYSTECTOMY  2013  . COLONOSCOPY WITH PROPOFOL N/A 05/05/2018   Procedure: COLONOSCOPY WITH PROPOFOL;  Surgeon: [name], MD;  Location: [facility] ENDOSCOPY;  Service: Endoscopy;  Laterality: N/A;  . ESOPHAGOGASTRODUODENOSCOPY (EGD) WITH PROPOFOL N/A 05/05/2018   Procedure: ESOPHAGOGASTRODUODENOSCOPY (EGD) WITH PROPOFOL;  Surgeon: [name], MD;  Location: [facility] ENDOSCOPY;  Service: Endoscopy;  Laterality: N/A;  . TONSILLECTOMY  1987  . wisdom tooth removal     Family History  Problem Relation Age of Onset  . Hyperlipidemia Mother   . Osteoporosis Mother   . Hypertension Father   . Arthritis Father   . Sleep apnea Father   . Heart disease Father   . Sleep apnea Brother   . Arthritis Paternal Grandmother   . Diabetes  Paternal Grandmother   . Breast cancer Neg Hx    Social History   Socioeconomic History  . Marital status: Married    Spouse name: Not on file  . Number of children: 2  . Years of education: Not on file  . Highest education level: Not on file  Occupational History  . 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 Smoking status: Former Smoker    Packs/day: 0.25    Years: 5.00    Pack years: 1.25    Types: Cigarettes    Quit date: 09/28/1988    Years since quitting: 31.7  . Smokeless tobacco: Never Used  . Tobacco comment: smoked briefly as a teenager  Vaping Use  . Vaping Use: Never used  Substance and Sexual Activity  . Alcohol use: Yes    Alcohol/week: 1.0 standard drink    Types: 1 Glasses of wine per week    Comment: Occasional 1-2 per month  . Drug use: Never  . Sexual activity: Not on file  Other Topics Concern  . Not on file  Social History Narrative   Regular exercise-yes, walk   Diet: fast food, trying to improve diet         Social Determinants of Health   Financial Resource Strain: Not on file  Food Insecurity: Not on file  Transportation Needs: Not on file  Physical Activity: Not on file  Stress: Not  on file  Social Connections: Not on file    Outpatient Encounter Medications as of 06/29/2020  Medication Sig  . albuterol (VENTOLIN HFA) 108 (90 Base) MCG/ACT inhaler Inhale 2 puffs into the lungs every 6 (six) hours as needed for wheezing.  [name] azelastine (OPTIVAR) 0.05 % ophthalmic solution Place 1 drop into both eyes daily.  [name] BREO ELLIPTA 200-25 MCG/INH AEPB Inhale 1 puff into the lungs daily.   . Calcium-Magnesium-Vitamin D (CALCIUM 500 PO) Take 1 tablet by mouth daily.  . Cholecalciferol (VITAMIN D3) 1000 units CAPS Take 1 capsule by mouth daily.  . clotrimazole (MYCELEX) 10 MG troche Take 1 tablet (10 mg total) by mouth 3 (three) times daily as needed.  Donna Hensley (EUCRISA) 2 % OINT Use as directed.  . Cyanocobalamin  (VITAMIN B 12 PO) Take 1 tablet by mouth daily.  . fluticasone (FLONASE) 50 MCG/ACT nasal spray USE 2 SPRAYS IN EACH NOSTRIL DAILY  . glucose blood test strip Use as instructed  . levocetirizine (XYZAL) 5 MG tablet Take 5 mg by mouth every evening.  [name] losartan (COZAAR) 50 MG tablet TAKE 1 TABLET DAILY  . metFORMIN (GLUCOPHAGE) 1000 MG tablet TAKE 1 TABLET TWICE A DAY WITH A MEAL  . montelukast (SINGULAIR) 10 MG tablet TAKE 1 TABLET DAILY AT BEDTIME  . Multiple Vitamin (MULTIVITAMIN) tablet Take 1 tablet by mouth daily.  . mupirocin nasal ointment (BACTROBAN NASAL) 2 % Apply to affected area bid  . pantoprazole (PROTONIX) 40 MG tablet TAKE 1 TABLET DAILY  . rosuvastatin (CRESTOR) 5 MG tablet Take one tablet per week.  [name] VITAMIN E PO Take by mouth daily.  . [DISCONTINUED] cephALEXin (KEFLEX) 500 MG capsule Take 1 capsule (500 mg total) by mouth 3 (three) times daily.  . [DISCONTINUED] fluconazole (DIFLUCAN) 150 MG tablet Take one tablet x 1 dose.  If no improvement, then may repeat x 1 in 3 days.  . [DISCONTINUED] ipratropium (ATROVENT) 0.03 % nasal spray Place 2 sprays into both nostrils every 12 (twelve) hours.   No facility-administered encounter medications on file as of 06/29/2020.    Review of Systems  Constitutional: Negative for appetite change and unexpected weight change.  HENT: Negative for congestion.   Respiratory: Negative for cough, chest tightness and shortness of breath.   Cardiovascular: Negative for chest pain, palpitations and leg swelling.  Gastrointestinal: Negative for abdominal pain, diarrhea, nausea and vomiting.  Genitourinary: Negative for difficulty urinating and dysuria.  Musculoskeletal: Negative for joint swelling and myalgias.  Skin: Negative for color change and rash.  Neurological: Negative for dizziness, light-headedness and headaches.  Psychiatric/Behavioral: Negative.  Negative for agitation and dysphoric mood.       Objective:    Physical  Exam Constitutional:      General: She is not in acute distress.    Appearance: Normal appearance.  HENT:     Head: Normocephalic and atraumatic.     Right Ear: External ear normal.     Left Ear: External ear normal.     Mouth/Throat:     Mouth: Oropharynx is clear and moist.  Eyes:     General: No scleral icterus.       Right eye: No discharge.        Left eye: No discharge.     Conjunctiva/sclera: Conjunctivae normal.  Neck:     Thyroid: No thyromegaly.  Cardiovascular:     Rate and Rhythm: Normal rate and regular rhythm.  Pulmonary:     Effort: No respiratory distress.  Breath sounds: Normal breath sounds. No wheezing.  Abdominal:     General: Bowel sounds are normal.     Palpations: Abdomen is soft.     Tenderness: There is no abdominal tenderness.  Musculoskeletal:        General: No swelling, tenderness or edema.     Cervical back: Neck supple. No tenderness.  Lymphadenopathy:     Cervical: No cervical adenopathy.  Skin:    Findings: No erythema or rash.  Neurological:     Mental Status: She is alert.  Psychiatric:        Mood and Affect: Mood normal.        Behavior: Behavior normal.     BP 122/72   Pulse 70   Temp 97.6 F (36.4 C) (Oral)   Resp 16   Ht $R'5\' 2"'ZH$  (1.575 m)   Wt 202 lb (91.6 kg)   LMP 07/17/2017   SpO2 98%   BMI 36.95 kg/m  Wt Readings from Last 3 Encounters:  06/29/20 202 lb (91.6 kg)  02/25/20 209 lb (94.8 kg)  10/22/19 209 lb (94.8 kg)     Lab Results  Component Value Date   WBC 9.1 06/27/2020   HGB 14.6 06/27/2020   HCT 43.2 06/27/2020   PLT 315.0 06/27/2020   GLUCOSE 143 (H) 06/27/2020   CHOL 140 06/27/2020   TRIG 60.0 06/27/2020   HDL 48.70 06/27/2020   LDLCALC 79 06/27/2020   ALT 35 06/27/2020   AST 29 06/27/2020   NA 137 06/27/2020   K 3.8 06/27/2020   CL 103 06/27/2020   CREATININE 0.91 06/27/2020   BUN 11 06/27/2020   CO2 25 06/27/2020   TSH 1.07 02/23/2020   HGBA1C 6.3 06/27/2020   MICROALBUR 2.0 (H)  02/23/2020    MM DIAG BREAST TOMO UNI LEFT  Result Date: 07/03/2019 CLINICAL DATA:  Patient was called back from screening mammogram for a possible asymmetry in the left breast. EXAM: DIGITAL DIAGNOSTIC UNILATERAL LEFT MAMMOGRAM WITH CAD AND TOMO COMPARISON:  Previous exam(s). ACR Breast Density Category b: There are scattered areas of fibroglandular density. FINDINGS: Additional imaging of the left breast was performed. No suspicious mass, malignant type microcalcifications or distortion detected. Mammographic images were processed with CAD. IMPRESSION: No evidence of malignancy in the left breast. RECOMMENDATION: Bilateral screening mammogram in 1 year is recommended. I have discussed the findings and recommendations with the patient. If applicable, a reminder letter will be sent to the patient regarding the next appointment. BI-RADS CATEGORY  1: Negative. Electronically Signed   By: Lillia Mountain M.D.   On: 07/03/2019 11:30       Assessment & Plan:   Problem List Items Addressed This Visit    Diabetes ([place])    Low carb diet and exercise.  Sugar improved. Weight down.  Follow met b and a1c.       Relevant Orders   Hemoglobin A1c   Hepatic function panel   Lipid panel   Essential hypertension    Blood pressure doing well.  Continue losartan.  Follow pressures.  Follow metabolic panel.       Relevant Orders   Basic metabolic panel   GERD    No upper symptoms reported.  On protonix.       Iron deficiency    Follow cbc and iron studies.       Mild intermittent asthma in adult without complication    Breathing stable.  Doing well on breo. Has rescue inhaler if needed.  Follow.         Other Visit Diagnoses    Encounter for screening mammogram for malignant neoplasm of breast    -  Primary   Relevant Orders   MM 3D SCREEN BREAST BILATERAL   Screening for HIV (human immunodeficiency virus)       Relevant Orders   HIV antibody (with reflex)   Need for hepatitis C screening test        Relevant Orders   Hepatitis C antibody       Einar Pheasant, MD

## 2020-06-29 NOTE — Telephone Encounter (Signed)
rx sent in for atrovent nasal spray.

## 2020-06-30 DIAGNOSIS — J3089 Other allergic rhinitis: Secondary | ICD-10-CM | POA: Diagnosis not present

## 2020-07-04 ENCOUNTER — Encounter: Payer: Self-pay | Admitting: Internal Medicine

## 2020-07-04 NOTE — Assessment & Plan Note (Signed)
Blood pressure doing well.  Continue losartan.  Follow pressures.  Follow metabolic panel.

## 2020-07-04 NOTE — Assessment & Plan Note (Signed)
Low carb diet and exercise.  Sugar improved. Weight down.  Follow met b and a1c.

## 2020-07-04 NOTE — Assessment & Plan Note (Signed)
Follow cbc and iron studies.  

## 2020-07-04 NOTE — Assessment & Plan Note (Signed)
No upper symptoms reported.  On protonix.   

## 2020-07-04 NOTE — Assessment & Plan Note (Signed)
Breathing stable.  Doing well on breo. Has rescue inhaler if needed.  Follow.

## 2020-07-07 DIAGNOSIS — J3089 Other allergic rhinitis: Secondary | ICD-10-CM | POA: Diagnosis not present

## 2020-07-14 DIAGNOSIS — J3089 Other allergic rhinitis: Secondary | ICD-10-CM | POA: Diagnosis not present

## 2020-07-18 ENCOUNTER — Other Ambulatory Visit: Payer: Self-pay

## 2020-07-18 ENCOUNTER — Ambulatory Visit
Admission: RE | Admit: 2020-07-18 | Discharge: 2020-07-18 | Disposition: A | Discharge status: Home / Self Care | Payer: BC Managed Care – PPO | Source: Ambulatory Visit | Attending: Internal Medicine | Admitting: Internal Medicine

## 2020-07-18 DIAGNOSIS — Z1231 Encounter for screening mammogram for malignant neoplasm of breast: Secondary | ICD-10-CM | POA: Insufficient documentation

## 2020-07-19 ENCOUNTER — Other Ambulatory Visit: Payer: Self-pay | Admitting: Internal Medicine

## 2020-07-19 ENCOUNTER — Telehealth: Payer: Self-pay

## 2020-07-19 DIAGNOSIS — R928 Other abnormal and inconclusive findings on diagnostic imaging of breast: Secondary | ICD-10-CM

## 2020-07-19 NOTE — Telephone Encounter (Signed)
Pt states that she is returning your call and is not sure what you called for. Please advise

## 2020-07-19 NOTE — Telephone Encounter (Signed)
Pt aware of results 

## 2020-07-19 NOTE — Progress Notes (Signed)
Order placed for f/u left breast mammogram and ultrasound.   

## 2020-07-21 DIAGNOSIS — J3089 Other allergic rhinitis: Secondary | ICD-10-CM | POA: Diagnosis not present

## 2020-07-28 ENCOUNTER — Other Ambulatory Visit: Payer: Self-pay

## 2020-07-28 ENCOUNTER — Ambulatory Visit
Admission: RE | Admit: 2020-07-28 | Discharge: 2020-07-28 | Disposition: A | Discharge status: Home / Self Care | Payer: BC Managed Care – PPO | Source: Ambulatory Visit | Attending: Internal Medicine | Admitting: Internal Medicine

## 2020-07-28 DIAGNOSIS — R928 Other abnormal and inconclusive findings on diagnostic imaging of breast: Secondary | ICD-10-CM

## 2020-07-28 DIAGNOSIS — R922 Inconclusive mammogram: Secondary | ICD-10-CM | POA: Diagnosis not present

## 2020-07-29 DIAGNOSIS — J3089 Other allergic rhinitis: Secondary | ICD-10-CM | POA: Diagnosis not present

## 2020-07-30 ENCOUNTER — Encounter: Payer: Self-pay | Admitting: Physician Assistant

## 2020-07-30 ENCOUNTER — Telehealth: Payer: BC Managed Care – PPO | Admitting: Physician Assistant

## 2020-07-30 ENCOUNTER — Encounter: Payer: Self-pay | Admitting: Internal Medicine

## 2020-07-30 DIAGNOSIS — K148 Other diseases of tongue: Secondary | ICD-10-CM

## 2020-07-30 DIAGNOSIS — R928 Other abnormal and inconclusive findings on diagnostic imaging of breast: Secondary | ICD-10-CM

## 2020-07-30 DIAGNOSIS — B370 Candidal stomatitis: Secondary | ICD-10-CM

## 2020-07-30 NOTE — Progress Notes (Signed)
Based on what you shared with me, I feel your condition warrants further evaluation and I recommend that you be seen for a face to face office visit or you may submit a video visit.   [name],  The Evisit you have selected is for mouth ulcers or canker sore. For further evaluation of your possible thrush, I recommend that you submit a video visit or have a face to face visit with your doctor. Hope you feel better!   NOTE: If you entered your credit card information for this eVisit, you will not be charged. You may see a "hold" on your card for the $35 but that hold will drop off and you will not have a charge processed.   If you are having a true medical emergency please call 911.      For an urgent face to face visit, [place] has five urgent care centers for your convenience:     [facility] [employer] [facility] [place] Get Driving Directions [phone] [place], [place] [address] . 10 am - 6pm Monday - Friday    [facility] [place] [facility] Get Driving Directions [phone] [address] [place], [place] [address] . 10 am to 8 pm Monday-Friday . 12 pm to 8 pm [facility] Get Driving Directions [phone] [place], [place], [place] [address] . 8 am to 8 pm Monday-Friday . 9 am to 6 pm Saturday . 11 am to 6 pm Sunday     [facility] at [facility] Get Driving Directions  [phone] [address] [place], [place] [address] . 8 am to 8 pm Monday-Friday . 8 am to 4 pm [facility] at [place] Get Driving Directions [phone] [address] [place], [place] [address] . 12 pm to 6 pm Monday-Friday      Your e-visit answers were reviewed by a board certified advanced clinical practitioner to complete your personal care plan.  Thank you for using e-Visits.    I spent  5-10 minutes on review and completion of this note- Lacy Duverney [facility]

## 2020-08-01 NOTE — Telephone Encounter (Signed)
Patient returning Dr. Bary Leriche phone call from 07/31/2020. Please call patient back at [phone]. She said any time is good.

## 2020-08-02 NOTE — Telephone Encounter (Signed)
Called and spoke to [place] about her mammogram and persistent thrush.  Will refer to ENT for evaluation.  She also prefers to see Dr Bary Castilla for evaluation of abnormal mammogram.

## 2020-08-04 ENCOUNTER — Other Ambulatory Visit: Payer: Self-pay | Admitting: Internal Medicine

## 2020-08-04 DIAGNOSIS — H1045 Other chronic allergic conjunctivitis: Secondary | ICD-10-CM | POA: Diagnosis not present

## 2020-08-04 DIAGNOSIS — J3089 Other allergic rhinitis: Secondary | ICD-10-CM | POA: Diagnosis not present

## 2020-08-04 DIAGNOSIS — J4530 Mild persistent asthma, uncomplicated: Secondary | ICD-10-CM | POA: Diagnosis not present

## 2020-08-04 MED ORDER — FLUCONAZOLE 100 MG PO TABS: 100.0000 mg | ORAL_TABLET | Freq: Every day | ORAL | 0 refills | Status: DC

## 2020-08-04 NOTE — Progress Notes (Signed)
rx sent in for diflucan

## 2020-08-09 DIAGNOSIS — J3089 Other allergic rhinitis: Secondary | ICD-10-CM | POA: Diagnosis not present

## 2020-08-11 DIAGNOSIS — R928 Other abnormal and inconclusive findings on diagnostic imaging of breast: Secondary | ICD-10-CM | POA: Diagnosis not present

## 2020-08-15 ENCOUNTER — Other Ambulatory Visit: Payer: Self-pay | Admitting: Internal Medicine

## 2020-08-18 DIAGNOSIS — J3089 Other allergic rhinitis: Secondary | ICD-10-CM | POA: Diagnosis not present

## 2020-08-22 ENCOUNTER — Other Ambulatory Visit: Payer: Self-pay | Admitting: General Surgery

## 2020-08-22 DIAGNOSIS — R928 Other abnormal and inconclusive findings on diagnostic imaging of breast: Secondary | ICD-10-CM

## 2020-08-25 DIAGNOSIS — J3089 Other allergic rhinitis: Secondary | ICD-10-CM | POA: Diagnosis not present

## 2020-08-26 ENCOUNTER — Ambulatory Visit
Admission: RE | Admit: 2020-08-26 | Discharge: 2020-08-26 | Disposition: A | Discharge status: Home / Self Care | Payer: BC Managed Care – PPO | Source: Ambulatory Visit | Attending: General Surgery | Admitting: General Surgery

## 2020-08-26 ENCOUNTER — Other Ambulatory Visit: Payer: Self-pay

## 2020-08-26 DIAGNOSIS — N6082 Other benign mammary dysplasias of left breast: Secondary | ICD-10-CM | POA: Diagnosis not present

## 2020-08-26 DIAGNOSIS — R928 Other abnormal and inconclusive findings on diagnostic imaging of breast: Secondary | ICD-10-CM

## 2020-08-26 DIAGNOSIS — N6322 Unspecified lump in the left breast, upper inner quadrant: Secondary | ICD-10-CM | POA: Diagnosis not present

## 2020-08-26 HISTORY — PX: BREAST BIOPSY: SHX20

## 2020-08-29 LAB — SURGICAL PATHOLOGY

## 2020-08-31 DIAGNOSIS — B379 Candidiasis, unspecified: Secondary | ICD-10-CM | POA: Diagnosis not present

## 2020-09-01 DIAGNOSIS — J3089 Other allergic rhinitis: Secondary | ICD-10-CM | POA: Diagnosis not present

## 2020-09-08 DIAGNOSIS — J3089 Other allergic rhinitis: Secondary | ICD-10-CM | POA: Diagnosis not present

## 2020-09-15 DIAGNOSIS — J3089 Other allergic rhinitis: Secondary | ICD-10-CM | POA: Diagnosis not present

## 2020-09-22 DIAGNOSIS — J3089 Other allergic rhinitis: Secondary | ICD-10-CM | POA: Diagnosis not present

## 2020-09-28 ENCOUNTER — Other Ambulatory Visit: Payer: Self-pay | Admitting: Internal Medicine

## 2020-09-29 DIAGNOSIS — J3089 Other allergic rhinitis: Secondary | ICD-10-CM | POA: Diagnosis not present

## 2020-10-07 DIAGNOSIS — J3089 Other allergic rhinitis: Secondary | ICD-10-CM | POA: Diagnosis not present

## 2020-10-13 DIAGNOSIS — J3089 Other allergic rhinitis: Secondary | ICD-10-CM | POA: Diagnosis not present

## 2020-10-27 DIAGNOSIS — J3089 Other allergic rhinitis: Secondary | ICD-10-CM | POA: Diagnosis not present

## 2020-11-03 ENCOUNTER — Other Ambulatory Visit: Payer: BC Managed Care – PPO

## 2020-11-03 DIAGNOSIS — J3089 Other allergic rhinitis: Secondary | ICD-10-CM | POA: Diagnosis not present

## 2020-11-07 ENCOUNTER — Ambulatory Visit: Payer: BC Managed Care – PPO | Admitting: Internal Medicine

## 2020-11-09 ENCOUNTER — Other Ambulatory Visit: Payer: Self-pay

## 2020-11-09 DIAGNOSIS — J40 Bronchitis, not specified as acute or chronic: Secondary | ICD-10-CM

## 2020-11-09 MED ORDER — ALBUTEROL SULFATE HFA 108 (90 BASE) MCG/ACT IN AERS: 2.0000 | INHALATION_SPRAY | Freq: Four times a day (QID) | RESPIRATORY_TRACT | 2 refills | Status: DC | PRN

## 2020-11-10 ENCOUNTER — Other Ambulatory Visit: Payer: Self-pay

## 2020-11-10 DIAGNOSIS — J3089 Other allergic rhinitis: Secondary | ICD-10-CM | POA: Diagnosis not present

## 2020-11-12 ENCOUNTER — Encounter: Payer: Self-pay | Admitting: Internal Medicine

## 2020-11-13 ENCOUNTER — Telehealth: Payer: BC Managed Care – PPO | Admitting: Orthopedic Surgery

## 2020-11-13 DIAGNOSIS — U071 COVID-19: Secondary | ICD-10-CM

## 2020-11-13 MED ORDER — PREDNISONE 20 MG PO TABS: 40.0000 mg | ORAL_TABLET | Freq: Every day | ORAL | 0 refills | Status: AC

## 2020-11-13 MED ORDER — MOLNUPIRAVIR EUA 200MG CAPSULE: 4.0000 | ORAL_CAPSULE | Freq: Two times a day (BID) | ORAL | 0 refills | Status: AC

## 2020-11-13 NOTE — Progress Notes (Signed)
Virtual Visit Consent   Donna Hensley, you are scheduled for a virtual visit with a [facility] provider today.     Just as with appointments in the office, your consent must be obtained to participate.  Your consent will be active for this visit and any virtual visit you may have with one of our providers in the next 365 days.     If you have a MyChart account, a copy of this consent can be sent to you electronically.  All virtual visits are billed to your insurance company just like a traditional visit in the office.    As this is a virtual visit, video technology does not allow for your provider to perform a traditional examination.  This may limit your provider's ability to fully assess your condition.  If your provider identifies any concerns that need to be evaluated in person or the need to arrange testing (such as labs, EKG, etc.), we will make arrangements to do so.     Although advances in technology are sophisticated, we cannot ensure that it will always work on either your end or our end.  If the connection with a video visit is poor, the visit may have to be switched to a telephone visit.  With either a video or telephone visit, we are not always able to ensure that we have a secure connection.     I need to obtain your verbal consent now.   Are you willing to proceed with your visit today? Yes   Donna Hensley has provided verbal consent on 11/13/2020 for a virtual visit (video or telephone).   Lisette Abu, PA-C   Date: 11/13/2020 12:51 PM   Virtual Visit via Video Note   I, Lisette Abu, connected with  Donna Hensley  ([id number], 18-Feb-1966) on 11/13/20 at 12:45 PM EDT by a video-enabled telemedicine application and verified that I am speaking with the correct person using two identifiers.  Location: Patient: Virtual Visit Location Patient: Home Provider: Virtual Visit Location Provider: Home Office   I discussed the limitations of evaluation and  management by telemedicine and the availability of in person appointments. The patient expressed understanding and agreed to proceed.    History of Present Illness: Donna Hensley is a 55 y.o. who identifies as a female who was assigned female at birth, and is being seen today for Covid. Her symptoms began yesterday and she tested positive last night. She has a cough and feels run down. Infections like these exacerbate her asthma and she often requires prednisone to treat that.  HPI: HPI  Problems:  Patient Active Problem List   Diagnosis Date Noted   Boil 02/29/2020   Eye irritation 06/13/2019   Conjunctivitis 03/15/2019   Left otitis media 06/23/2015   Essential hypertension 04/28/2015   Respiratory tract infection 04/18/2015   Health care maintenance 02/05/2015   Diabetes ([place]) 09/06/2013   Essential hypertension, benign 11/15/2012   Iron deficiency 10/05/2012   Mild intermittent asthma in adult without complication 10/21/7626   HYPERTHYROIDISM, SUBCLINICAL 08/24/2008   Allergic rhinitis 08/24/2008   GERD 08/24/2008    Allergies:  Allergies  Allergen Reactions   Doxycycline Nausea And Vomiting   Oxycodone Nausea And Vomiting   Seasonal Ic [Cholestatin]    Medications:  Current Outpatient Medications:    albuterol (VENTOLIN HFA) 108 (90 Base) MCG/ACT inhaler, Inhale 2 puffs into the lungs every 6 (six) hours as needed for wheezing., Disp: 6.7 g, Rfl: 2  azelastine (OPTIVAR) 0.05 % ophthalmic solution, Place 1 drop into both eyes daily., Disp: , Rfl:    BREO ELLIPTA 200-25 MCG/INH AEPB, Inhale 1 puff into the lungs daily. , Disp: , Rfl:    Calcium-Magnesium-Vitamin D (CALCIUM 500 PO), Take 1 tablet by mouth daily., Disp: , Rfl:    Cholecalciferol (VITAMIN D3) 1000 units CAPS, Take 1 capsule by mouth daily., Disp: , Rfl:    clotrimazole (MYCELEX) 10 MG troche, Take 1 tablet (10 mg total) by mouth 3 (three) times daily as needed., Disp: 21 Troche, Rfl: 0   Crisaborole  (EUCRISA) 2 % OINT, Use as directed., Disp: 60 g, Rfl: 0   Cyanocobalamin (VITAMIN B 12 PO), Take 1 tablet by mouth daily., Disp: , Rfl:    fluconazole (DIFLUCAN) 100 MG tablet, Take 1 tablet (100 mg total) by mouth daily., Disp: 7 tablet, Rfl: 0   fluticasone (FLONASE) 50 MCG/ACT nasal spray, USE 2 SPRAYS IN EACH NOSTRIL DAILY, Disp: 48 g, Rfl: 3   glucose blood test strip, Use as instructed, Disp: 200 each, Rfl: 3   ipratropium (ATROVENT) 0.03 % nasal spray, USE 2 SPRAYS IN EACH NOSTRIL EVERY 12 HOURS, Disp: 30 mL, Rfl: 1   levocetirizine (XYZAL) 5 MG tablet, Take 5 mg by mouth every evening., Disp: , Rfl: 3   losartan (COZAAR) 50 MG tablet, TAKE 1 TABLET DAILY, Disp: 90 tablet, Rfl: 3   metFORMIN (GLUCOPHAGE) 1000 MG tablet, TAKE 1 TABLET TWICE A DAY WITH A MEAL, Disp: 180 tablet, Rfl: 3   montelukast (SINGULAIR) 10 MG tablet, TAKE 1 TABLET DAILY AT BEDTIME, Disp: 90 tablet, Rfl: 3   Multiple Vitamin (MULTIVITAMIN) tablet, Take 1 tablet by mouth daily., Disp: , Rfl:    mupirocin nasal ointment (BACTROBAN NASAL) 2 %, Apply to affected area bid, Disp: 10 g, Rfl: 0   pantoprazole (PROTONIX) 40 MG tablet, TAKE 1 TABLET DAILY, Disp: 90 tablet, Rfl: 1   rosuvastatin (CRESTOR) 5 MG tablet, Take one tablet per week., Disp: 15 tablet, Rfl: 1   VITAMIN E PO, Take by mouth daily., Disp: , Rfl:   Observations/Objective: Patient is well-developed, well-nourished in no acute distress.  Resting comfortably  at home.  Head is normocephalic, atraumatic.  No labored breathing.  Speech is clear and coherent with logical content.  Patient is alert and oriented at baseline.    Assessment and Plan: 1. COVID -- Will prescribe molnupavir and a 5d burst of prednisone 40mg  daily to treat the asthma. Went over isolation protocols and when to seek further care.   Follow Up Instructions: I discussed the assessment and treatment plan with the patient. The patient was provided an opportunity to ask questions and  all were answered. The patient agreed with the plan and demonstrated an understanding of the instructions.  A copy of instructions were sent to the patient via MyChart.  The patient was advised to call back or seek an in-person evaluation if the symptoms worsen or if the condition fails to improve as anticipated.  Time:  I spent 15 minutes with the patient via telehealth technology discussing the above problems/concerns.    Lisette Abu, PA-C

## 2020-11-14 ENCOUNTER — Other Ambulatory Visit: Payer: Self-pay | Admitting: Internal Medicine

## 2020-11-14 NOTE — Telephone Encounter (Signed)
This is just an FYI for you. Called patient to check on her. She did a telehealth visit yesterday because she did not feel comfortable waiting through the weekend. Was prescribed a round of prednisone and oral antiviral medication. So far tolerating well. Symptoms are mild- body aches, sinus symptoms. Confirmed no fever. No sob, chest tightness, etc. Patient was advised to continue medications and let me know if she needs anything. She was also instructed to go to urgent care if develops any sob, chest tightness, etc. Patient is going to send my chart message with update later in the week. Advised patient that since she is currently being treated with prednisone and antiviral medications, to disregard my chart message that was sent regarding OTC medications, quarantine, etc. Pt agreed and will update later in the week.

## 2020-11-15 NOTE — Telephone Encounter (Signed)
On oral antiviral and prednisone.  Agree with note.  Agree with need to keep [place] posted.

## 2020-11-18 NOTE — Telephone Encounter (Signed)
If nasal congestion - flonase or nasacort nasal spray - 2 sprays each nostril one time per day. Do this in the evening.  Continue prednisone.  Mucinex will help with chest congestion.  Delsym - cough medication.  Continue inhalers.  If persistent symptoms or worsening symptoms - needs to be evaluated  I am happy to work her in for virtual visit next week, but I will not be here Monday. If any acute symptoms, needs earlier appt

## 2020-11-18 NOTE — Telephone Encounter (Signed)
Patient called she is about to finish taking the antiviral medication and she wants to know what medication she can take next for her symptoms. Symptoms are cough, congestion, body aches, headache. Coughing a lot at night.

## 2020-11-21 DIAGNOSIS — J3089 Other allergic rhinitis: Secondary | ICD-10-CM | POA: Diagnosis not present

## 2020-11-22 ENCOUNTER — Other Ambulatory Visit: Payer: Self-pay

## 2020-11-22 ENCOUNTER — Encounter: Payer: Self-pay | Admitting: Internal Medicine

## 2020-11-22 ENCOUNTER — Ambulatory Visit
Admission: RE | Admit: 2020-11-22 | Discharge: 2020-11-22 | Disposition: A | Discharge status: Home / Self Care | Payer: BC Managed Care – PPO | Source: Ambulatory Visit | Attending: Internal Medicine | Admitting: Internal Medicine

## 2020-11-22 ENCOUNTER — Ambulatory Visit
Admission: RE | Admit: 2020-11-22 | Discharge: 2020-11-22 | Disposition: A | Discharge status: Home / Self Care | Payer: BC Managed Care – PPO | Attending: Internal Medicine | Admitting: Internal Medicine

## 2020-11-22 ENCOUNTER — Telehealth (INDEPENDENT_AMBULATORY_CARE_PROVIDER_SITE_OTHER): Payer: BC Managed Care – PPO | Admitting: Internal Medicine

## 2020-11-22 VITALS — Ht 62.0 in | Wt 198.0 lb

## 2020-11-22 DIAGNOSIS — J40 Bronchitis, not specified as acute or chronic: Secondary | ICD-10-CM | POA: Insufficient documentation

## 2020-11-22 DIAGNOSIS — R059 Cough, unspecified: Secondary | ICD-10-CM | POA: Diagnosis not present

## 2020-11-22 DIAGNOSIS — B370 Candidal stomatitis: Secondary | ICD-10-CM

## 2020-11-22 DIAGNOSIS — J4521 Mild intermittent asthma with (acute) exacerbation: Secondary | ICD-10-CM | POA: Diagnosis not present

## 2020-11-22 DIAGNOSIS — U071 COVID-19: Secondary | ICD-10-CM | POA: Diagnosis not present

## 2020-11-22 MED ORDER — AZITHROMYCIN 250 MG PO TABS
ORAL_TABLET | ORAL | 0 refills | Status: AC
Start: 2020-11-22 — End: 2020-11-27

## 2020-11-22 MED ORDER — HYDROCOD POLST-CPM POLST ER 10-8 MG/5ML PO SUER
5.0000 mL | Freq: Every day | ORAL | 0 refills | Status: DC | PRN
Start: 2020-11-22 — End: 2020-12-13

## 2020-11-22 MED ORDER — PREDNISONE 20 MG PO TABS: 40.0000 mg | ORAL_TABLET | Freq: Every day | ORAL | 0 refills | Status: DC

## 2020-11-22 MED ORDER — NYSTATIN 100000 UNIT/ML MT SUSP
5.0000 mL | Freq: Four times a day (QID) | OROMUCOSAL | 0 refills | Status: DC
Start: 2020-11-22 — End: 2021-01-06

## 2020-11-22 NOTE — Progress Notes (Signed)
Tested positive for COVID a week ago on 7/15. Patient was treated with an antiviral medication(Molnupiravir) and prednisone.   Patient still having cough, sore throat, runny nose, headache, diarrhea off and on, and fatigue. Patient taking over the counter delsym and Mucinex.

## 2020-11-22 NOTE — Telephone Encounter (Signed)
Pt scheduled with Dr Olivia Mackie for a virtual visit

## 2020-11-23 ENCOUNTER — Encounter: Payer: Self-pay | Admitting: Internal Medicine

## 2020-11-23 NOTE — Progress Notes (Signed)
Virtual Visit via Video Note  I connected with Donna Hensley  on 11/22/20 at  9:30 AM EDT by a video enabled telemedicine application and verified that I am speaking with the correct person using two identifiers.  Location patient: home, [place] Location provider:work or home office Persons participating in the virtual visit: patient, provider  I discussed the limitations of evaluation and management by telemedicine and the availability of in person appointments. The patient expressed understanding and agreed to proceed.   HPI:  Acute telemedicine visit for : H/o asthma and + covid 11/11/20 had molnupuravir x 5 days bid has dry cough, tried delsym and muciniex dm last seen pulm Dr. Laurine Blazer in 2015 due to asthma controlled otherwise on albuterol and breo and xyzal she is coughing so much hoarse O2 sat 96-97% she has spirometer to use  Cxr negative today  -COVID-19 vaccine status:2/2 vaccines  ROS: See pertinent positives and negatives per HPI.  Past Medical History:  Diagnosis Date   Allergy    Asthma    GERD (gastroesophageal reflux disease)    Gestational diabetes    Hypertension     Past Surgical History:  Procedure Laterality Date   APPENDECTOMY  1986   BREAST BIOPSY Left 08/26/2020   Affirm bx-"RIBBON" clip path pending   [place]  2013   COLONOSCOPY WITH PROPOFOL N/A 05/05/2018   Procedure: COLONOSCOPY WITH PROPOFOL;  Surgeon: [name], MD;  Location: [facility] ENDOSCOPY;  Service: Endoscopy;  Laterality: N/A;   ESOPHAGOGASTRODUODENOSCOPY (EGD) WITH PROPOFOL N/A 05/05/2018   Procedure: ESOPHAGOGASTRODUODENOSCOPY (EGD) WITH PROPOFOL;  Surgeon: [name], MD;  Location: [facility] ENDOSCOPY;  Service: Endoscopy;  Laterality: N/A;   TONSILLECTOMY  1987   wisdom tooth removal       Current Outpatient Medications:    albuterol (VENTOLIN HFA) 108 (90 Base) MCG/ACT inhaler, Inhale 2 puffs into the lungs every 6 (six) hours as needed for  wheezing., Disp: 6.7 g, Rfl: 2   azithromycin (ZITHROMAX) 250 MG tablet, With foodTake 2 tablets on day 1, then 1 tablet daily on days 2 through 5, Disp: 6 tablet, Rfl: 0   BREO ELLIPTA 200-25 MCG/INH AEPB, Inhale 1 puff into the lungs daily. , Disp: , Rfl:    Calcium-Magnesium-Vitamin D (CALCIUM 500 PO), Take 1 tablet by mouth daily., Disp: , Rfl:    chlorpheniramine-HYDROcodone (TUSSIONEX PENNKINETIC ER) 10-8 MG/5ML SUER, Take 5 mLs by mouth daily as needed., Disp: 115 mL, Rfl: 0   Cholecalciferol (VITAMIN D3) 1000 units CAPS, Take 1 capsule by mouth daily., Disp: , Rfl:    Cyanocobalamin (VITAMIN B 12 PO), Take 1 tablet by mouth daily., Disp: , Rfl:    fluticasone (FLONASE) 50 MCG/ACT nasal spray, USE 2 SPRAYS IN EACH NOSTRIL DAILY, Disp: 48 g, Rfl: 3   glucose blood test strip, Use as instructed, Disp: 200 each, Rfl: 3   levocetirizine (XYZAL) 5 MG tablet, Take 5 mg by mouth every evening., Disp: , Rfl: 3   losartan (COZAAR) 50 MG tablet, TAKE 1 TABLET DAILY, Disp: 90 tablet, Rfl: 3   metFORMIN (GLUCOPHAGE) 1000 MG tablet, TAKE 1 TABLET TWICE A DAY WITH MEALS, Disp: 180 tablet, Rfl: 3   montelukast (SINGULAIR) 10 MG tablet, TAKE 1 TABLET DAILY AT BEDTIME, Disp: 90 tablet, Rfl: 3   Multiple Vitamin (MULTIVITAMIN) tablet, Take 1 tablet by mouth daily., Disp: , Rfl:    nystatin (MYCOSTATIN) 100000 UNIT/ML suspension, Take 5 mLs (500,000 Units total) by mouth  4 (four) times daily., Disp: 473 mL, Rfl: 0   pantoprazole (PROTONIX) 40 MG tablet, TAKE 1 TABLET DAILY, Disp: 90 tablet, Rfl: 1   predniSONE (DELTASONE) 20 MG tablet, Take 2 tablets (40 mg total) by mouth daily with breakfast. 5-10 days with food, Disp: 20 tablet, Rfl: 0   rosuvastatin (CRESTOR) 5 MG tablet, Take one tablet per week. (Patient taking differently: Take 5 mg by mouth every 30 (thirty) days.), Disp: 15 tablet, Rfl: 1   VITAMIN E PO, Take by mouth daily., Disp: , Rfl:    azelastine (OPTIVAR) 0.05 % ophthalmic solution, Place 1  drop into both eyes daily. (Patient not taking: Reported on 11/22/2020), Disp: , Rfl:    clotrimazole (MYCELEX) 10 MG troche, Take 1 tablet (10 mg total) by mouth 3 (three) times daily as needed., Disp: 21 Troche, Rfl: 0   Crisaborole (EUCRISA) 2 % OINT, Use as directed. (Patient not taking: Reported on 11/22/2020), Disp: 60 g, Rfl: 0   fluconazole (DIFLUCAN) 100 MG tablet, Take 1 tablet (100 mg total) by mouth daily. (Patient not taking: Reported on 11/22/2020), Disp: 7 tablet, Rfl: 0   ipratropium (ATROVENT) 0.03 % nasal spray, USE 2 SPRAYS IN EACH NOSTRIL EVERY 12 HOURS, Disp: 30 mL, Rfl: 1   mupirocin nasal ointment (BACTROBAN NASAL) 2 %, Apply to affected area bid, Disp: 10 g, Rfl: 0  EXAM:  VITALS per patient if applicable:  GENERAL: alert, oriented, appears well and in no acute distress  HEENT: atraumatic, conjunttiva clear, no obvious abnormalities on inspection of external nose and ears  NECK: normal movements of the head and neck  LUNGS: on inspection no signs of respiratory distress, breathing rate appears normal, no obvious gross SOB, gasping or wheezing +cough on exam   CV: no obvious cyanosis  MS: moves all visible extremities without noticeable abnormality  PSYCH/NEURO: pleasant and cooperative, no obvious depression or anxiety, speech and thought processing grossly intact  ASSESSMENT AND PLAN:  Discussed the following assessment and plan:  Bronchitis due to COVID-19 virus - Plan: DG Chest 2 View, chlorpheniramine-HYDROcodone (TUSSIONEX PENNKINETIC ER) 10-8 MG/5ML SUER, predniSONE (DELTASONE) 20 MG tablet, azithromycin (ZITHROMAX) 250 MG tablet Cont use inhalers albuterol and breo and spirometer  If not better let us know    Mild intermittent asthma with exacerbation - Plan: DG Chest 2 View, chlorpheniramine-HYDROcodone (TUSSIONEX PENNKINETIC ER) 10-8 MG/5ML SUER, predniSONE (DELTASONE) 20 MG tablet, azithromycin (ZITHROMAX) 250 MG tablet  Thrush - Plan: nystatin  (MYCOSTATIN) 100000 UNIT/ML suspension  If needing prescription strength medication we will need to make an appointment with a provider.  These are over the counter medication options:  Mucinex dm green label for cough.  Vitamin C 1000 mg daily.  Vitamin D3 4000 Iu (units) daily.  Zinc 100 mg daily.  Quercetin 250-500 mg 2 times per day   Elderberry  Oil of oregano  cepacol or chloroseptic spray  Warm tea with honey and lemon  Hydration  Try to eat though you dont feel like it   Tylenol or Advil  Nasal saline  Flonase     Monitor pulse oximeter, buy from [place] if oxygen is less than 90 please go to the hospital.        Are you feeling really sick? Shortness of breath, cough, chest pain?, dizziness? Confusion   If so let me know  If worsening, go to hospital or [facility] clinic Urgent care for further treatment.     -we discussed possible serious and likely etiologies, options  for evaluation and workup, limitations of telemedicine visit vs in person visit, treatment, treatment risks and precautions. Pt prefers to treat via telemedicine empirically rather than in person at this moment.    I discussed the assessment and treatment plan with the patient. The patient was provided an opportunity to ask questions and all were answered. The patient agreed with the plan and demonstrated an understanding of the instructions.    Time spent 30 min Delorise Jackson, MD

## 2020-12-02 DIAGNOSIS — J3089 Other allergic rhinitis: Secondary | ICD-10-CM | POA: Diagnosis not present

## 2020-12-08 DIAGNOSIS — J3089 Other allergic rhinitis: Secondary | ICD-10-CM | POA: Diagnosis not present

## 2020-12-13 ENCOUNTER — Ambulatory Visit
Admission: RE | Admit: 2020-12-13 | Discharge: 2020-12-13 | Disposition: A | Discharge status: Home / Self Care | Payer: BC Managed Care – PPO | Source: Ambulatory Visit | Attending: Family Medicine | Admitting: Family Medicine

## 2020-12-13 ENCOUNTER — Telehealth: Payer: Self-pay | Admitting: Internal Medicine

## 2020-12-13 ENCOUNTER — Other Ambulatory Visit: Payer: Self-pay

## 2020-12-13 VITALS — BP 159/100 | HR 82 | Temp 98.4°F | Resp 20

## 2020-12-13 DIAGNOSIS — Z8616 Personal history of COVID-19: Secondary | ICD-10-CM

## 2020-12-13 DIAGNOSIS — U099 Post covid-19 condition, unspecified: Secondary | ICD-10-CM | POA: Diagnosis not present

## 2020-12-13 DIAGNOSIS — R053 Chronic cough: Secondary | ICD-10-CM

## 2020-12-13 MED ORDER — BENZONATATE 200 MG PO CAPS: 200.0000 mg | ORAL_CAPSULE | Freq: Three times a day (TID) | ORAL | 0 refills | Status: DC | PRN

## 2020-12-13 MED ORDER — PROMETHAZINE-DM 6.25-15 MG/5ML PO SYRP: 5.0000 mL | ORAL_SOLUTION | Freq: Four times a day (QID) | ORAL | 0 refills | Status: DC | PRN

## 2020-12-13 NOTE — ED Triage Notes (Addendum)
Pt presents today with c/o of cough. She reports being dx with Covid 4 weeks ago and continues to have cough (dry). She was unable to get into PCP. Also reports thrush from recent Prednisone taper.

## 2020-12-13 NOTE — Telephone Encounter (Signed)
Patient informed, Due to the high volume of calls and your symptoms we have to forward your call to our Triage Nurse to expedient your call. Please hold for the transfer.  Patient transferred to Access Nurse. Due to having COVID 4 weeks ago and still having a cough and lung irritation.She has been on prednisone the past 15 days and is unsure what to do next.No openings in office or virtual.

## 2020-12-13 NOTE — Telephone Encounter (Signed)
Patient has not seen pulmonology since Dr. Tami Ribas was in our office, patient says that she has not this issue for long while since she has been taking allergy shots but patient is willing to see pulmonology if PCP feels she needs to. Advised patient any worsening of symptoms to be seen immediately at [facility] again or ER.

## 2020-12-13 NOTE — Discharge Instructions (Addendum)
Medication as prescribed.  Best of luck  Dr. Lacinda Axon

## 2020-12-13 NOTE — Telephone Encounter (Signed)
Reason for Triage Call /in person: Covid X 4 weeks ago still has cough and hoarse.   Symptoms: Mild Cough and hoarse  with chest tightness, cough is dry and non- productive.   Pain Scale:none, has had no pain just chest tightness inhaler helps.   Vitals: BP ________ , Pulse __________,  02 Sats  ____96% on room air____, Respirations _________, Temp ___________,   Onset: Tested positive for COVID 11/11/20  Duration: 4 weeks  Medications: used but not effective completely but has helped Prescribed Molnupiravir on 10/28/20 with Prednisone, seen on 11/22/20 symptoms not improved given second course of prednisone, Azithromycin, Nystatin for thrush and Tussin ex cough medication.   Last seen for this problem: 11/22/20 [facility]   Outcome: Patient asking next steps should she be getting better or repeat prednisone?  Attach Access Nurse Triage Note.

## 2020-12-13 NOTE — Telephone Encounter (Signed)
If having persistent chest tightness and symptoms, feel needs to be reevaluated.  She may need further testing PFTs, pulmonary, f/u cxr, etc.  Can see about work in appt if ok to wait.

## 2020-12-14 NOTE — Telephone Encounter (Signed)
Per review, she was seen yesterday at Urgent Care.  Prescribed tessalon perles.  I have placed the order for pulmonary for further evaluation post covid and persistent cough.  May need f/u PFTs, etc.  If problems before pulmonary appt, let us know.

## 2020-12-15 NOTE — ED Provider Notes (Signed)
[facility]    CSN: [id number] Arrival date & time: 12/13/20  1252      History   Chief Complaint Chief Complaint  Patient presents with   Cough   HPI  55 year old female presents with the above complaint.  Had COVID approximately 4 weeks ago.  She has been treated with antiviral, prednisone, and azithromycin.  She states that she continues to have cough.  Cough is dry.  She is bothered by the cough which continues to be persistent.  No shortness of breath.  No fever.  No other associated symptoms.  No other complaints.  Past Medical History:  Diagnosis Date   Allergy    Asthma    GERD (gastroesophageal reflux disease)    Gestational diabetes    Hypertension     Patient Active Problem List   Diagnosis Date Noted   Boil 02/29/2020   Eye irritation 06/13/2019   Conjunctivitis 03/15/2019   Left otitis media 06/23/2015   Essential hypertension 04/28/2015   Respiratory tract infection 04/18/2015   Health care maintenance 02/05/2015   Diabetes ([place]) 09/06/2013   Essential hypertension, benign 11/15/2012   Iron deficiency 10/05/2012   Mild intermittent asthma in adult without complication [id number]   HYPERTHYROIDISM, SUBCLINICAL 08/24/2008   Allergic rhinitis 08/24/2008   GERD 08/24/2008    Past Surgical History:  Procedure Laterality Date   APPENDECTOMY  1986   BREAST BIOPSY Left 08/26/2020   Affirm bx-"RIBBON" clip path pending   [place]  2013   COLONOSCOPY WITH PROPOFOL N/A 05/05/2018   Procedure: COLONOSCOPY WITH PROPOFOL;  Surgeon: [name], MD;  Location: [facility] ENDOSCOPY;  Service: Endoscopy;  Laterality: N/A;   ESOPHAGOGASTRODUODENOSCOPY (EGD) WITH PROPOFOL N/A 05/05/2018   Procedure: ESOPHAGOGASTRODUODENOSCOPY (EGD) WITH PROPOFOL;  Surgeon: [name], MD;  Location: [facility] ENDOSCOPY;  Service: Endoscopy;  Laterality: N/A;   TONSILLECTOMY  1987   wisdom tooth removal      OB History   No  obstetric history on file.      Home Medications    Prior to Admission medications   Medication Sig Start Date End Date Taking? Authorizing Provider  benzonatate (TESSALON) 200 MG capsule Take 1 capsule (200 mg total) by mouth 3 (three) times daily as needed for cough. 12/13/20  Yes Nickson Middlesworth G, DO  promethazine-dextromethorphan (PROMETHAZINE-DM) 6.25-15 MG/5ML syrup Take 5 mLs by mouth 4 (four) times daily as needed for cough. 12/13/20  Yes Shadeed Colberg G, DO  albuterol (VENTOLIN HFA) 108 (90 Base) MCG/ACT inhaler Inhale 2 puffs into the lungs every 6 (six) hours as needed for wheezing. 11/09/20   Einar Pheasant, MD  BREO ELLIPTA 200-25 MCG/INH AEPB Inhale 1 puff into the lungs daily.  12/29/15   [provider]  Calcium-Magnesium-Vitamin D (CALCIUM 500 PO) Take 1 tablet by mouth daily.    [provider]  Cholecalciferol (VITAMIN D3) 1000 units CAPS Take 1 capsule by mouth daily.    [provider]  clotrimazole (MYCELEX) 10 MG troche Take 1 tablet (10 mg total) by mouth 3 (three) times daily as needed. 06/23/20   Einar Pheasant, MD  Crisaborole (EUCRISA) 2 % OINT Use as directed. Patient not taking: Reported on 11/22/2020 10/22/19   Einar Pheasant, MD  Cyanocobalamin (VITAMIN B 12 PO) Take 1 tablet by mouth daily.    [provider]  fluticasone (FLONASE) 50 MCG/ACT nasal spray USE 2 SPRAYS IN [facility] NOSTRIL DAILY 10/18/16   Nicki Reaper,  Charlene, MD  glucose blood test strip Use as instructed 12/19/16   Einar Pheasant, MD  ipratropium (ATROVENT) 0.03 % nasal spray USE 2 SPRAYS IN EACH NOSTRIL EVERY 12 HOURS 06/29/20   Einar Pheasant, MD  levocetirizine (XYZAL) 5 MG tablet Take 5 mg by mouth every evening. 02/08/16   [provider]  losartan (COZAAR) 50 MG tablet TAKE 1 TABLET DAILY 03/14/20   Einar Pheasant, MD  metFORMIN (GLUCOPHAGE) 1000 MG tablet TAKE 1 TABLET TWICE A DAY WITH MEALS 11/14/20   Einar Pheasant, MD  montelukast (SINGULAIR) 10 MG tablet  TAKE 1 TABLET DAILY AT BEDTIME 08/15/20   Einar Pheasant, MD  Multiple Vitamin (MULTIVITAMIN) tablet Take 1 tablet by mouth daily.    [provider]  mupirocin nasal ointment (BACTROBAN NASAL) 2 % Apply to affected area bid 02/25/20   Einar Pheasant, MD  nystatin (MYCOSTATIN) 100000 UNIT/ML suspension Take 5 mLs (500,000 Units total) by mouth 4 (four) times daily. 11/22/20   McLean-Scocuzza, Nino Glow, MD  pantoprazole (PROTONIX) 40 MG tablet TAKE 1 TABLET DAILY 09/28/20   Einar Pheasant, MD  rosuvastatin (CRESTOR) 5 MG tablet Take one tablet per week. Patient taking differently: Take 5 mg by mouth every 30 (thirty) days. 02/25/20   Einar Pheasant, MD  VITAMIN E PO Take by mouth daily.    [provider]    Family History Family History  Problem Relation Age of Onset   Hyperlipidemia Mother    Osteoporosis Mother    Hypertension Father    Arthritis Father    Sleep apnea Father    Heart disease Father    Sleep apnea Brother    Arthritis Paternal Grandmother    Diabetes Paternal Grandmother    Breast cancer Neg Hx     Social History Social History   Tobacco Use   Smoking status: Former    Packs/day: 0.25    Years: 5.00    Pack years: 1.25    Types: Cigarettes    Quit date: 09/28/1988    Years since quitting: 32.2   Smokeless tobacco: Never   Tobacco comments:    smoked briefly as a teenager  Vaping Use   Vaping Use: Never used  Substance Use Topics   Alcohol use: Yes    Alcohol/week: 1.0 standard drink    Types: 1 Glasses of wine per week    Comment: Occasional 1-2 per month   Drug use: Never     Allergies   Doxycycline, Oxycodone, and Seasonal ic [cholestatin]   Review of Systems Review of Systems Per HPI  Physical Exam Triage Vital Signs ED Triage Vitals  Enc Vitals Group     BP 12/13/20 1305 (!) 159/100     Pulse Rate 12/13/20 1305 82     Resp 12/13/20 1305 20     Temp 12/13/20 1305 98.4 F (36.9 C)     Temp Source 12/13/20 1305 Oral      SpO2 12/13/20 1305 100 %     Weight --      Height --      Head Circumference --      Peak Flow --      Pain Score 12/13/20 1302 0     Pain Loc --      Pain Edu? --      Excl. in [place]? --    Updated Vital Signs BP (!) 159/100 (BP Location: Left Arm)   Pulse 82   Temp 98.4 F (36.9 C) (Oral)   Resp  20   LMP 07/17/2017   SpO2 100%   Visual Acuity Right Eye Distance:   Left Eye Distance:   Bilateral Distance:    Right Eye Near:   Left Eye Near:    Bilateral Near:     Physical Exam Vitals and nursing note reviewed.  Constitutional:      General: She is not in acute distress.    Appearance: Normal appearance. She is not ill-appearing.  HENT:     Head: Normocephalic and atraumatic.  Eyes:     General:        Right eye: No discharge.        Left eye: No discharge.     Conjunctiva/sclera: Conjunctivae normal.  Cardiovascular:     Rate and Rhythm: Normal rate and regular rhythm.  Pulmonary:     Effort: Pulmonary effort is normal.     Breath sounds: Normal breath sounds. No wheezing, rhonchi or rales.  Neurological:     Mental Status: She is alert.    UC Treatments / Results  Labs (all labs ordered are listed, but only abnormal results are displayed) Labs Reviewed - No data to display  EKG   Radiology No results found.  Procedures Procedures (including critical care time)  Medications Ordered in UC Medications - No data to display  Initial Impression / Assessment and Plan / UC Course  I have reviewed the triage vital signs and the nursing notes.  Pertinent labs & imaging results that were available during my care of the patient were reviewed by me and considered in my medical decision making (see chart for details).    55 year old female presents with post covid chronic cough. Treating with Tessalon Perles and Promethazine DM.  Final Clinical Impressions(s) / UC Diagnoses   Final diagnoses:  Post-COVID chronic cough     Discharge Instructions       Medication as prescribed.  Best of luck  Dr. Lacinda Axon    ED Prescriptions     Medication Sig Dispense Auth. Provider   benzonatate (TESSALON) 200 MG capsule Take 1 capsule (200 mg total) by mouth 3 (three) times daily as needed for cough. 30 capsule Kelvin Burpee G, DO   promethazine-dextromethorphan (PROMETHAZINE-DM) 6.25-15 MG/5ML syrup Take 5 mLs by mouth 4 (four) times daily as needed for cough. 118 mL Coral Spikes, DO      PDMP not reviewed this encounter.   Coral Spikes, DO 12/15/20 0800

## 2020-12-16 DIAGNOSIS — J3089 Other allergic rhinitis: Secondary | ICD-10-CM | POA: Diagnosis not present

## 2020-12-16 NOTE — Telephone Encounter (Signed)
I wanted call and speak with patient before the weekend she states she is feeling some better her 02 sats this morning are 94% on room air.  Advised her if 02 is worsening or drops to below 90% during the weekend she needs to be evaluated. Patient is aware of referral to pulmonary.

## 2020-12-20 DIAGNOSIS — J3089 Other allergic rhinitis: Secondary | ICD-10-CM | POA: Diagnosis not present

## 2020-12-29 DIAGNOSIS — J301 Allergic rhinitis due to pollen: Secondary | ICD-10-CM | POA: Diagnosis not present

## 2021-01-03 ENCOUNTER — Other Ambulatory Visit (INDEPENDENT_AMBULATORY_CARE_PROVIDER_SITE_OTHER): Payer: BC Managed Care – PPO

## 2021-01-03 ENCOUNTER — Other Ambulatory Visit: Payer: Self-pay

## 2021-01-03 DIAGNOSIS — Z114 Encounter for screening for human immunodeficiency virus [HIV]: Secondary | ICD-10-CM

## 2021-01-03 DIAGNOSIS — Z1159 Encounter for screening for other viral diseases: Secondary | ICD-10-CM | POA: Diagnosis not present

## 2021-01-03 DIAGNOSIS — E1165 Type 2 diabetes mellitus with hyperglycemia: Secondary | ICD-10-CM | POA: Diagnosis not present

## 2021-01-03 DIAGNOSIS — I10 Essential (primary) hypertension: Secondary | ICD-10-CM | POA: Diagnosis not present

## 2021-01-03 LAB — BASIC METABOLIC PANEL
BUN: 12 mg/dL (ref 6–23)
CO2: 24 mEq/L (ref 19–32)
Calcium: 9.8 mg/dL (ref 8.4–10.5)
Chloride: 102 mEq/L (ref 96–112)
Creatinine, Ser: 0.86 mg/dL (ref 0.40–1.20)
GFR: 77.5 mL/min (ref >60.00)
Glucose, Bld: 123 mg/dL — ABNORMAL HIGH (ref 70–99)
Potassium: 3.6 mEq/L (ref 3.5–5.1)
Sodium: 138 mEq/L (ref 135–145)

## 2021-01-03 LAB — HEMOGLOBIN A1C: Hgb A1c MFr Bld: 6.9 % — ABNORMAL HIGH (ref 4.6–6.5)

## 2021-01-03 LAB — LIPID PANEL
Cholesterol: 137 mg/dL (ref 0–200)
HDL: 56.6 mg/dL (ref >39.00)
LDL Cholesterol: 68 mg/dL (ref 0–99)
NonHDL: 80.56
Total CHOL/HDL Ratio: 2
Triglycerides: 62 mg/dL (ref 0.0–149.0)
VLDL: 12.4 mg/dL (ref 0.0–40.0)

## 2021-01-03 LAB — HEPATIC FUNCTION PANEL
ALT: 37 U/L — ABNORMAL HIGH (ref 0–35)
AST: 33 U/L (ref 0–37)
Albumin: 4.2 g/dL (ref 3.5–5.2)
Alkaline Phosphatase: 88 U/L (ref 39–117)
Bilirubin, Direct: 0.4 mg/dL — ABNORMAL HIGH (ref 0.0–0.3)
Total Bilirubin: 1.1 mg/dL (ref 0.2–1.2)
Total Protein: 7.4 g/dL (ref 6.0–8.3)

## 2021-01-05 DIAGNOSIS — J3089 Other allergic rhinitis: Secondary | ICD-10-CM | POA: Diagnosis not present

## 2021-01-06 ENCOUNTER — Ambulatory Visit: Payer: BC Managed Care – PPO | Admitting: Internal Medicine

## 2021-01-06 ENCOUNTER — Other Ambulatory Visit: Payer: Self-pay

## 2021-01-06 ENCOUNTER — Encounter: Payer: Self-pay | Admitting: Internal Medicine

## 2021-01-06 VITALS — BP 132/80 | HR 86 | Temp 98.0°F | Resp 16 | Ht 62.0 in | Wt 196.0 lb

## 2021-01-06 DIAGNOSIS — E611 Iron deficiency: Secondary | ICD-10-CM

## 2021-01-06 DIAGNOSIS — I10 Essential (primary) hypertension: Secondary | ICD-10-CM

## 2021-01-06 DIAGNOSIS — E1165 Type 2 diabetes mellitus with hyperglycemia: Secondary | ICD-10-CM

## 2021-01-06 DIAGNOSIS — Z13220 Encounter for screening for lipoid disorders: Secondary | ICD-10-CM | POA: Diagnosis not present

## 2021-01-06 DIAGNOSIS — J4520 Mild intermittent asthma, uncomplicated: Secondary | ICD-10-CM

## 2021-01-06 DIAGNOSIS — R928 Other abnormal and inconclusive findings on diagnostic imaging of breast: Secondary | ICD-10-CM

## 2021-01-06 DIAGNOSIS — K219 Gastro-esophageal reflux disease without esophagitis: Secondary | ICD-10-CM

## 2021-01-06 DIAGNOSIS — Z23 Encounter for immunization: Secondary | ICD-10-CM | POA: Diagnosis not present

## 2021-01-06 DIAGNOSIS — R7989 Other specified abnormal findings of blood chemistry: Secondary | ICD-10-CM

## 2021-01-06 DIAGNOSIS — B1920 Unspecified viral hepatitis C without hepatic coma: Secondary | ICD-10-CM

## 2021-01-06 DIAGNOSIS — R945 Abnormal results of liver function studies: Secondary | ICD-10-CM | POA: Diagnosis not present

## 2021-01-06 NOTE — Progress Notes (Addendum)
Patient ID: Donna Hensley, female   DOB: 23-Jun-1965, 55 y.o.   MRN: [id number]   Subjective:    Patient ID: Donna Hensley, female    DOB: 1965-12-20, 55 y.o.   MRN: [id number]  This visit occurred during the SARS-CoV-2 public health emergency.  Safety protocols were in place, including screening questions prior to the visit, additional usage of staff PPE, and extensive cleaning of exam room while observing appropriate contact time as indicated for disinfecting solutions.   Patient here for a scheduled follow up.  Chief Complaint  Patient presents with   Diabetes   Hypertension   Gastroesophageal Reflux   .   HPI She was recently diagnosed with covid.  Covid positive 11/11/20.  Treated with molnupiravir.  History of asthma.  Uses breo.  Has rescue inhaler.  See UC 12/13/20 - note reviewed - persistent cough.  She is feeling better now.  Planning to follow with pulmonary.  Discussed f/u evaluation and question of need for PFTs, etc.  Has not needed albuterol inhaler for the last 1-2 weeks.  No chest pain.  Eating.  No nausea or vomiting reported.  Blood sugars averaging 110-125 in the am.  No abdominal pain.  Bowels moving.  Not sleeping well.  Discussed possible sleep apnea.    Past Medical History:  Diagnosis Date   Allergy    Asthma    GERD (gastroesophageal reflux disease)    Gestational diabetes    Hypertension    Past Surgical History:  Procedure Laterality Date   APPENDECTOMY  1986   BREAST BIOPSY Left 08/26/2020   Affirm bx-"RIBBON" clip path pending   [place]  2013   COLONOSCOPY WITH PROPOFOL N/A 05/05/2018   Procedure: COLONOSCOPY WITH PROPOFOL;  Surgeon: [name], MD;  Location: [facility] ENDOSCOPY;  Service: Endoscopy;  Laterality: N/A;   ESOPHAGOGASTRODUODENOSCOPY (EGD) WITH PROPOFOL N/A 05/05/2018   Procedure: ESOPHAGOGASTRODUODENOSCOPY (EGD) WITH PROPOFOL;  Surgeon: [name], MD;  Location: [facility] ENDOSCOPY;   Service: Endoscopy;  Laterality: N/A;   TONSILLECTOMY  1987   wisdom tooth removal     Family History  Problem Relation Age of Onset   Hyperlipidemia Mother    Osteoporosis Mother    Hypertension Father    Arthritis Father    Sleep apnea Father    Heart disease Father    Sleep apnea Brother    Arthritis Paternal Grandmother    Diabetes Paternal Grandmother    Breast cancer Neg Hx    Social History   Socioeconomic History   Marital status: Married    Spouse name: Not on file   Number of children: 2   Years of education: Not on file   Highest education level: Not on file  Occupational History   Occupation: csr    Employer: [employer]  Tobacco Use   Smoking status: Former    Packs/day: 0.25    Years: 5.00    Pack years: 1.25    Types: Cigarettes    Quit date: 09/28/1988    Years since quitting: 32.2   Smokeless tobacco: Never   Tobacco comments:    smoked briefly as a teenager  Vaping Use   Vaping Use: Never used  Substance and Sexual Activity   Alcohol use: Yes    Alcohol/week: 1.0 standard drink    Types: 1 Glasses of wine per week    Comment: Occasional 1-2 per month   Drug use: Never  Sexual activity: Not on file  Other Topics Concern   Not on file  Social History Narrative   Regular exercise-yes, walk   Diet: fast food, trying to improve diet         Social Determinants of Health   Financial Resource Strain: Not on file  Food Insecurity: Not on file  Transportation Needs: Not on file  Physical Activity: Not on file  Stress: Not on file  Social Connections: Not on file    Review of Systems  Constitutional:  Negative for appetite change and unexpected weight change.  HENT:  Negative for sinus pressure.   Respiratory:  Negative for cough and chest tightness.        Breathing overall stable.  Cough better.   Cardiovascular:  Negative for chest pain, palpitations and leg swelling.  Gastrointestinal:  Negative for abdominal pain,  diarrhea, nausea and vomiting.  Genitourinary:  Negative for difficulty urinating and dysuria.  Musculoskeletal:  Negative for joint swelling and myalgias.  Skin:  Negative for color change and rash.  Neurological:  Negative for dizziness, light-headedness and headaches.  Psychiatric/Behavioral:  Positive for sleep disturbance. Negative for agitation and dysphoric mood.       Objective:     BP 132/80   Pulse 86   Temp 98 F (36.7 C)   Resp 16   Ht 5' 2" (1.575 m)   Wt 196 lb (88.9 kg)   LMP 07/17/2017   SpO2 98%   BMI 35.85 kg/m  Wt Readings from Last 3 Encounters:  01/06/21 196 lb (88.9 kg)  11/22/20 198 lb (89.8 kg)  06/29/20 202 lb (91.6 kg)    Physical Exam Vitals reviewed.  Constitutional:      General: She is not in acute distress.    Appearance: Normal appearance.  HENT:     Head: Normocephalic and atraumatic.     Right Ear: External ear normal.     Left Ear: External ear normal.  Eyes:     General: No scleral icterus.       Right eye: No discharge.        Left eye: No discharge.     Conjunctiva/sclera: Conjunctivae normal.  Neck:     Thyroid: No thyromegaly.  Cardiovascular:     Rate and Rhythm: Normal rate and regular rhythm.  Pulmonary:     Effort: No respiratory distress.     Breath sounds: Normal breath sounds. No wheezing.  Abdominal:     General: Bowel sounds are normal.     Palpations: Abdomen is soft.     Tenderness: There is no abdominal tenderness.  Musculoskeletal:        General: No swelling or tenderness.     Cervical back: Neck supple. No tenderness.  Lymphadenopathy:     Cervical: No cervical adenopathy.  Skin:    Findings: No erythema or rash.  Neurological:     Mental Status: She is alert.  Psychiatric:        Mood and Affect: Mood normal.        Behavior: Behavior normal.     Outpatient Encounter Medications as of 01/06/2021  Medication Sig   albuterol (VENTOLIN HFA) 108 (90 Base) MCG/ACT inhaler Inhale 2 puffs into the  lungs every 6 (six) hours as needed for wheezing.   BREO ELLIPTA 200-25 MCG/INH AEPB Inhale 1 puff into the lungs daily.    Calcium-Magnesium-Vitamin D (CALCIUM 500 PO) Take 1 tablet by mouth daily.   Cholecalciferol (VITAMIN D3) 1000 units CAPS  Take 1 capsule by mouth daily.   Cyanocobalamin (VITAMIN B 12 PO) Take 1 tablet by mouth daily.   fluticasone (FLONASE) 50 MCG/ACT nasal spray USE 2 SPRAYS IN EACH NOSTRIL DAILY   glucose blood test strip Use as instructed   levocetirizine (XYZAL) 5 MG tablet Take 5 mg by mouth every evening.   losartan (COZAAR) 50 MG tablet TAKE 1 TABLET DAILY   metFORMIN (GLUCOPHAGE) 1000 MG tablet TAKE 1 TABLET TWICE A DAY WITH MEALS   montelukast (SINGULAIR) 10 MG tablet TAKE 1 TABLET DAILY AT BEDTIME   Multiple Vitamin (MULTIVITAMIN) tablet Take 1 tablet by mouth daily.   pantoprazole (PROTONIX) 40 MG tablet TAKE 1 TABLET DAILY   rosuvastatin (CRESTOR) 5 MG tablet Take one tablet per week. (Patient taking differently: Take 5 mg by mouth every 30 (thirty) days.)   VITAMIN E PO Take by mouth daily.   [DISCONTINUED] benzonatate (TESSALON) 200 MG capsule Take 1 capsule (200 mg total) by mouth 3 (three) times daily as needed for cough.   [DISCONTINUED] clotrimazole (MYCELEX) 10 MG troche Take 1 tablet (10 mg total) by mouth 3 (three) times daily as needed.   [DISCONTINUED] Crisaborole (EUCRISA) 2 % OINT Use as directed. (Patient not taking: Reported on 11/22/2020)   [DISCONTINUED] ipratropium (ATROVENT) 0.03 % nasal spray USE 2 SPRAYS IN EACH NOSTRIL EVERY 12 HOURS   [DISCONTINUED] mupirocin nasal ointment (BACTROBAN NASAL) 2 % Apply to affected area bid   [DISCONTINUED] nystatin (MYCOSTATIN) 100000 UNIT/ML suspension Take 5 mLs (500,000 Units total) by mouth 4 (four) times daily.   [DISCONTINUED] promethazine-dextromethorphan (PROMETHAZINE-DM) 6.25-15 MG/5ML syrup Take 5 mLs by mouth 4 (four) times daily as needed for cough.   No facility-administered encounter  medications on file as of 01/06/2021.     Lab Results  Component Value Date   WBC 9.1 06/27/2020   HGB 14.6 06/27/2020   HCT 43.2 06/27/2020   PLT 315.0 06/27/2020   GLUCOSE 123 (H) 01/03/2021   CHOL 137 01/03/2021   TRIG 62.0 01/03/2021   HDL 56.60 01/03/2021   LDLCALC 68 01/03/2021   ALT 37 (H) 01/03/2021   AST 33 01/03/2021   NA 138 01/03/2021   K 3.6 01/03/2021   CL 102 01/03/2021   CREATININE 0.86 01/03/2021   BUN 12 01/03/2021   CO2 24 01/03/2021   TSH 1.07 02/23/2020   HGBA1C 6.9 (H) 01/03/2021   MICROALBUR 2.0 (H) 02/23/2020       Assessment & Plan:   Problem List Items Addressed This Visit     Abnormal mammogram    Recent abnormal mammogram.  S/p biopsy.  Benign.  Recommended f/u mammogram in March 2023.        Diabetes ([place])    Low carb diet and exercise.  Follow met b and a1c.  Sugars as outlined        Relevant Orders   Hemoglobin A1c   Essential hypertension - Primary    Blood pressure doing well.  Continue losartan.  Follow pressures.  Follow metabolic panel.       Relevant Orders   CBC with Differential/Platelet   Basic metabolic panel   TSH   GERD    No upper symptoms reported.  On protonix.       Hepatitis C test positive    Discussed recent labs.  Hepatitis C antibody positive.  Awaiting RNA testing.  Further evaluation/treatment pending results.        Iron deficiency    History of low iron.  Check cbc and ferritin with next labs.       Relevant Orders   IBC + Ferritin   Mild intermittent asthma in adult without complication    Breathing stable.  Continue breo.  Has rescue inhaler if needed.  Recent covid with prolonged - cough, etc.  Better now.  F/u with pulmonary for reevaluation and question of need for f/u PFTs, etc.        Other Visit Diagnoses     Screening cholesterol level       Relevant Orders   Lipid panel   Abnormal liver function test       Relevant Orders   Hepatic function panel   Need for immunization  against influenza       Relevant Orders   Flu Vaccine QUAD 70moIM (Fluarix, Fluzone & Alfiuria Quad PF) (Completed)        CEinar Pheasant MD

## 2021-01-07 ENCOUNTER — Telehealth: Payer: Self-pay | Admitting: Internal Medicine

## 2021-01-07 ENCOUNTER — Encounter: Payer: Self-pay | Admitting: Internal Medicine

## 2021-01-07 DIAGNOSIS — B1920 Unspecified viral hepatitis C without hepatic coma: Secondary | ICD-10-CM | POA: Insufficient documentation

## 2021-01-07 DIAGNOSIS — R928 Other abnormal and inconclusive findings on diagnostic imaging of breast: Secondary | ICD-10-CM | POA: Insufficient documentation

## 2021-01-07 LAB — HEPATITIS C ANTIBODY
Hepatitis C Ab: REACTIVE — AB
SIGNAL TO CUT-OFF: 29.1 — ABNORMAL HIGH (ref <1.00)

## 2021-01-07 LAB — HIV ANTIBODY (ROUTINE TESTING W REFLEX): HIV 1&2 Ab, 4th Generation: NONREACTIVE

## 2021-01-07 LAB — HEPATITIS C RNA QUANTITATIVE
HCV Quantitative Log: 5.74 Log IU/mL — ABNORMAL HIGH
HCV RNA, PCR, QN: 544000 IU/mL — ABNORMAL HIGH

## 2021-01-07 NOTE — Telephone Encounter (Signed)
Needs appt with pulmonary.  Referral has been placed.  Can forward to [place] if needed

## 2021-01-07 NOTE — Assessment & Plan Note (Signed)
Recent abnormal mammogram.  S/p biopsy.  Benign.  Recommended f/u mammogram in March 2023.

## 2021-01-07 NOTE — Assessment & Plan Note (Signed)
Discussed recent labs.  Hepatitis C antibody positive.  Awaiting RNA testing.  Further evaluation/treatment pending results.

## 2021-01-07 NOTE — Assessment & Plan Note (Signed)
Breathing stable.  Continue breo.  Has rescue inhaler if needed.  Recent covid with prolonged - cough, etc.  Better now.  F/u with pulmonary for reevaluation and question of need for f/u PFTs, etc.

## 2021-01-07 NOTE — Addendum Note (Signed)
Addended by: Alisa Graff on: 01/07/2021 08:06 PM   Modules accepted: Orders

## 2021-01-07 NOTE — Assessment & Plan Note (Signed)
Blood pressure doing well.  Continue losartan.  Follow pressures.  Follow metabolic panel.

## 2021-01-07 NOTE — Assessment & Plan Note (Signed)
History of low iron.  Check cbc and ferritin with next labs.

## 2021-01-07 NOTE — Assessment & Plan Note (Signed)
Low carb diet and exercise.  Follow met b and a1c.  Sugars as outlined.   

## 2021-01-07 NOTE — Assessment & Plan Note (Signed)
No upper symptoms reported.  On protonix.   

## 2021-01-09 ENCOUNTER — Telehealth: Payer: Self-pay | Admitting: *Deleted

## 2021-01-09 NOTE — Telephone Encounter (Signed)
-----   Message from Einar Pheasant, MD sent at 01/09/2021  3:54 PM EDT ----- I had spoke to [place] about her initial hepatitis c test being positive and that we were waiting for the RNA test.  The RNA test is positive as well.  I would like to refer her to infectious disease for further treatment and evaluation.  If agreeable,let me know and I will place order for the referral.  I can talk with her if I need to (if she has questions)

## 2021-01-09 NOTE — Telephone Encounter (Signed)
Tried to reach patient by phone could not leave voicemail, recording stated call cannot be completed at this time.

## 2021-01-10 NOTE — Telephone Encounter (Signed)
Patient calling back in and agreeable to referral

## 2021-01-10 NOTE — Telephone Encounter (Signed)
I tried to reach patient no answer and no voicemail.

## 2021-01-10 NOTE — Telephone Encounter (Signed)
Noted  

## 2021-01-10 NOTE — Telephone Encounter (Signed)
Patient informed and verbalized understanding

## 2021-01-10 NOTE — Telephone Encounter (Signed)
LM on patients mobile number to return my call

## 2021-01-11 ENCOUNTER — Other Ambulatory Visit (HOSPITAL_COMMUNITY): Payer: Self-pay

## 2021-01-11 ENCOUNTER — Telehealth: Payer: Self-pay

## 2021-01-11 ENCOUNTER — Other Ambulatory Visit: Payer: Self-pay | Admitting: Internal Medicine

## 2021-01-11 DIAGNOSIS — B1920 Unspecified viral hepatitis C without hepatic coma: Secondary | ICD-10-CM

## 2021-01-11 NOTE — Progress Notes (Signed)
Order placed for ID referral.

## 2021-01-11 NOTE — Telephone Encounter (Signed)
[facility] Patient Teacher, English as a foreign language completed.    The patient is insured through [employer].  Insurance will only pay for Brand Name Raeanne Gathers or Generic with a PA  We will continue to follow to see if copay assistance is needed.  Ileene Patrick, [facility] Patient [facility] for [facility] Disease Phone: [phone] Fax:  [phone]

## 2021-01-11 NOTE — Telephone Encounter (Signed)
Order placed for ID referral.

## 2021-01-12 ENCOUNTER — Encounter: Payer: Self-pay | Admitting: Family

## 2021-01-12 ENCOUNTER — Other Ambulatory Visit: Payer: Self-pay

## 2021-01-12 ENCOUNTER — Ambulatory Visit (INDEPENDENT_AMBULATORY_CARE_PROVIDER_SITE_OTHER): Payer: BC Managed Care – PPO | Admitting: Family

## 2021-01-12 VITALS — BP 157/97 | HR 90 | Temp 97.9°F | Wt 196.2 lb

## 2021-01-12 DIAGNOSIS — B182 Chronic viral hepatitis C: Secondary | ICD-10-CM | POA: Insufficient documentation

## 2021-01-12 NOTE — Progress Notes (Signed)
Subjective:    Patient ID: Donna Hensley, female    DOB: 06-Feb-1966, 55 y.o.   MRN: [id number]  Chief Complaint  Patient presents with   New Patient (Initial Visit)    HEP C     HPI:  Donna Hensley is a 55 y.o. female with previous medical history of diabetes, hypertension, and hypothyroidism presenting today for evaluation of Hepatitis C.   Donna Hensley was seen in her primary care office on 01/06/21 with screening for Hepatitis C returning positive and RNA level of 544,000. This was her first knowledge of diagnosis. Risk factor for Hepatitis C includes blood transfusion at birth, age and does have a tattoo. No current symptoms and denies abdominal pain, nausea, vomiting, diarrhea, scleral icterus or jaundice. No previous treatment. No personal/family history of liver disease. Drinks alcohol on occasion with no recreational or illicit drug use or tobacco use.   Allergies  Allergen Reactions   Doxycycline Nausea And Vomiting   Oxycodone Nausea And Vomiting   Seasonal Ic [Cholestatin]       Outpatient Medications Prior to Visit  Medication Sig Dispense Refill   albuterol (VENTOLIN HFA) 108 (90 Base) MCG/ACT inhaler Inhale 2 puffs into the lungs every 6 (six) hours as needed for wheezing. 6.7 g 2   BREO ELLIPTA 200-25 MCG/INH AEPB Inhale 1 puff into the lungs daily.      Calcium-Magnesium-Vitamin D (CALCIUM 500 PO) Take 1 tablet by mouth daily.     Cholecalciferol (VITAMIN D3) 1000 units CAPS Take 1 capsule by mouth daily.     Cyanocobalamin (VITAMIN B 12 PO) Take 1 tablet by mouth daily.     fluticasone (FLONASE) 50 MCG/ACT nasal spray USE 2 SPRAYS IN EACH NOSTRIL DAILY 48 g 3   glucose blood test strip Use as instructed 200 each 3   levocetirizine (XYZAL) 5 MG tablet Take 5 mg by mouth every evening.  3   losartan (COZAAR) 50 MG tablet TAKE 1 TABLET DAILY 90 tablet 3   metFORMIN (GLUCOPHAGE) 1000 MG tablet TAKE 1 TABLET TWICE A DAY WITH MEALS 180 tablet 3   montelukast  (SINGULAIR) 10 MG tablet TAKE 1 TABLET DAILY AT BEDTIME 90 tablet 3   Multiple Vitamin (MULTIVITAMIN) tablet Take 1 tablet by mouth daily.     pantoprazole (PROTONIX) 40 MG tablet TAKE 1 TABLET DAILY 90 tablet 1   rosuvastatin (CRESTOR) 5 MG tablet Take one tablet per week. (Patient taking differently: Take 5 mg by mouth every 30 (thirty) days.) 15 tablet 1   VITAMIN E PO Take by mouth daily.     No facility-administered medications prior to visit.     Past Medical History:  Diagnosis Date   Allergy    Asthma    GERD (gastroesophageal reflux disease)    Gestational diabetes    Hypertension       Past Surgical History:  Procedure Laterality Date   APPENDECTOMY  1986   BREAST BIOPSY Left 08/26/2020   Affirm bx-"RIBBON" clip path pending   [place]  2013   COLONOSCOPY WITH PROPOFOL N/A 05/05/2018   Procedure: COLONOSCOPY WITH PROPOFOL;  Surgeon: [name], MD;  Location: [facility] ENDOSCOPY;  Service: Endoscopy;  Laterality: N/A;   ESOPHAGOGASTRODUODENOSCOPY (EGD) WITH PROPOFOL N/A 05/05/2018   Procedure: ESOPHAGOGASTRODUODENOSCOPY (EGD) WITH PROPOFOL;  Surgeon: [name], MD;  Location: [facility] ENDOSCOPY;  Service: Endoscopy;  Laterality: N/A;   TONSILLECTOMY  1987   wisdom tooth removal  Family History  Problem Relation Age of Onset   Hyperlipidemia Mother    Osteoporosis Mother    Hypertension Father    Arthritis Father    Sleep apnea Father    Heart disease Father    Sleep apnea Brother    Arthritis Paternal Grandmother    Diabetes Paternal Grandmother    Breast cancer Neg Hx       Social History   Socioeconomic History   Marital status: Married    Spouse name: Not on file   Number of children: 2   Years of education: Not on file   Highest education level: Not on file  Occupational History   Occupation: csr    Employer: [employer]  Tobacco Use   Smoking status: Former    Packs/day: 0.25     Years: 5.00    Pack years: 1.25    Types: Cigarettes    Quit date: 09/28/1988    Years since quitting: 32.3   Smokeless tobacco: Never   Tobacco comments:    smoked briefly as a teenager  Vaping Use   Vaping Use: Never used  Substance and Sexual Activity   Alcohol use: Yes    Alcohol/week: 1.0 standard drink    Types: 1 Glasses of wine per week    Comment: Occasional 1-2 per month   Drug use: Never   Sexual activity: Not on file  Other Topics Concern   Not on file  Social History Narrative   Regular exercise-yes, walk   Diet: fast food, trying to improve diet         Social Determinants of Health   Financial Resource Strain: Not on file  Food Insecurity: Not on file  Transportation Needs: Not on file  Physical Activity: Not on file  Stress: Not on file  Social Connections: Not on file  Intimate Partner Violence: Not on file      Review of Systems  Constitutional:  Negative for chills, fatigue, fever and unexpected weight change.  Respiratory:  Negative for cough, chest tightness, shortness of breath and wheezing.   Cardiovascular:  Negative for chest pain and leg swelling.  Gastrointestinal:  Negative for abdominal distention, constipation, diarrhea, nausea and vomiting.  Neurological:  Negative for dizziness, weakness, light-headedness and headaches.  Hematological:  Does not bruise/bleed easily.      Objective:    BP (!) 157/97   Pulse 90   Temp 97.9 F (36.6 C) (Oral)   Wt 196 lb 3.2 oz (89 kg)   LMP 07/17/2017   SpO2 98%   BMI 35.89 kg/m  Nursing note and vital signs reviewed.  Physical Exam Constitutional:      General: She is not in acute distress.    Appearance: She is well-developed.  Cardiovascular:     Rate and Rhythm: Normal rate and regular rhythm.     Heart sounds: Normal heart sounds. No murmur heard.   No friction rub. No gallop.  Pulmonary:     Effort: Pulmonary effort is normal. No respiratory distress.     Breath sounds: Normal  breath sounds. No wheezing or rales.  Chest:     Chest wall: No tenderness.  Abdominal:     General: Bowel sounds are normal. There is no distension.     Palpations: Abdomen is soft. There is no mass.     Tenderness: There is no abdominal tenderness. There is no guarding or rebound.  Skin:    General: Skin is warm and dry.  Neurological:  Mental Status: She is alert and oriented to person, place, and time.  Psychiatric:        Behavior: Behavior normal.        Thought Content: Thought content normal.        Judgment: Judgment normal.        Assessment & Plan:   Patient Active Problem List   Diagnosis Date Noted   Chronic hepatitis C without hepatic coma ([place]) 01/12/2021   Abnormal mammogram 01/07/2021   Boil 02/29/2020   Eye irritation 06/13/2019   Conjunctivitis 03/15/2019   Left otitis media 06/23/2015   Essential hypertension 04/28/2015   Respiratory tract infection 04/18/2015   Health care maintenance 02/05/2015   Diabetes ([place]) 09/06/2013   Essential hypertension, benign 11/15/2012   Iron deficiency 10/05/2012   Mild intermittent asthma in adult without complication [id number]   HYPERTHYROIDISM, SUBCLINICAL 08/24/2008   Allergic rhinitis 08/24/2008   GERD 08/24/2008     Problem List Items Addressed This Visit       Digestive   Chronic hepatitis C without hepatic coma ([place]) - Primary    Donna Hensley is a 55 y/o caucasian female with chronic hepatitis C with risk factors of prior blood transfusion, tattoo, and age. Initial viral load of 544,000. Asymptomatic and treatment naive. Discussed the basics of hepatitis C including transmissions, risks if left untreated, treatment options and side effects, plan of care and potential financial costs. Check lab work today including genotype, fibrosis score, Hepatitis B status, and liver function. Plan for treatment pending lab work results. May need to alter her GERD treatment secondary to interactions with Hepatitis C  medications. Plan for follow up 1 month after starting medication.       Relevant Orders   Hepatic function panel   Liver Fibrosis, FibroTest-ActiTest   Protime-INR   Hepatitis B surface antigen   Hepatitis B surface antibody,qualitative   Hepatitis C genotype     I am having Donna Hensley "Chrissy" maintain her multivitamin, Cyanocobalamin (VITAMIN B 12 PO), Calcium-Magnesium-Vitamin D (CALCIUM 500 PO), Vitamin D3, Breo Ellipta, levocetirizine, fluticasone, glucose blood, VITAMIN E PO, rosuvastatin, losartan, montelukast, pantoprazole, albuterol, and metFORMIN.   Follow-up: 1 month after starting medication or sooner if needed.     Terri Piedra, MSN, FNP-C Nurse Practitioner [facility] for Infectious Disease [place] number: [phone]

## 2021-01-12 NOTE — Assessment & Plan Note (Addendum)
Donna Hensley is a 55 y/o caucasian female with chronic hepatitis C with risk factors of prior blood transfusion, tattoo, and age. Initial viral load of 544,000. Asymptomatic and treatment naive. Discussed the basics of hepatitis C including transmissions, risks if left untreated, treatment options and side effects, plan of care and potential financial costs. Check lab work today including genotype, fibrosis score, Hepatitis B status, and liver function. Plan for treatment pending lab work results. May need to alter her GERD treatment secondary to interactions with Hepatitis C medications. Plan for follow up 1 month after starting medication.

## 2021-01-12 NOTE — Patient Instructions (Signed)
Nice to see you.  We will check your lab work today and let you know the results.   Let us know if you have questions.  Follow up 1 month after starting medication.  Limit acetaminophen (Tylenol) usage to no more than 2 grams (2,000 mg) per day.  Avoid alcohol.  Do not share toothbrushes or razors.  Practice safe sex to protect against transmission as well as sexually transmitted disease.    Hepatitis C Hepatitis C is a viral infection of the liver. It can lead to scarring of the liver (cirrhosis), liver failure, or liver cancer. Hepatitis C may go undetected for months or years because people with the infection may not have symptoms, or they may have only mild symptoms. What are the causes? This condition is caused by the hepatitis C virus (HCV). The virus can spread from person to person (is contagious) through: Blood. Childbirth. A woman who has hepatitis C can pass it to her baby during birth. Bodily fluids, such as breast milk, tears, semen, vaginal fluids, and saliva. Blood transfusions or organ transplants done in the [place] before 1992.  What increases the risk? The following factors may make you more likely to develop this condition: Having contact with unclean (contaminated) needles or syringes. This may result from: Acupuncture. Tattoing. Body piercing. Injecting drugs. Having unprotected sex with someone who is infected. Needing treatment to filter your blood (kidney dialysis). Having HIV (human immunodeficiency virus) or AIDS (acquired immunodeficiency syndrome). Working in a job that involves contact with blood or bodily fluids, such as health care.  What are the signs or symptoms? Symptoms of this condition include: Fatigue. Loss of appetite. Nausea. Vomiting. Abdominal pain. Dark yellow urine. Yellowish skin and eyes (jaundice). Itchy skin. Clay-colored bowel movements. Joint pain. Bleeding and bruising easily. Fluid building up in your  stomach (ascites).  In some cases, you may not have any symptoms. How is this diagnosed? This condition is diagnosed with: Blood tests. Other tests to check how well your liver is functioning. They may include: Magnetic resonance elastography (MRE). This imaging test uses MRIs and sound waves to measure liver stiffness. Transient elastography. This imaging test uses ultrasounds to measure liver stiffness. Liver biopsy. This test requires taking a small tissue sample from your liver to examine it under a microscope.  How is this treated? Your health care provider may perform noninvasive tests or a liver biopsy to help decide the best course of treatment. Treatment may include: Antiviral medicines and other medicines. Follow-up treatments every 6-12 months for infections or other liver conditions. Receiving a donated liver (liver transplant).  Follow these instructions at home: Medicines Take over-the-counter and prescription medicines only as told by your health care provider. Take your antiviral medicine as told by your health care provider. Do not stop taking the antiviral even if you start to feel better. Do not take any medicines unless approved by your health care provider, including over-the-counter medicines and birth control pills. Activity Rest as needed. Do not have sex unless approved by your health care provider. Ask your health care provider when you may return to school or work. Eating and drinking Eat a balanced diet with plenty of fruits and vegetables, whole grains, and lowfat (lean) meats or non-meat proteins (such as beans or tofu). Drink enough fluids to keep your urine clear or pale yellow. Do not drink alcohol. General instructions Do not share toothbrushes, nail clippers, or razors. Wash your hands frequently with soap and water. If soap and  water are not available, use hand sanitizer. Cover any cuts or open sores on your skin to prevent spreading the  virus. Keep all follow-up visits as told by your health care provider. This is important. You may need follow-up visits every 6-12 months. How is this prevented? There is no vaccine for hepatitis C. The only way to prevent the disease is to reduce the risk of exposure to the virus. Make sure you: Wash your hands frequently with soap and water. If soap and water are not available, use hand sanitizer. Do not share needles or syringes. Practice safe sex and use condoms. Avoid handling blood or bodily fluids without gloves or other protection. Avoid getting tattoos or piercings in shops or other locations that are not clean.  Contact a health care provider if: You have a fever. You develop abdominal pain. You pass dark urine. You pass clay-colored stools. You develop joint pain. Get help right away if: You have increasing fatigue or weakness. You lose your appetite. You cannot eat or drink without vomiting. You develop jaundice or your jaundice gets worse. You bruise or bleed easily. Summary Hepatitis C is a viral infection of the liver. It can lead to scarring of the liver (cirrhosis), liver failure, or liver cancer. The hepatitis C virus (HCV) causes this condition. The virus can pass from person to person (is contagious). You should not take any medicines unless approved by your health care provider. This includes over-the-counter medicines and birth control pills. This information is not intended to replace advice given to you by your health care provider. Make sure you discuss any questions you have with your health care provider. Document Released: 04/13/2000 Document Revised: 05/22/2016 Document Reviewed: 05/22/2016 Elsevier Interactive Patient Education  [employer].

## 2021-01-13 DIAGNOSIS — J3089 Other allergic rhinitis: Secondary | ICD-10-CM | POA: Diagnosis not present

## 2021-01-19 ENCOUNTER — Other Ambulatory Visit (HOSPITAL_COMMUNITY): Payer: Self-pay

## 2021-01-22 LAB — LIVER FIBROSIS, FIBROTEST-ACTITEST
ALT: 42 U/L — ABNORMAL HIGH (ref 6–29)
Alpha-2-Macroglobulin: 343 mg/dL — ABNORMAL HIGH (ref 106–279)
Apolipoprotein A1: 172 mg/dL (ref 101–198)
Bilirubin: 0.8 mg/dL (ref 0.2–1.2)
Fibrosis Score: 0.6
GGT: 92 U/L — ABNORMAL HIGH (ref 3–70)
Haptoglobin: 79 mg/dL (ref 43–212)
Necroinflammat ACT Score: 0.32
Reference ID: 4036722

## 2021-01-22 LAB — HEPATIC FUNCTION PANEL
AG Ratio: 1.6 (calc) (ref 1.0–2.5)
ALT: 42 U/L — ABNORMAL HIGH (ref 6–29)
AST: 34 U/L (ref 10–35)
Albumin: 4.6 g/dL (ref 3.6–5.1)
Alkaline phosphatase (APISO): 99 U/L (ref 37–153)
Bilirubin, Direct: 0.3 mg/dL — ABNORMAL HIGH (ref 0.0–0.2)
Globulin: 2.8 g/dL (calc) (ref 1.9–3.7)
Indirect Bilirubin: 0.5 mg/dL (calc) (ref 0.2–1.2)
Total Bilirubin: 0.8 mg/dL (ref 0.2–1.2)
Total Protein: 7.4 g/dL (ref 6.1–8.1)

## 2021-01-22 LAB — PROTIME-INR
INR: 0.9
Prothrombin Time: 9.5 s (ref 9.0–11.5)

## 2021-01-22 LAB — HEPATITIS B SURFACE ANTIGEN: Hepatitis B Surface Ag: NONREACTIVE

## 2021-01-22 LAB — HEPATITIS B SURFACE ANTIBODY,QUALITATIVE: Hep B S Ab: NONREACTIVE

## 2021-01-22 LAB — HEPATITIS C GENOTYPE

## 2021-01-24 ENCOUNTER — Ambulatory Visit: Payer: BC Managed Care – PPO | Admitting: Internal Medicine

## 2021-01-24 ENCOUNTER — Encounter: Payer: Self-pay | Admitting: Internal Medicine

## 2021-01-24 ENCOUNTER — Other Ambulatory Visit: Payer: Self-pay

## 2021-01-24 DIAGNOSIS — R058 Other specified cough: Secondary | ICD-10-CM | POA: Diagnosis not present

## 2021-01-24 MED ORDER — BUDESONIDE-FORMOTEROL FUMARATE 80-4.5 MCG/ACT IN AERO: INHALATION_SPRAY | RESPIRATORY_TRACT | 11 refills | Status: DC

## 2021-01-24 MED ORDER — FAMOTIDINE 20 MG PO TABS: ORAL_TABLET | ORAL | 0 refills | Status: AC

## 2021-01-24 NOTE — Progress Notes (Signed)
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 Still have inflammation in cx. Also checking for OSA.  Dyspnea:  hoursework ok. Walking ok but no Cough: p supper and hs x 15 min and wakes up coughing > nothing produced/ worse with smells  Sleep: bed is flat  SABA use: not used albuterol  Takes protonix with meals Problem with sleep is insomnia/ freq awakening with cough   No obvious other patterns in day to day or daytime variability or assoc excess/ purulent sputum or mucus plugs or hemoptysis or cp or chest tightness, subjective wheeze or overt sinus or hb symptoms.     Also denies any obvious fluctuation of symptoms with weather or environmental changes or other aggravating or alleviating factors except as outlined above   No unusual exposure hx or h/o childhood pna/ asthma or knowledge of premature birth.  Current Allergies, Complete Past Medical History, Past Surgical History, Family History, and Social History were reviewed in [employer] record.  ROS  The following are not active complaints unless bolded Hoarseness, sore throat, dysphagia, dental problems, itching, sneezing,  nasal congestion or discharge of excess mucus or purulent  secretions, ear ache,   fever, chills, sweats, unintended wt loss or wt gain, classically pleuritic or exertional cp,  orthopnea pnd or arm/hand swelling  or leg swelling, presyncope, palpitations, abdominal pain, anorexia, nausea, vomiting, diarrhea  or change in bowel habits or change in bladder habits, change in stools or change in urine, dysuria, hematuria,  rash, arthralgias, visual complaints, headache, numbness, weakness or ataxia or problems with walking or coordination,  change in mood or  memory.           Past Medical History:  Diagnosis Date   Allergy    Asthma    GERD (gastroesophageal reflux disease)    Gestational diabetes    Hypertension     Outpatient Medications Prior to Visit  Medication Sig Dispense Refill   BREO ELLIPTA 200-25 MCG/INH AEPB Inhale 1 puff into the lungs daily.      Calcium-Magnesium-Vitamin D (CALCIUM 500 PO) Take 1 tablet by mouth daily.     Cholecalciferol (VITAMIN D3) 1000 units CAPS Take 1 capsule by mouth daily.     Cyanocobalamin (VITAMIN B 12 PO) Take 1 tablet by mouth daily.     fluticasone (FLONASE) 50 MCG/ACT nasal spray USE 2 SPRAYS IN EACH NOSTRIL DAILY 48 g 3   glucose blood test strip Use as instructed 200 each 3   levocetirizine (XYZAL) 5 MG tablet Take 5 mg by mouth every evening.  3   losartan (COZAAR) 50 MG tablet TAKE 1 TABLET DAILY  90 tablet 3   metFORMIN (GLUCOPHAGE) 1000 MG tablet TAKE 1 TABLET TWICE A DAY WITH MEALS 180 tablet 3   montelukast (SINGULAIR) 10 MG tablet TAKE 1 TABLET DAILY AT BEDTIME 90 tablet 3   Multiple Vitamin (MULTIVITAMIN) tablet Take 1 tablet by mouth daily.     pantoprazole (PROTONIX) 40 MG tablet TAKE 1 TABLET DAILY 90 tablet 1   rosuvastatin (CRESTOR) 5 MG tablet Take one tablet per week. (Patient taking differently: Take 5 mg by mouth every 30 (thirty) days.) 15 tablet 1   VITAMIN E PO Take by mouth daily.     albuterol (VENTOLIN HFA) 108 (90 Base) MCG/ACT inhaler Inhale 2 puffs into the lungs every 6  (six) hours as needed for wheezing. (Patient not taking: Reported on 01/24/2021) 6.7 g 2   No facility-administered medications prior to visit.     Objective:     BP (!) 142/90 (BP Location: Left Arm, Patient Position: Sitting, Cuff Size: Normal)   Pulse 87   Temp (!) 97.1 F (36.2 C) (Temporal)   Ht 5\' 2"  (1.575 m)   Wt 197 lb 6.4 oz (89.5 kg)   LMP 07/17/2017   SpO2 97% Comment: ra  BMI 36.10 kg/m   SpO2: 97 % (ra)  Pleasant amb wf nad   HEENT : pt wearing mask not removed for exam due to covid -19 concerns.    NECK :  without JVD/Nodes/TM/ nl carotid upstrokes bilaterally   LUNGS: no acc muscle use,  Nl contour chest which is clear to A and P bilaterally without cough on insp or exp maneuvers   CV:  RRR  no s3 or murmur or increase in P2, and no edema   ABD:  soft and nontender with nl inspiratory excursion in the supine position. No bruits or organomegaly appreciated, bowel sounds nl  MS:  Nl gait/ ext warm without deformities, calf tenderness, cyanosis or clubbing No obvious joint restrictions   SKIN: warm and dry without lesions    NEURO:  alert, approp, nl sensorium with  no motor or cerebellar deficits apparent.     I personally reviewed images and agree with radiology impression as follows:  CXR:   pa and lateral 11/22/53 No active cardiopulmonary disease.      Assessment   Upper airway cough syndrome Onset with covid July 2022 while on high dose breo - 01/24/2021 d/c breo and changed to symb 80 2bid with max gerd rx and 1st gen H1 blockers per guidelines  / f/u if not better w/in 2 weeks   Upper airway cough syndrome (previously labeled PNDS),  is so named because it's frequently impossible to sort out how much is  CR/sinusitis with freq throat clearing (which can be related to primary GERD)   vs  causing  secondary (" extra esophageal")  GERD from wide swings in gastric pressure that occur with throat clearing, often  promoting self use of mint and  menthol lozenges that reduce the lower esophageal sphincter tone and exacerbate the problem further in a cyclical fashion.   These are the same pts (now being labeled as having "irritable larynx syndrome" by some cough centers) who not infrequently have a history of having failed to tolerate ace inhibitors,  dry powder inhalers(esp hi doses of BREO)  or biphosphonates or report having atypical/extraesophageal reflux symptoms that don't respond to standard doses of PPI  and are easily confused as having aecopd or asthma flares by even experienced allergists/ pulmonologists (myself included).    -  The proper method of use, as well as anticipated side effects, of a metered-dose inhaler were discussed and demonstrated to the patient using teach back method.    Rec: Max gerd rx/ 1st gen H1 blockers per guidelines   As above, f/u if not 100% better in 2 weeks  If concerned with OSA can refer to sleep medicine but will work on upper airway issues first as above          Each maintenance medication was reviewed in detail including emphasizing most importantly the difference between maintenance and prns and under what circumstances the prns are to be triggered using an action plan format where appropriate.  Total time for H and P, chart review, counseling, reviewing hfa  device(s) and generating customized AVS unique to this office visit / same day charting =46 min          Christinia Gully, MD 01/24/2021

## 2021-01-24 NOTE — Assessment & Plan Note (Addendum)
Onset with covid July 2022 while on high dose breo - 01/24/2021 d/c breo and changed to symb 80 2bid with max gerd rx and 1st gen H1 blockers per guidelines  / f/u if not better w/in 2 weeks   Upper airway cough syndrome (previously labeled PNDS),  is so named because it's frequently impossible to sort out how much is  CR/sinusitis with freq throat clearing (which can be related to primary GERD)   vs  causing  secondary (" extra esophageal")  GERD from wide swings in gastric pressure that occur with throat clearing, often  promoting self use of mint and menthol lozenges that reduce the lower esophageal sphincter tone and exacerbate the problem further in a cyclical fashion.   These are the same pts (now being labeled as having "irritable larynx syndrome" by some cough centers) who not infrequently have a history of having failed to tolerate ace inhibitors,  dry powder inhalers(esp hi doses of BREO)  or biphosphonates or report having atypical/extraesophageal reflux symptoms that don't respond to standard doses of PPI  and are easily confused as having aecopd or asthma flares by even experienced allergists/ pulmonologists (myself included).    - The proper method of use, as well as anticipated side effects, of a metered-dose inhaler were discussed and demonstrated to the patient using teach back method.    Rec: Max gerd rx/ 1st gen H1 blockers per guidelines   As above, f/u if not 100% better in 2 weeks  If concerned with OSA can refer to sleep medicine but will work on upper airway issues first as above          Each maintenance medication was reviewed in detail including emphasizing most importantly the difference between maintenance and prns and under what circumstances the prns are to be triggered using an action plan format where appropriate.  Total time for H and P, chart review, counseling, reviewing hfa  device(s) and generating customized AVS unique to this office visit / same day  charting =46 min

## 2021-01-24 NOTE — Patient Instructions (Addendum)
Stop breo 200   Try symbicort 80 Take 2 puffs first thing in am and then another 2 puffs about 12 hours later.   Work on inhaler technique:  relax and gently blow all the way out then take a nice smooth full deep breath back in, triggering the inhaler at same time you start breathing in.  Hold for up to 5 seconds if you can. Blow out thru nose. Rinse and gargle with water when done.  If mouth or throat bother you at all,  try brushing teeth/gums/tongue with arm and hammer toothpaste/ make a slurry and gargle and spit out.   Only use your albuterol as a rescue medication to be used if you can't catch your breath by resting or doing a relaxed purse lip breathing pattern.  - The less you use it, the better it will work when you need it. - Ok to use up to 2 puffs  every 4 hours if you must but call for immediate appointment if use goes up over your usual need - Don't leave home without it !!  (think of it like the spare tire for your car)   For drainage / throat tickle try take CHLORPHENIRAMINE  4 mg  (Chlortab 4mg   at [employer] should be easiest to find in the green box)  take one every 4 hours as needed - available over the counter- may cause drowsiness so start with just one  dose or two an hour before bedtime and see how you tolerate it before trying in daytime  Pantoprazole (protonix) 40 mg   Take  30-60 min before first meal of the day and Pepcid (famotidine)  20 mg after supper until completely better for several weeks.  For cough >  delsym 2 tsp every 12 hours as needed   GERD (REFLUX)  is an extremely common cause of respiratory symptoms just like yours , many times with no obvious heartburn at all.    It can be treated with medication, but also with lifestyle changes including elevation of the head of your bed (ideally with 6 -8inch blocks under the headboard of your bed),  Smoking cessation, avoidance of late meals, excessive alcohol, and avoid fatty foods, chocolate, peppermint,  colas, red wine, and acidic juices such as orange juice.  NO MINT OR MENTHOL PRODUCTS SO NO COUGH DROPS  USE SUGARLESS CANDY INSTEAD (Jolley ranchers or Stover's or Life Savers) or even ice chips will also do - the key is to swallow to prevent all throat clearing. NO OIL BASED VITAMINS - use powdered substitutes.  Avoid fish oil when coughing.

## 2021-01-26 DIAGNOSIS — J3089 Other allergic rhinitis: Secondary | ICD-10-CM | POA: Diagnosis not present

## 2021-02-02 DIAGNOSIS — J3089 Other allergic rhinitis: Secondary | ICD-10-CM | POA: Diagnosis not present

## 2021-02-03 ENCOUNTER — Other Ambulatory Visit: Payer: Self-pay | Admitting: Family

## 2021-02-03 ENCOUNTER — Other Ambulatory Visit (HOSPITAL_COMMUNITY): Payer: Self-pay

## 2021-02-03 DIAGNOSIS — B182 Chronic viral hepatitis C: Secondary | ICD-10-CM

## 2021-02-03 MED ORDER — SOFOSBUVIR-VELPATASVIR 400-100 MG PO TABS
1.0000 | ORAL_TABLET | Freq: Every day | ORAL | 2 refills | Status: DC
  Filled 2021-02-03: qty 28, 28d supply, fill #0

## 2021-02-08 ENCOUNTER — Telehealth: Payer: Self-pay

## 2021-02-08 ENCOUNTER — Other Ambulatory Visit (HOSPITAL_COMMUNITY): Payer: Self-pay

## 2021-02-08 NOTE — Telephone Encounter (Signed)
[facility] Patient Advocate Encounter  I went on to covermymeds on 01/25/21 and 01/27/21 both time I received a message back saying "No Prior Auth is required at this time".  We sent script to [facility] and they called saying medication will need PA.  I called [facility] @ [phone] and spoke to a rep and the medication will need a PA .   Claim # [id number]  I will follow up with PA in 48-72 hours.  Patient is aware.  Ileene Patrick, [facility] Patient [facility] Phone: [phone] Fax:  [phone]

## 2021-02-09 DIAGNOSIS — J3089 Other allergic rhinitis: Secondary | ICD-10-CM | POA: Diagnosis not present

## 2021-02-09 LAB — HM DIABETES EYE EXAM

## 2021-02-10 ENCOUNTER — Other Ambulatory Visit: Payer: Self-pay

## 2021-02-10 ENCOUNTER — Ambulatory Visit (HOSPITAL_COMMUNITY)
Admission: RE | Admit: 2021-02-10 | Discharge: 2021-02-10 | Disposition: A | Discharge status: Home / Self Care | Payer: BC Managed Care – PPO | Source: Ambulatory Visit | Attending: Family | Admitting: Family

## 2021-02-10 DIAGNOSIS — K7400 Hepatic fibrosis, unspecified: Secondary | ICD-10-CM | POA: Diagnosis not present

## 2021-02-10 DIAGNOSIS — B182 Chronic viral hepatitis C: Secondary | ICD-10-CM | POA: Insufficient documentation

## 2021-02-13 ENCOUNTER — Other Ambulatory Visit (HOSPITAL_COMMUNITY): Payer: Self-pay

## 2021-02-13 NOTE — Telephone Encounter (Signed)
Butch Penny - can you check on this please? Thank you!

## 2021-02-13 NOTE — Telephone Encounter (Signed)
I will check today and give her a call .

## 2021-02-13 NOTE — Telephone Encounter (Signed)
Thank you :)

## 2021-02-14 ENCOUNTER — Other Ambulatory Visit (HOSPITAL_COMMUNITY): Payer: Self-pay

## 2021-02-16 DIAGNOSIS — J3089 Other allergic rhinitis: Secondary | ICD-10-CM | POA: Diagnosis not present

## 2021-02-21 ENCOUNTER — Other Ambulatory Visit: Payer: Self-pay | Admitting: Pharmacist

## 2021-02-21 ENCOUNTER — Telehealth: Payer: Self-pay

## 2021-02-21 NOTE — Telephone Encounter (Signed)
[facility] Patient Advocate Encounter  Prior Authorization for Raeanne Gathers has been approved.    PA# [id number] Effective dates: 01/22/21 through 05/16/21  Patients co-pay is $5.00.      [facility] will continue to follow.  Ileene Patrick, [facility] Patient [facility] Phone: [phone] Fax:  [phone]

## 2021-02-21 NOTE — Progress Notes (Signed)
Was going to send script for Epclusa but [facility] already has prescription prior to need for PA.  Alfonse Spruce, PharmD, CPP Clinical Pharmacist Practitioner [facility]

## 2021-02-23 ENCOUNTER — Telehealth: Payer: Self-pay

## 2021-02-23 DIAGNOSIS — J301 Allergic rhinitis due to pollen: Secondary | ICD-10-CM | POA: Diagnosis not present

## 2021-02-23 DIAGNOSIS — J4520 Mild intermittent asthma, uncomplicated: Secondary | ICD-10-CM

## 2021-02-23 DIAGNOSIS — J3089 Other allergic rhinitis: Secondary | ICD-10-CM | POA: Diagnosis not present

## 2021-02-23 NOTE — Telephone Encounter (Signed)
Called and spoke to patient. Will cancel RX from [facility] and will send in RX for Symbicort to [facility].

## 2021-02-23 NOTE — Telephone Encounter (Signed)
Attempted to change over patients medication through [facility] from [facility]. I was unable to do so as it declined RX for symbicort.

## 2021-03-01 ENCOUNTER — Other Ambulatory Visit: Payer: Self-pay | Admitting: Internal Medicine

## 2021-03-01 ENCOUNTER — Encounter: Payer: Self-pay | Admitting: Internal Medicine

## 2021-03-01 ENCOUNTER — Other Ambulatory Visit (HOSPITAL_COMMUNITY): Payer: Self-pay

## 2021-03-01 MED ORDER — PREDNISONE 10 MG PO TABS: ORAL_TABLET | ORAL | 0 refills | Status: DC

## 2021-03-01 MED ORDER — CEFDINIR 300 MG PO CAPS: 300.0000 mg | ORAL_CAPSULE | Freq: Two times a day (BID) | ORAL | 0 refills | Status: DC

## 2021-03-01 NOTE — Telephone Encounter (Signed)
Rx sent in for omnicef and prednisone taper for her to have when traveling.

## 2021-03-02 DIAGNOSIS — J3089 Other allergic rhinitis: Secondary | ICD-10-CM | POA: Diagnosis not present

## 2021-03-08 DIAGNOSIS — Z20822 Contact with and (suspected) exposure to covid-19: Secondary | ICD-10-CM | POA: Diagnosis not present

## 2021-03-09 ENCOUNTER — Other Ambulatory Visit: Payer: Self-pay | Admitting: Internal Medicine

## 2021-03-09 DIAGNOSIS — J3089 Other allergic rhinitis: Secondary | ICD-10-CM | POA: Diagnosis not present

## 2021-03-28 DIAGNOSIS — J3089 Other allergic rhinitis: Secondary | ICD-10-CM | POA: Diagnosis not present

## 2021-03-31 ENCOUNTER — Ambulatory Visit (INDEPENDENT_AMBULATORY_CARE_PROVIDER_SITE_OTHER): Payer: BC Managed Care – PPO | Admitting: Internal Medicine

## 2021-03-31 ENCOUNTER — Other Ambulatory Visit: Payer: Self-pay

## 2021-03-31 ENCOUNTER — Encounter: Payer: Self-pay | Admitting: Internal Medicine

## 2021-03-31 VITALS — BP 140/90 | HR 82 | Temp 96.3°F | Ht 62.0 in | Wt 192.8 lb

## 2021-03-31 DIAGNOSIS — Z0000 Encounter for general adult medical examination without abnormal findings: Secondary | ICD-10-CM

## 2021-03-31 DIAGNOSIS — K219 Gastro-esophageal reflux disease without esophagitis: Secondary | ICD-10-CM | POA: Diagnosis not present

## 2021-03-31 DIAGNOSIS — E611 Iron deficiency: Secondary | ICD-10-CM

## 2021-03-31 DIAGNOSIS — I10 Essential (primary) hypertension: Secondary | ICD-10-CM | POA: Diagnosis not present

## 2021-03-31 DIAGNOSIS — J4520 Mild intermittent asthma, uncomplicated: Secondary | ICD-10-CM

## 2021-03-31 DIAGNOSIS — B182 Chronic viral hepatitis C: Secondary | ICD-10-CM

## 2021-03-31 DIAGNOSIS — E1165 Type 2 diabetes mellitus with hyperglycemia: Secondary | ICD-10-CM

## 2021-03-31 MED ORDER — BUDESONIDE-FORMOTEROL FUMARATE 80-4.5 MCG/ACT IN AERO: INHALATION_SPRAY | RESPIRATORY_TRACT | 3 refills | Status: DC

## 2021-03-31 NOTE — Progress Notes (Signed)
Patient ID: Donna Hensley, female   DOB: [birth year], 55 y.o.   MRN: [id number]   Subjective:    Patient ID: Donna Hensley, female    DOB: 03-19-1966, 55 y.o.   MRN: [id number]  This visit occurred during the SARS-CoV-2 public health emergency.  Safety protocols were in place, including screening questions prior to the visit, additional usage of staff PPE, and extensive cleaning of exam room while observing appropriate contact time as indicated for disinfecting solutions.   Patient here for her physical exam.   Chief Complaint  Patient presents with   Annual Exam   .   HPI Recently went to visit her daughter in [place].  Returned this past week.  Daughter is expecting - due date June 16.  Overall she feels she is doing well.  Breathing stable.  No increased sob.  No increased cough or congestion.  No abdominal pain.  Bowels moving.  Recently found - hepatitis C positive.  Seeing ID.  Just started epclusa.  Blood sugars doing well - am sugars 88-96.  Had questions about PPI/pepcid.     Past Medical History:  Diagnosis Date   Allergy    Asthma    GERD (gastroesophageal reflux disease)    Gestational diabetes    Hypertension    Past Surgical History:  Procedure Laterality Date   APPENDECTOMY  1986   BREAST BIOPSY Left 08/26/2020   Affirm bx-"RIBBON" clip path pending   [place]  2013   COLONOSCOPY WITH PROPOFOL N/A 05/05/2018   Procedure: COLONOSCOPY WITH PROPOFOL;  Surgeon: [name], MD;  Location: [facility] ENDOSCOPY;  Service: Endoscopy;  Laterality: N/A;   ESOPHAGOGASTRODUODENOSCOPY (EGD) WITH PROPOFOL N/A 05/05/2018   Procedure: ESOPHAGOGASTRODUODENOSCOPY (EGD) WITH PROPOFOL;  Surgeon: [name], MD;  Location: [facility] ENDOSCOPY;  Service: Endoscopy;  Laterality: N/A;   TONSILLECTOMY  1987   wisdom tooth removal     Family History  Problem Relation Age of Onset   Hyperlipidemia Mother    Osteoporosis Mother     Hypertension Father    Arthritis Father    Sleep apnea Father    Heart disease Father    Sleep apnea Brother    Arthritis Paternal Grandmother    Diabetes Paternal Grandmother    Breast cancer Neg Hx    Social History   Socioeconomic History   Marital status: Married    Spouse name: Not on file   Number of children: 2   Years of education: Not on file   Highest education level: Not on file  Occupational History   Occupation: csr    Employer: [employer]  Tobacco Use   Smoking status: Former    Packs/day: 0.25    Years: 5.00    Pack years: 1.25    Types: Cigarettes    Quit date: 09/28/1988    Years since quitting: 32.5   Smokeless tobacco: Never   Tobacco comments:    smoked briefly as a teenager  Vaping Use   Vaping Use: Never used  Substance and Sexual Activity   Alcohol use: Yes    Alcohol/week: 1.0 standard drink    Types: 1 Glasses of wine per week    Comment: Occasional 1-2 per month   Drug use: Never   Sexual activity: Not on file  Other Topics Concern   Not on file  Social History Narrative   Regular exercise-yes, walk   Diet: fast food, trying to improve diet  Social Determinants of Health   Financial Resource Strain: Not on file  Food Insecurity: Not on file  Transportation Needs: Not on file  Physical Activity: Not on file  Stress: Not on file  Social Connections: Not on file     Review of Systems  Constitutional:  Negative for appetite change and unexpected weight change.  HENT:  Negative for congestion, sinus pressure and sore throat.   Eyes:  Negative for pain and visual disturbance.  Respiratory:  Negative for cough, chest tightness and shortness of breath.   Cardiovascular:  Negative for chest pain, palpitations and leg swelling.  Gastrointestinal:  Negative for abdominal pain, diarrhea, nausea and vomiting.  Genitourinary:  Negative for difficulty urinating and dysuria.  Musculoskeletal:  Negative for joint swelling  and myalgias.  Skin:  Negative for color change and rash.  Neurological:  Negative for dizziness, light-headedness and headaches.  Hematological:  Negative for adenopathy. Does not bruise/bleed easily.  Psychiatric/Behavioral:  Negative for agitation and dysphoric mood.       Objective:     BP 140/90   Pulse 82   Temp (!) 96.3 F (35.7 C) (Skin)   Ht 5' 2"  (1.575 m)   Wt 192 lb 12.8 oz (87.5 kg)   LMP 07/17/2017   SpO2 97%   BMI 35.26 kg/m  Wt Readings from Last 3 Encounters:  03/31/21 192 lb 12.8 oz (87.5 kg)  01/24/21 197 lb 6.4 oz (89.5 kg)  01/12/21 196 lb 3.2 oz (89 kg)    Physical Exam Vitals reviewed.  Constitutional:      General: She is not in acute distress.    Appearance: Normal appearance. She is well-developed.  HENT:     Head: Normocephalic and atraumatic.     Right Ear: External ear normal.     Left Ear: External ear normal.  Eyes:     General: No scleral icterus.       Right eye: No discharge.        Left eye: No discharge.     Conjunctiva/sclera: Conjunctivae normal.  Neck:     Thyroid: No thyromegaly.  Cardiovascular:     Rate and Rhythm: Normal rate and regular rhythm.  Pulmonary:     Effort: No tachypnea, accessory muscle usage or respiratory distress.     Breath sounds: Normal breath sounds. No decreased breath sounds or wheezing.  Chest:  Breasts:    Right: No inverted nipple, mass, nipple discharge or tenderness (no axillary adenopathy).     Left: No inverted nipple, mass, nipple discharge or tenderness (no axilarry adenopathy).  Abdominal:     General: Bowel sounds are normal.     Palpations: Abdomen is soft.     Tenderness: There is no abdominal tenderness.  Musculoskeletal:        General: No swelling or tenderness.     Cervical back: Neck supple.  Lymphadenopathy:     Cervical: No cervical adenopathy.  Skin:    Findings: No erythema or rash.  Neurological:     Mental Status: She is alert and oriented to person, place, and time.   Psychiatric:        Mood and Affect: Mood normal.        Behavior: Behavior normal.     Outpatient Encounter Medications as of 03/31/2021  Medication Sig   albuterol (VENTOLIN HFA) 108 (90 Base) MCG/ACT inhaler Inhale 2 puffs into the lungs every 6 (six) hours as needed for wheezing.   Calcium-Magnesium-Vitamin D (CALCIUM 500 PO) Take  1 tablet by mouth daily.   Cholecalciferol (VITAMIN D3) 1000 units CAPS Take 1 capsule by mouth daily.   Cyanocobalamin (VITAMIN B 12 PO) Take 1 tablet by mouth daily.   famotidine (PEPCID) 20 MG tablet One after supper   fluticasone (FLONASE) 50 MCG/ACT nasal spray USE 2 SPRAYS IN EACH NOSTRIL DAILY   glucose blood test strip Use as instructed   levocetirizine (XYZAL) 5 MG tablet Take 5 mg by mouth every evening.   losartan (COZAAR) 50 MG tablet TAKE 1 TABLET DAILY   metFORMIN (GLUCOPHAGE) 1000 MG tablet TAKE 1 TABLET TWICE A DAY WITH MEALS   montelukast (SINGULAIR) 10 MG tablet TAKE 1 TABLET DAILY AT BEDTIME   Multiple Vitamin (MULTIVITAMIN) tablet Take 1 tablet by mouth daily.   pantoprazole (PROTONIX) 40 MG tablet TAKE 1 TABLET DAILY   rosuvastatin (CRESTOR) 5 MG tablet Take one tablet per week. (Patient taking differently: Take 5 mg by mouth every 30 (thirty) days.)   Sofosbuvir-Velpatasvir (EPCLUSA) 400-100 MG TABS Take 1 tablet by mouth daily. Take 1 tablet by mouth daily.   VITAMIN E PO Take by mouth daily.   [DISCONTINUED] budesonide-formoterol (SYMBICORT) 80-4.5 MCG/ACT inhaler Take 2 puffs first thing in am and then another 2 puffs about 12 hours later.   budesonide-formoterol (SYMBICORT) 80-4.5 MCG/ACT inhaler Take 2 puffs first thing in am and then another 2 puffs about 12 hours later.   [DISCONTINUED] cefdinir (OMNICEF) 300 MG capsule Take 1 capsule (300 mg total) by mouth 2 (two) times daily.   [DISCONTINUED] predniSONE (DELTASONE) 10 MG tablet Take 6 tablets x 1 day and then decrease by 1/2 tablet per day until down to zero mg.   No  facility-administered encounter medications on file as of 03/31/2021.     Lab Results  Component Value Date   WBC 9.1 06/27/2020   HGB 14.6 06/27/2020   HCT 43.2 06/27/2020   PLT 315.0 06/27/2020   GLUCOSE 123 (H) 01/03/2021   CHOL 137 01/03/2021   TRIG 62.0 01/03/2021   HDL 56.60 01/03/2021   LDLCALC 68 01/03/2021   ALT 42 (H) 01/12/2021   ALT 42 (H) 01/12/2021   AST 34 01/12/2021   NA 138 01/03/2021   K 3.6 01/03/2021   CL 102 01/03/2021   CREATININE 0.86 01/03/2021   BUN 12 01/03/2021   CO2 24 01/03/2021   TSH 1.07 02/23/2020   INR 0.9 01/12/2021   HGBA1C 6.9 (H) 01/03/2021   MICROALBUR 2.0 (H) 02/23/2020    US ABDOMEN COMPLETE W/ELASTOGRAPHY  Result Date: 02/10/2021 CLINICAL DATA:  History of hepatitis C, assess for liver fibrosis. EXAM: ULTRASOUND ABDOMEN ULTRASOUND HEPATIC ELASTOGRAPHY TECHNIQUE: Sonography of the upper abdomen was performed. In addition, ultrasound elastography evaluation of the liver was performed. A region of interest was placed within the right lobe of the liver. Following application of a compressive sonographic pulse, tissue compressibility was assessed. Multiple assessments were performed at the selected site. Median tissue compressibility was determined. Previously, hepatic stiffness was assessed by shear wave velocity. Based on recently published Society of Radiologists in Ultrasound consensus article, reporting is now recommended to be performed in the SI units of pressure (kiloPascals) representing hepatic stiffness/elasticity. The obtained result is compared to the published reference standards. (cACLD = compensated Advanced Chronic Liver Disease) COMPARISON:  None. FINDINGS: ULTRASOUND ABDOMEN Gallbladder: Surgically absent. Common bile duct: Diameter: 5 mm Liver: No focal lesion identified. Increased parenchymal echogenicity. No discrete contour nodularity. Portal vein is patent on color Doppler imaging with normal direction  of blood flow towards  the liver. IVC: No abnormality visualized. Pancreas: Visualized portion unremarkable. Spleen: Size and appearance within normal limits. Right Kidney: Length: 11.8 cm. Echogenicity within normal limits. No mass or hydronephrosis visualized. Left Kidney: Length: 11.5 cm. Echogenicity within normal limits. No mass or hydronephrosis visualized. Abdominal aorta: No aneurysm visualized. Other findings: None. ULTRASOUND HEPATIC ELASTOGRAPHY Device: Siemens Helix VTQ Patient position: Supine Transducer 5C1 Number of measurements: 10 Hepatic segment:  8 Median kPa: 3.0 IQR: 0.6 IQR/Median kPa ratio: 0.2 Data quality:  Good Diagnostic category:  < or = 5 kPa: high probability of being normal The use of hepatic elastography is applicable to patients with viral hepatitis and non-alcoholic fatty liver disease. At this time, there is insufficient data for the referenced cut-off values and use in other causes of liver disease, including alcoholic liver disease. Patients, however, may be assessed by elastography and serve as their own reference standard/baseline. In patients with non-alcoholic liver disease, the values suggesting compensated advanced chronic liver disease (cACLD) may be lower, and patients may need additional testing with elasticity results of 7-9 kPa. Please note that abnormal hepatic elasticity and shear wave velocities may also be identified in clinical settings other than with hepatic fibrosis, such as: acute hepatitis, elevated right heart and central venous pressures including use of beta blockers, veno-occlusive disease (Budd-Chiari), infiltrative processes such as mastocytosis/amyloidosis/infiltrative tumor/lymphoma, extrahepatic cholestasis, with hyperemia in the post-prandial state, and with liver transplantation. Correlation with patient history, laboratory data, and clinical condition recommended. Diagnostic Categories: < or =5 kPa: high probability of being normal < or =9 kPa: in the absence of other  known clinical signs, rules out cACLD >9 kPa and ?13 kPa: suggestive of cACLD, but needs further testing >13 kPa: highly suggestive of cACLD > or =17 kPa: highly suggestive of cACLD with an increased probability of clinically significant portal hypertension IMPRESSION: ULTRASOUND ABDOMEN: Diffusely increased parenchymal echogenicity nonspecific but most commonly seen with hepatic steatosis or hepatocellular disease. No overt hepatic lesion visualized. ULTRASOUND HEPATIC ELASTOGRAPHY: Median kPa:  3 Diagnostic category:  < or = 5 kPa: high probability of being normal Electronically Signed   By: Dahlia Bailiff M.D.   On: 02/10/2021 21:15       Assessment & Plan:   Problem List Items Addressed This Visit     Chronic hepatitis C without hepatic coma (HCC)    Seeing ID.  Just started epclusa.  Continue f/u with ID.       Diabetes ([place])    Low carb diet and exercise.  Sugar as outlined. Weight down.  Follow met b and a1c.       Essential hypertension    Blood pressure doing well.  Continue losartan.  Follow pressures.  Follow metabolic panel.       GERD    Changed to prilosec.  Continue am prilosec.  pepcid in pm.        Health care maintenance    Physical today 03/31/21.  PAP 02/02/19 - negative with negative HPV.  Colonoscopy 04/2018 - recommended f/u in 3 years. Last mammogram - abnormal - recommended biopsy.  The patient underwent radiology directed stereotactic biopsy of a density in the left breast on August 26, 2020. Review of the postbiopsy views shows the area of concern completely removed. Pathology was benign. Dr Bary Castilla recommended return to screening mammogram 06/2021.         Iron deficiency    History of low iron.  Check cbc and ferritin with next labs.  Mild intermittent asthma in adult without complication    Breathing stable.  Has seen Dr Melvyn Novas.  Continue symbicort.        Relevant Medications   budesonide-formoterol (SYMBICORT) 80-4.5 MCG/ACT inhaler   Other Visit  Diagnoses     Routine general medical examination at a health care facility    -  Primary        Einar Pheasant, MD

## 2021-04-01 ENCOUNTER — Encounter: Payer: Self-pay | Admitting: Internal Medicine

## 2021-04-01 NOTE — Assessment & Plan Note (Signed)
History of low iron.  Check cbc and ferritin with next labs.

## 2021-04-01 NOTE — Assessment & Plan Note (Signed)
Seeing ID.  Just started epclusa.  Continue f/u with ID.

## 2021-04-01 NOTE — Assessment & Plan Note (Signed)
Low carb diet and exercise.  Sugar as outlined. Weight down.  Follow met b and a1c.

## 2021-04-01 NOTE — Assessment & Plan Note (Signed)
Changed to prilosec.  Continue am prilosec.  pepcid in pm.

## 2021-04-01 NOTE — Assessment & Plan Note (Signed)
Physical today 03/31/21.  PAP 02/02/19 - negative with negative HPV.  Colonoscopy 04/2018 - recommended f/u in 3 years. Last mammogram - abnormal - recommended biopsy.  The patient underwent radiology directed stereotactic biopsy of a density in the left breast on August 26, 2020. Review of the postbiopsy views shows the area of concern completely removed. Pathology was benign. Dr Bary Castilla recommended return to screening mammogram 06/2021.

## 2021-04-01 NOTE — Assessment & Plan Note (Signed)
Blood pressure doing well.  Continue losartan.  Follow pressures.  Follow metabolic panel.

## 2021-04-01 NOTE — Assessment & Plan Note (Signed)
Breathing stable.  Has seen Dr Melvyn Novas.  Continue symbicort.

## 2021-04-13 DIAGNOSIS — J3089 Other allergic rhinitis: Secondary | ICD-10-CM | POA: Diagnosis not present

## 2021-04-18 DIAGNOSIS — J3089 Other allergic rhinitis: Secondary | ICD-10-CM | POA: Diagnosis not present

## 2021-04-27 DIAGNOSIS — J3089 Other allergic rhinitis: Secondary | ICD-10-CM | POA: Diagnosis not present

## 2021-05-05 ENCOUNTER — Ambulatory Visit: Payer: BC Managed Care – PPO | Admitting: Pharmacist

## 2021-05-05 ENCOUNTER — Telehealth: Payer: Self-pay | Admitting: Pharmacist

## 2021-05-05 DIAGNOSIS — J3089 Other allergic rhinitis: Secondary | ICD-10-CM | POA: Diagnosis not present

## 2021-05-05 NOTE — Telephone Encounter (Signed)
Patient LVM for front desk stating she was in an accident and needed to speak with me. I called back and LVM stating to call us to reschedule her appointment. Stated she didn't need to rush with [place] and to let us know if she needs more medicine.  Alfonse Spruce, PharmD, CPP Clinical Pharmacist Practitioner [facility]

## 2021-05-11 ENCOUNTER — Ambulatory Visit (INDEPENDENT_AMBULATORY_CARE_PROVIDER_SITE_OTHER): Payer: BC Managed Care – PPO | Admitting: Pharmacist

## 2021-05-11 ENCOUNTER — Other Ambulatory Visit: Payer: Self-pay

## 2021-05-11 DIAGNOSIS — B182 Chronic viral hepatitis C: Secondary | ICD-10-CM

## 2021-05-11 NOTE — Progress Notes (Signed)
05/11/2021  HPI: Donna Hensley is a 56 y.o. female who presents to the [facility] for Hepatitis C follow-up.  Medication: Raeanne Gathers  Start Date: 03/27/21  Hepatitis C Genotype: 1b  Fibrosis Score: F3  Hepatitis C RNA: 544,000 on 01/03/2021  Patient Active Problem List   Diagnosis Date Noted   Upper airway cough syndrome 01/24/2021   Chronic hepatitis C without hepatic coma ([place]) 01/12/2021   Abnormal mammogram 01/07/2021   Boil 02/29/2020   Eye irritation 06/13/2019   Conjunctivitis 03/15/2019   Left otitis media 06/23/2015   Essential hypertension 04/28/2015   Respiratory tract infection 04/18/2015   Health care maintenance 02/05/2015   Diabetes ([place]) 09/06/2013   Essential hypertension, benign 11/15/2012   Iron deficiency 10/05/2012   Mild intermittent asthma in adult without complication 56/25/6389   HYPERTHYROIDISM, SUBCLINICAL 08/24/2008   Allergic rhinitis 08/24/2008   GERD 08/24/2008    Patient's Medications  New Prescriptions   No medications on file  Previous Medications   ALBUTEROL (VENTOLIN HFA) 108 (90 BASE) MCG/ACT INHALER    Inhale 2 puffs into the lungs every 6 (six) hours as needed for wheezing.   BUDESONIDE-FORMOTEROL (SYMBICORT) 80-4.5 MCG/ACT INHALER    Take 2 puffs first thing in am and then another 2 puffs about 12 hours later.   CALCIUM-MAGNESIUM-VITAMIN D (CALCIUM 500 PO)    Take 1 tablet by mouth daily.   CHOLECALCIFEROL (VITAMIN D3) 1000 UNITS CAPS    Take 1 capsule by mouth daily.   CYANOCOBALAMIN (VITAMIN B 12 PO)    Take 1 tablet by mouth daily.   FAMOTIDINE (PEPCID) 20 MG TABLET    One after supper   FLUTICASONE (FLONASE) 50 MCG/ACT NASAL SPRAY    USE 2 SPRAYS IN EACH NOSTRIL DAILY   GLUCOSE BLOOD TEST STRIP    Use as instructed   LEVOCETIRIZINE (XYZAL) 5 MG TABLET    Take 5 mg by mouth every evening.   LOSARTAN (COZAAR) 50 MG TABLET    TAKE 1 TABLET DAILY   METFORMIN (GLUCOPHAGE) 1000 MG TABLET    TAKE 1 TABLET TWICE A DAY  WITH MEALS   MONTELUKAST (SINGULAIR) 10 MG TABLET    TAKE 1 TABLET DAILY AT BEDTIME   MULTIPLE VITAMIN (MULTIVITAMIN) TABLET    Take 1 tablet by mouth daily.   PANTOPRAZOLE (PROTONIX) 40 MG TABLET    TAKE 1 TABLET DAILY   ROSUVASTATIN (CRESTOR) 5 MG TABLET    Take one tablet per week.   SOFOSBUVIR-VELPATASVIR (EPCLUSA) 400-100 MG TABS    Take 1 tablet by mouth daily. Take 1 tablet by mouth daily.   VITAMIN E PO    Take by mouth daily.  Modified Medications   No medications on file  Discontinued Medications   No medications on file    Allergies: Allergies  Allergen Reactions   Doxycycline Nausea And Vomiting   Oxycodone Nausea And Vomiting   Seasonal Ic [Cholestatin]     Past Medical History: Past Medical History:  Diagnosis Date   Allergy    Asthma    GERD (gastroesophageal reflux disease)    Gestational diabetes    Hypertension     Social History: Social History   Socioeconomic History   Marital status: Married    Spouse name: Not on file   Number of children: 2   Years of education: Not on file   Highest education level: Not on file  Occupational History   Occupation: Retail banker: [employer]  Tobacco Use   Smoking status: Former    Packs/day: 0.25    Years: 5.00    Pack years: 1.25    Types: Cigarettes    Quit date: 09/28/1988    Years since quitting: 32.6   Smokeless tobacco: Never   Tobacco comments:    smoked briefly as a teenager  Vaping Use   Vaping Use: Never used  Substance and Sexual Activity   Alcohol use: Yes    Alcohol/week: 1.0 standard drink    Types: 1 Glasses of wine per week    Comment: Occasional 1-2 per month   Drug use: Never   Sexual activity: Not on file  Other Topics Concern   Not on file  Social History Narrative   Regular exercise-yes, walk   Diet: fast food, trying to improve diet         Social Determinants of Health   Financial Resource Strain: Not on file  Food Insecurity: Not on file   Transportation Needs: Not on file  Physical Activity: Not on file  Stress: Not on file  Social Connections: Not on file    Labs: Hepatitis C Lab Results  Component Value Date   HCVGENOTYPE 1b 01/12/2021   HEPCAB REACTIVE (A) 01/03/2021   HCVRNAPCRQN 544,000 (H) 01/03/2021   FIBROSTAGE F3 01/12/2021   Hepatitis B Lab Results  Component Value Date   HEPBSAB NON-REACTIVE 01/12/2021   HEPBSAG NON-REACTIVE 01/12/2021   Hepatitis A No results found for: HAV HIV Lab Results  Component Value Date   HIV NON-REACTIVE 01/03/2021   Lab Results  Component Value Date   CREATININE 0.86 01/03/2021   CREATININE 0.91 06/27/2020   CREATININE 0.96 02/23/2020   CREATININE 0.93 10/19/2019   CREATININE 0.90 06/05/2019   Lab Results  Component Value Date   AST 34 01/12/2021   AST 33 01/03/2021   AST 29 06/27/2020   ALT 42 (H) 01/12/2021   ALT 42 (H) 01/12/2021   ALT 37 (H) 01/03/2021   INR 0.9 01/12/2021    Assessment: Donna Hensley presents to clinic today for her 39-month follow-up for Epclusa. She reports that she has been taking the medication consistently with crackers and spacing it appropriately from her PPI and H2RA medications. She is doing very well with the medication and has not experienced any side effects. She has been getting Epclusa mailed to her house and already has her third month's supply. We will check HCV RNA and LFTs today. She will follow-up with Marya Amsler for her end-of-therapy visit after completing 12 weeks of therapy.    Plan: -Check HCV RNA and LFTs -Follow-up w/ Marya Amsler on 06/28/21  Donald Pore, PharmD Pharmacy Resident 05/11/2021, 2:52 PM

## 2021-05-12 DIAGNOSIS — J3089 Other allergic rhinitis: Secondary | ICD-10-CM | POA: Diagnosis not present

## 2021-05-13 LAB — HEPATITIS C RNA QUANTITATIVE
HCV Quantitative Log: <1.18 log IU/mL
HCV RNA, PCR, QN: <15 IU/mL

## 2021-05-13 LAB — HEPATIC FUNCTION PANEL
AG Ratio: 1.4 (calc) (ref 1.0–2.5)
ALT: 21 U/L (ref 6–29)
AST: 18 U/L (ref 10–35)
Albumin: 4.2 g/dL (ref 3.6–5.1)
Alkaline phosphatase (APISO): 82 U/L (ref 37–153)
Bilirubin, Direct: 0.1 mg/dL (ref 0.0–0.2)
Globulin: 2.9 g/dL (calc) (ref 1.9–3.7)
Indirect Bilirubin: 0.3 mg/dL (calc) (ref 0.2–1.2)
Total Bilirubin: 0.4 mg/dL (ref 0.2–1.2)
Total Protein: 7.1 g/dL (ref 6.1–8.1)

## 2021-05-18 DIAGNOSIS — J3089 Other allergic rhinitis: Secondary | ICD-10-CM | POA: Diagnosis not present

## 2021-05-26 DIAGNOSIS — J3089 Other allergic rhinitis: Secondary | ICD-10-CM | POA: Diagnosis not present

## 2021-06-09 DIAGNOSIS — J3089 Other allergic rhinitis: Secondary | ICD-10-CM | POA: Diagnosis not present

## 2021-06-14 ENCOUNTER — Other Ambulatory Visit: Payer: Self-pay

## 2021-06-14 ENCOUNTER — Other Ambulatory Visit (INDEPENDENT_AMBULATORY_CARE_PROVIDER_SITE_OTHER): Payer: BC Managed Care – PPO

## 2021-06-14 DIAGNOSIS — R7989 Other specified abnormal findings of blood chemistry: Secondary | ICD-10-CM | POA: Diagnosis not present

## 2021-06-14 DIAGNOSIS — Z13220 Encounter for screening for lipoid disorders: Secondary | ICD-10-CM

## 2021-06-14 DIAGNOSIS — E611 Iron deficiency: Secondary | ICD-10-CM | POA: Diagnosis not present

## 2021-06-14 DIAGNOSIS — I10 Essential (primary) hypertension: Secondary | ICD-10-CM | POA: Diagnosis not present

## 2021-06-14 DIAGNOSIS — E1165 Type 2 diabetes mellitus with hyperglycemia: Secondary | ICD-10-CM | POA: Diagnosis not present

## 2021-06-14 LAB — HEMOGLOBIN A1C: Hgb A1c MFr Bld: 6.1 % (ref 4.6–6.5)

## 2021-06-14 LAB — CBC WITH DIFFERENTIAL/PLATELET
Basophils Absolute: 0.1 10*3/uL (ref 0.0–0.1)
Basophils Relative: 1 % (ref 0.0–3.0)
Eosinophils Absolute: 0.3 10*3/uL (ref 0.0–0.7)
Eosinophils Relative: 3.6 % (ref 0.0–5.0)
HCT: 43.7 % (ref 36.0–46.0)
Hemoglobin: 14.3 g/dL (ref 12.0–15.0)
Lymphocytes Relative: 36.6 % (ref 12.0–46.0)
Lymphs Abs: 2.8 10*3/uL (ref 0.7–4.0)
MCHC: 32.8 g/dL (ref 30.0–36.0)
MCV: 85.9 fl (ref 78.0–100.0)
Monocytes Absolute: 0.6 10*3/uL (ref 0.1–1.0)
Monocytes Relative: 7.5 % (ref 3.0–12.0)
Neutro Abs: 4 10*3/uL (ref 1.4–7.7)
Neutrophils Relative %: 51.3 % (ref 43.0–77.0)
Platelets: 273 10*3/uL (ref 150.0–400.0)
RBC: 5.09 Mil/uL (ref 3.87–5.11)
RDW: 14 % (ref 11.5–15.5)
WBC: 7.8 10*3/uL (ref 4.0–10.5)

## 2021-06-14 LAB — TSH: TSH: 2.14 u[IU]/mL (ref 0.35–5.50)

## 2021-06-15 LAB — LIPID PANEL
Cholesterol: 184 mg/dL (ref 0–200)
HDL: 55.6 mg/dL (ref >39.00)
LDL Cholesterol: 109 mg/dL — ABNORMAL HIGH (ref 0–99)
NonHDL: 128.74
Total CHOL/HDL Ratio: 3
Triglycerides: 99 mg/dL (ref 0.0–149.0)
VLDL: 19.8 mg/dL (ref 0.0–40.0)

## 2021-06-15 LAB — IBC + FERRITIN
Ferritin: 15.2 ng/mL (ref 10.0–291.0)
Iron: 85 ug/dL (ref 42–145)
Saturation Ratios: 19.3 % — ABNORMAL LOW (ref 20.0–50.0)
TIBC: 439.6 ug/dL (ref 250.0–450.0)
Transferrin: 314 mg/dL (ref 212.0–360.0)

## 2021-06-15 LAB — BASIC METABOLIC PANEL
BUN: 13 mg/dL (ref 6–23)
CO2: 24 mEq/L (ref 19–32)
Calcium: 9.6 mg/dL (ref 8.4–10.5)
Chloride: 103 mEq/L (ref 96–112)
Creatinine, Ser: 0.94 mg/dL (ref 0.40–1.20)
GFR: 69.43 mL/min (ref >60.00)
Glucose, Bld: 108 mg/dL — ABNORMAL HIGH (ref 70–99)
Potassium: 3.9 mEq/L (ref 3.5–5.1)
Sodium: 139 mEq/L (ref 135–145)

## 2021-06-15 LAB — HEPATIC FUNCTION PANEL
ALT: 20 U/L (ref 0–35)
AST: 17 U/L (ref 0–37)
Albumin: 4.2 g/dL (ref 3.5–5.2)
Alkaline Phosphatase: 78 U/L (ref 39–117)
Bilirubin, Direct: 0.1 mg/dL (ref 0.0–0.3)
Total Bilirubin: 0.6 mg/dL (ref 0.2–1.2)
Total Protein: 8 g/dL (ref 6.0–8.3)

## 2021-06-16 ENCOUNTER — Ambulatory Visit: Payer: BC Managed Care – PPO | Admitting: Internal Medicine

## 2021-06-16 ENCOUNTER — Telehealth: Payer: Self-pay | Admitting: Internal Medicine

## 2021-06-16 ENCOUNTER — Other Ambulatory Visit: Payer: Self-pay

## 2021-06-16 VITALS — BP 128/72 | HR 90 | Temp 98.2°F | Resp 16 | Ht 62.0 in | Wt 193.0 lb

## 2021-06-16 DIAGNOSIS — K219 Gastro-esophageal reflux disease without esophagitis: Secondary | ICD-10-CM

## 2021-06-16 DIAGNOSIS — R928 Other abnormal and inconclusive findings on diagnostic imaging of breast: Secondary | ICD-10-CM

## 2021-06-16 DIAGNOSIS — B182 Chronic viral hepatitis C: Secondary | ICD-10-CM

## 2021-06-16 DIAGNOSIS — I10 Essential (primary) hypertension: Secondary | ICD-10-CM | POA: Diagnosis not present

## 2021-06-16 DIAGNOSIS — E1165 Type 2 diabetes mellitus with hyperglycemia: Secondary | ICD-10-CM

## 2021-06-16 DIAGNOSIS — Z1231 Encounter for screening mammogram for malignant neoplasm of breast: Secondary | ICD-10-CM | POA: Diagnosis not present

## 2021-06-16 DIAGNOSIS — E611 Iron deficiency: Secondary | ICD-10-CM

## 2021-06-16 DIAGNOSIS — J4520 Mild intermittent asthma, uncomplicated: Secondary | ICD-10-CM

## 2021-06-16 MED ORDER — ROSUVASTATIN CALCIUM 5 MG PO TABS
ORAL_TABLET | ORAL | 1 refills | Status: DC
Start: 2021-06-16 — End: 2021-07-14

## 2021-06-16 MED ORDER — PANTOPRAZOLE SODIUM 40 MG PO TBEC: 40.0000 mg | DELAYED_RELEASE_TABLET | Freq: Every day | ORAL | 1 refills | Status: DC

## 2021-06-16 NOTE — Telephone Encounter (Signed)
Patient scheduled for labs 09/14/21 as requested by check out note.   Needing orders placed.

## 2021-06-16 NOTE — Progress Notes (Signed)
Patient ID: Donna Hensley, female   DOB: 1965-06-03, 56 y.o.   MRN: [id number]   Subjective:    Patient ID: Donna Hensley, female    DOB: [birth year], 56 y.o.   MRN: [id number]  This visit occurred during the SARS-CoV-2 public health emergency.  Safety protocols were in place, including screening questions prior to the visit, additional usage of staff PPE, and extensive cleaning of exam room while observing appropriate contact time as indicated for disinfecting solutions.   Patient here for a scheduled follow up.   Chief Complaint  Patient presents with   Diabetes   Hyperlipidemia   .   HPI Overall doing relatively well.  Increased stress.  Discussed. Does not feel needs any further intervention at this time.  Tries to stay active.  No chest pain reported.  Breathing doing well.  Overall feels better.  No acid reflux reported.  No abdominal pain.  Bowels moving.     Past Medical History:  Diagnosis Date   Allergy    Asthma    GERD (gastroesophageal reflux disease)    Gestational diabetes    Hypertension    Past Surgical History:  Procedure Laterality Date   APPENDECTOMY  1986   BREAST BIOPSY Left 08/26/2020   Affirm bx-"RIBBON" clip path pending   [place]  2013   COLONOSCOPY WITH PROPOFOL N/A 05/05/2018   Procedure: COLONOSCOPY WITH PROPOFOL;  Surgeon: [name], MD;  Location: [facility] ENDOSCOPY;  Service: Endoscopy;  Laterality: N/A;   ESOPHAGOGASTRODUODENOSCOPY (EGD) WITH PROPOFOL N/A 05/05/2018   Procedure: ESOPHAGOGASTRODUODENOSCOPY (EGD) WITH PROPOFOL;  Surgeon: [name], MD;  Location: [facility] ENDOSCOPY;  Service: Endoscopy;  Laterality: N/A;   TONSILLECTOMY  1987   wisdom tooth removal     Family History  Problem Relation Age of Onset   Hyperlipidemia Mother    Osteoporosis Mother    Hypertension Father    Arthritis Father    Sleep apnea Father    Heart disease Father    Sleep apnea Brother    Arthritis  Paternal Grandmother    Diabetes Paternal Grandmother    Breast cancer Neg Hx    Social History   Socioeconomic History   Marital status: Married    Spouse name: Not on file   Number of children: 2   Years of education: Not on file   Highest education level: Not on file  Occupational History   Occupation: csr    Employer: [employer]  Tobacco Use   Smoking status: Former    Packs/day: 0.25    Years: 5.00    Pack years: 1.25    Types: Cigarettes    Quit date: 09/28/1988    Years since quitting: 32.7   Smokeless tobacco: Never   Tobacco comments:    smoked briefly as a teenager  Vaping Use   Vaping Use: Never used  Substance and Sexual Activity   Alcohol use: Yes    Alcohol/week: 1.0 standard drink    Types: 1 Glasses of wine per week    Comment: Occasional 1-2 per month   Drug use: Never   Sexual activity: Not on file  Other Topics Concern   Not on file  Social History Narrative   Regular exercise-yes, walk   Diet: fast food, trying to improve diet         Social Determinants of Health   Financial Resource Strain: Not on file  Food Insecurity: Not on file  Transportation Needs: Not on file  Physical Activity: Not on file  Stress: Not on file  Social Connections: Not on file     Review of Systems  Constitutional:  Negative for appetite change and unexpected weight change.  HENT:  Negative for congestion and sinus pressure.   Respiratory:  Negative for cough, chest tightness and shortness of breath.   Cardiovascular:  Negative for chest pain, palpitations and leg swelling.  Gastrointestinal:  Negative for abdominal pain, diarrhea, nausea and vomiting.  Genitourinary:  Negative for difficulty urinating and dysuria.  Musculoskeletal:  Negative for joint swelling and myalgias.  Skin:  Negative for color change and rash.  Neurological:  Negative for dizziness, light-headedness and headaches.  Psychiatric/Behavioral:  Negative for agitation and  dysphoric mood.       Objective:     BP 128/72    Pulse 90    Temp 98.2 F (36.8 C)    Resp 16    Ht 5' 2"  (1.575 m)    Wt 193 lb (87.5 kg)    LMP 07/17/2017    SpO2 98%    BMI 35.30 kg/m  Wt Readings from Last 3 Encounters:  06/16/21 193 lb (87.5 kg)  03/31/21 192 lb 12.8 oz (87.5 kg)  01/24/21 197 lb 6.4 oz (89.5 kg)    Physical Exam Vitals reviewed.  Constitutional:      General: She is not in acute distress.    Appearance: Normal appearance.  HENT:     Head: Normocephalic and atraumatic.     Right Ear: External ear normal.     Left Ear: External ear normal.  Eyes:     General: No scleral icterus.       Right eye: No discharge.        Left eye: No discharge.     Conjunctiva/sclera: Conjunctivae normal.  Neck:     Thyroid: No thyromegaly.  Cardiovascular:     Rate and Rhythm: Normal rate and regular rhythm.  Pulmonary:     Effort: No respiratory distress.     Breath sounds: Normal breath sounds. No wheezing.  Abdominal:     General: Bowel sounds are normal.     Palpations: Abdomen is soft.     Tenderness: There is no abdominal tenderness.  Musculoskeletal:        General: No swelling or tenderness.     Cervical back: Neck supple. No tenderness.  Lymphadenopathy:     Cervical: No cervical adenopathy.  Skin:    Findings: No erythema or rash.  Neurological:     Mental Status: She is alert.  Psychiatric:        Mood and Affect: Mood normal.        Behavior: Behavior normal.     Outpatient Encounter Medications as of 06/16/2021  Medication Sig   albuterol (VENTOLIN HFA) 108 (90 Base) MCG/ACT inhaler Inhale 2 puffs into the lungs every 6 (six) hours as needed for wheezing.   budesonide-formoterol (SYMBICORT) 80-4.5 MCG/ACT inhaler Take 2 puffs first thing in am and then another 2 puffs about 12 hours later.   Calcium-Magnesium-Vitamin D (CALCIUM 500 PO) Take 1 tablet by mouth daily.   Cholecalciferol (VITAMIN D3) 1000 units CAPS Take 1 capsule by mouth daily.    Cyanocobalamin (VITAMIN B 12 PO) Take 1 tablet by mouth daily.   famotidine (PEPCID) 20 MG tablet One after supper   fluticasone (FLONASE) 50 MCG/ACT nasal spray USE 2 SPRAYS IN EACH NOSTRIL DAILY   glucose blood test strip  Use as instructed   levocetirizine (XYZAL) 5 MG tablet Take 5 mg by mouth every evening.   losartan (COZAAR) 50 MG tablet TAKE 1 TABLET DAILY   metFORMIN (GLUCOPHAGE) 1000 MG tablet TAKE 1 TABLET TWICE A DAY WITH MEALS   montelukast (SINGULAIR) 10 MG tablet TAKE 1 TABLET DAILY AT BEDTIME   Multiple Vitamin (MULTIVITAMIN) tablet Take 1 tablet by mouth daily.   pantoprazole (PROTONIX) 40 MG tablet Take 1 tablet (40 mg total) by mouth daily.   rosuvastatin (CRESTOR) 5 MG tablet Take one tablet three days per week.   VITAMIN E PO Take by mouth daily.   [DISCONTINUED] pantoprazole (PROTONIX) 40 MG tablet TAKE 1 TABLET DAILY   [DISCONTINUED] rosuvastatin (CRESTOR) 5 MG tablet Take one tablet per week. (Patient taking differently: Take 5 mg by mouth every 30 (thirty) days.)   [DISCONTINUED] Sofosbuvir-Velpatasvir (EPCLUSA) 400-100 MG TABS Take 1 tablet by mouth daily. Take 1 tablet by mouth daily.   No facility-administered encounter medications on file as of 06/16/2021.     Lab Results  Component Value Date   WBC 7.8 06/14/2021   HGB 14.3 06/14/2021   HCT 43.7 06/14/2021   PLT 273.0 06/14/2021   GLUCOSE 108 (H) 06/14/2021   CHOL 184 06/14/2021   TRIG 99.0 06/14/2021   HDL 55.60 06/14/2021   LDLCALC 109 (H) 06/14/2021   ALT 20 06/14/2021   AST 17 06/14/2021   NA 139 06/14/2021   K 3.9 06/14/2021   CL 103 06/14/2021   CREATININE 0.94 06/14/2021   BUN 13 06/14/2021   CO2 24 06/14/2021   TSH 2.14 06/14/2021   INR 0.9 01/12/2021   HGBA1C 6.1 06/14/2021   MICROALBUR 2.0 (H) 02/23/2020    US ABDOMEN COMPLETE W/ELASTOGRAPHY  Result Date: 02/10/2021 CLINICAL DATA:  History of hepatitis C, assess for liver fibrosis. EXAM: ULTRASOUND ABDOMEN ULTRASOUND HEPATIC  ELASTOGRAPHY TECHNIQUE: Sonography of the upper abdomen was performed. In addition, ultrasound elastography evaluation of the liver was performed. A region of interest was placed within the right lobe of the liver. Following application of a compressive sonographic pulse, tissue compressibility was assessed. Multiple assessments were performed at the selected site. Median tissue compressibility was determined. Previously, hepatic stiffness was assessed by shear wave velocity. Based on recently published Society of Radiologists in Ultrasound consensus article, reporting is now recommended to be performed in the SI units of pressure (kiloPascals) representing hepatic stiffness/elasticity. The obtained result is compared to the published reference standards. (cACLD = compensated Advanced Chronic Liver Disease) COMPARISON:  None. FINDINGS: ULTRASOUND ABDOMEN Gallbladder: Surgically absent. Common bile duct: Diameter: 5 mm Liver: No focal lesion identified. Increased parenchymal echogenicity. No discrete contour nodularity. Portal vein is patent on color Doppler imaging with normal direction of blood flow towards the liver. IVC: No abnormality visualized. Pancreas: Visualized portion unremarkable. Spleen: Size and appearance within normal limits. Right Kidney: Length: 11.8 cm. Echogenicity within normal limits. No mass or hydronephrosis visualized. Left Kidney: Length: 11.5 cm. Echogenicity within normal limits. No mass or hydronephrosis visualized. Abdominal aorta: No aneurysm visualized. Other findings: None. ULTRASOUND HEPATIC ELASTOGRAPHY Device: Siemens Helix VTQ Patient position: Supine Transducer 5C1 Number of measurements: 10 Hepatic segment:  8 Median kPa: 3.0 IQR: 0.6 IQR/Median kPa ratio: 0.2 Data quality:  Good Diagnostic category:  < or = 5 kPa: high probability of being normal The use of hepatic elastography is applicable to patients with viral hepatitis and non-alcoholic fatty liver disease. At this time,  there is insufficient data for the referenced  cut-off values and use in other causes of liver disease, including alcoholic liver disease. Patients, however, may be assessed by elastography and serve as their own reference standard/baseline. In patients with non-alcoholic liver disease, the values suggesting compensated advanced chronic liver disease (cACLD) may be lower, and patients may need additional testing with elasticity results of 7-9 kPa. Please note that abnormal hepatic elasticity and shear wave velocities may also be identified in clinical settings other than with hepatic fibrosis, such as: acute hepatitis, elevated right heart and central venous pressures including use of beta blockers, veno-occlusive disease (Budd-Chiari), infiltrative processes such as mastocytosis/amyloidosis/infiltrative tumor/lymphoma, extrahepatic cholestasis, with hyperemia in the post-prandial state, and with liver transplantation. Correlation with patient history, laboratory data, and clinical condition recommended. Diagnostic Categories: < or =5 kPa: high probability of being normal < or =9 kPa: in the absence of other known clinical signs, rules out cACLD >9 kPa and ?13 kPa: suggestive of cACLD, but needs further testing >13 kPa: highly suggestive of cACLD > or =17 kPa: highly suggestive of cACLD with an increased probability of clinically significant portal hypertension IMPRESSION: ULTRASOUND ABDOMEN: Diffusely increased parenchymal echogenicity nonspecific but most commonly seen with hepatic steatosis or hepatocellular disease. No overt hepatic lesion visualized. ULTRASOUND HEPATIC ELASTOGRAPHY: Median kPa:  3 Diagnostic category:  < or = 5 kPa: high probability of being normal Electronically Signed   By: Dahlia Bailiff M.D.   On: 02/10/2021 21:15       Assessment & Plan:   Problem List Items Addressed This Visit     Abnormal mammogram    Recent abnormal mammogram.  S/p biopsy.  Benign.  Recommended f/u mammogram in  March 2023.  Mammogram ordered.  She will schedule.       Chronic hepatitis C without hepatic coma (HCC)    Seeing ID.  Completing - eclusa.  Follow liver function tests.        Diabetes ([place])    Low carb diet and exercise.  Follow met b and a1c.       Relevant Medications   rosuvastatin (CRESTOR) 5 MG tablet   Other Relevant Orders   Hemoglobin A1c   Hepatic function panel   Lipid panel   Basic metabolic panel   Microalbumin / creatinine urine ratio   Essential hypertension    Blood pressure doing well.  Continue losartan.  Follow pressures.  Follow metabolic panel.       Relevant Medications   rosuvastatin (CRESTOR) 5 MG tablet   Other Relevant Orders   CBC with Differential/Platelet   GERD    No increased acid reflux.  Protonix.       Relevant Medications   pantoprazole (PROTONIX) 40 MG tablet   Iron deficiency    Follow cbc and iron levels.        Relevant Orders   CBC with Differential/Platelet   Ferritin   Mild intermittent asthma in adult without complication    Breathing stable.  Has seen Dr Melvyn Novas.  Continue symbicort.        Other Visit Diagnoses     Visit for screening mammogram    -  Primary   Relevant Orders   MM 3D SCREEN BREAST BILATERAL        Einar Pheasant, MD

## 2021-06-17 ENCOUNTER — Encounter: Payer: Self-pay | Admitting: Internal Medicine

## 2021-06-17 NOTE — Assessment & Plan Note (Addendum)
Recent abnormal mammogram.  S/p biopsy.  Benign.  Recommended f/u mammogram in March 2023.  Mammogram ordered.  She will schedule.

## 2021-06-17 NOTE — Assessment & Plan Note (Signed)
No increased acid reflux.  Protonix.  

## 2021-06-17 NOTE — Assessment & Plan Note (Signed)
Blood pressure doing well.  Continue losartan.  Follow pressures.  Follow metabolic panel.

## 2021-06-17 NOTE — Assessment & Plan Note (Signed)
Seeing ID.  Completing - eclusa.  Follow liver function tests.

## 2021-06-17 NOTE — Assessment & Plan Note (Signed)
Follow cbc and iron levels.   

## 2021-06-17 NOTE — Assessment & Plan Note (Signed)
Low carb diet and exercise.  Follow met b and a1c.

## 2021-06-17 NOTE — Assessment & Plan Note (Signed)
Breathing stable.  Has seen Dr Melvyn Novas.  Continue symbicort.

## 2021-06-20 DIAGNOSIS — J3089 Other allergic rhinitis: Secondary | ICD-10-CM | POA: Diagnosis not present

## 2021-06-28 ENCOUNTER — Ambulatory Visit: Payer: BC Managed Care – PPO | Admitting: Family

## 2021-06-29 DIAGNOSIS — J3089 Other allergic rhinitis: Secondary | ICD-10-CM | POA: Diagnosis not present

## 2021-07-07 DIAGNOSIS — J3089 Other allergic rhinitis: Secondary | ICD-10-CM | POA: Diagnosis not present

## 2021-07-12 ENCOUNTER — Encounter: Payer: Self-pay | Admitting: Internal Medicine

## 2021-07-12 NOTE — Telephone Encounter (Signed)
See if she would be agreeable to come in and let me recheck her blood pressure , etc.  I can see her 10:30 on Friday 07/14/21 ?

## 2021-07-12 NOTE — Telephone Encounter (Signed)
Pt scheduled  

## 2021-07-14 ENCOUNTER — Other Ambulatory Visit: Payer: Self-pay

## 2021-07-14 ENCOUNTER — Ambulatory Visit: Payer: BC Managed Care – PPO | Admitting: Internal Medicine

## 2021-07-14 DIAGNOSIS — J4520 Mild intermittent asthma, uncomplicated: Secondary | ICD-10-CM

## 2021-07-14 DIAGNOSIS — J309 Allergic rhinitis, unspecified: Secondary | ICD-10-CM | POA: Diagnosis not present

## 2021-07-14 DIAGNOSIS — E1165 Type 2 diabetes mellitus with hyperglycemia: Secondary | ICD-10-CM

## 2021-07-14 DIAGNOSIS — B182 Chronic viral hepatitis C: Secondary | ICD-10-CM

## 2021-07-14 DIAGNOSIS — K219 Gastro-esophageal reflux disease without esophagitis: Secondary | ICD-10-CM

## 2021-07-14 DIAGNOSIS — I10 Essential (primary) hypertension: Secondary | ICD-10-CM

## 2021-07-14 DIAGNOSIS — E7800 Pure hypercholesterolemia, unspecified: Secondary | ICD-10-CM

## 2021-07-14 MED ORDER — ROSUVASTATIN CALCIUM 5 MG PO TABS: 5.0000 mg | ORAL_TABLET | Freq: Every day | ORAL | 0 refills | Status: DC

## 2021-07-14 MED ORDER — ROSUVASTATIN CALCIUM 5 MG PO TABS: 5.0000 mg | ORAL_TABLET | Freq: Every day | ORAL | 1 refills | Status: DC

## 2021-07-14 MED ORDER — LOSARTAN POTASSIUM 100 MG PO TABS: 100.0000 mg | ORAL_TABLET | Freq: Every day | ORAL | 1 refills | Status: DC

## 2021-07-14 NOTE — Progress Notes (Signed)
Patient ID: Donna Hensley, female   DOB: 11/24/1965, 56 y.o.   MRN: [id number] ? ? ?Subjective:  ? ? Patient ID: Donna Hensley, female    DOB: [birth year], 56 y.o.   MRN: [id number] ? ?This visit occurred during the SARS-CoV-2 public health emergency.  Safety protocols were in place, including screening questions prior to the visit, additional usage of staff PPE, and extensive cleaning of exam room while observing appropriate contact time as indicated for disinfecting solutions.  ? ?Patient here for work in appt.  ? ?Chief Complaint  ?Patient presents with  ? Hypertension  ? .  ? ?HPI ?Work in to discuss her blood pressure.  Reviewed outside blood pressure - average.  Discussed adjusting medication.  Tolerating.  Still has some issues with allergies.  On a good regimen.  Discussed increasing astelin to bid.  No chest congestion or sob.  No chest pain.  No increased cough.  No acid reflux.  No abdominal pain or cramping.  Bowels moving.  Tolerating crestor three days per week.  ? ? ?Past Medical History:  ?Diagnosis Date  ? Allergy   ? Asthma   ? GERD (gastroesophageal reflux disease)   ? Gestational diabetes   ? Hypertension   ? ?Past Surgical History:  ?Procedure Laterality Date  ? APPENDECTOMY  1986  ? BREAST BIOPSY Left 08/26/2020  ? Affirm bx-"RIBBON" clip path pending  ? [place]  ? CHOLECYSTECTOMY  2013  ? COLONOSCOPY WITH PROPOFOL N/A 05/05/2018  ? Procedure: COLONOSCOPY WITH PROPOFOL;  Surgeon: [name], MD;  Location: [facility] ENDOSCOPY;  Service: Endoscopy;  Laterality: N/A;  ? ESOPHAGOGASTRODUODENOSCOPY (EGD) WITH PROPOFOL N/A 05/05/2018  ? Procedure: ESOPHAGOGASTRODUODENOSCOPY (EGD) WITH PROPOFOL;  Surgeon: [name], MD;  Location: [facility] ENDOSCOPY;  Service: Endoscopy;  Laterality: N/A;  ? TONSILLECTOMY  1987  ? wisdom tooth removal    ? ?Family History  ?Problem Relation Age of Onset  ? Hyperlipidemia Mother   ? Osteoporosis Mother   ? Hypertension Father   ?  Arthritis Father   ? Sleep apnea Father   ? Heart disease Father   ? Sleep apnea Brother   ? Arthritis Paternal Grandmother   ? Diabetes Paternal Grandmother   ? Breast cancer Neg Hx   ? ?Social History  ? ?Socioeconomic History  ? Marital status: Married  ?  Spouse name: Not on file  ? Number of children: 2  ? Years of education: Not on file  ? Highest education level: Not on file  ?Occupational History  ? Occupation: csr  ?  Employer: [employer]  ?Tobacco Use  ? Smoking status: Former  ?  Packs/day: 0.25  ?  Years: 5.00  ?  Pack years: 1.25  ?  Types: Cigarettes  ?  Quit date: 09/28/1988  ?  Years since quitting: 32.8  ? Smokeless tobacco: Never  ? Tobacco comments:  ?  smoked briefly as a teenager  ?Vaping Use  ? Vaping Use: Never used  ?Substance and Sexual Activity  ? Alcohol use: Yes  ?  Alcohol/week: 1.0 standard drink  ?  Types: 1 Glasses of wine per week  ?  Comment: Occasional 1-2 per month  ? Drug use: Never  ? Sexual activity: Not on file  ?Other Topics Concern  ? Not on file  ?Social History Narrative  ? Regular exercise-yes, walk  ? Diet: fast food, trying to improve diet  ?   ?   ? ?  Social Determinants of Health  ? ?Financial Resource Strain: Not on file  ?Food Insecurity: Not on file  ?Transportation Needs: Not on file  ?Physical Activity: Not on file  ?Stress: Not on file  ?Social Connections: Not on file  ? ? ? ?Review of Systems  ?Constitutional:  Negative for appetite change and unexpected weight change.  ?HENT:  Negative for congestion and sinus pressure.   ?     Watery eyes.  Drainage.   ?Respiratory:  Negative for cough, chest tightness and shortness of breath.   ?Cardiovascular:  Negative for chest pain, palpitations and leg swelling.  ?Gastrointestinal:  Negative for abdominal pain, diarrhea, nausea and vomiting.  ?Genitourinary:  Negative for difficulty urinating and dysuria.  ?Musculoskeletal:  Negative for joint swelling and myalgias.  ?Skin:  Negative for color change and  rash.  ?Neurological:  Negative for dizziness, light-headedness and headaches.  ?Psychiatric/Behavioral:  Negative for agitation and dysphoric mood.   ? ?   ?Objective:  ?  ? ?BP 138/78   Pulse 82   Temp 98 ?F (36.7 ?C)   Resp 16   Ht '5\' 2"'$  (1.575 m)   Wt 198 lb (89.8 kg)   LMP 07/17/2017   SpO2 98%   BMI 36.21 kg/m?  ?Wt Readings from Last 3 Encounters:  ?07/14/21 198 lb (89.8 kg)  ?06/16/21 193 lb (87.5 kg)  ?03/31/21 192 lb 12.8 oz (87.5 kg)  ? ? ?Physical Exam ?Vitals reviewed.  ?Constitutional:   ?   General: She is not in acute distress. ?   Appearance: Normal appearance.  ?HENT:  ?   Head: Normocephalic and atraumatic.  ?   Right Ear: External ear normal.  ?   Left Ear: External ear normal.  ?Eyes:  ?   General: No scleral icterus.    ?   Right eye: No discharge.     ?   Left eye: No discharge.  ?   Conjunctiva/sclera: Conjunctivae normal.  ?Neck:  ?   Thyroid: No thyromegaly.  ?Cardiovascular:  ?   Rate and Rhythm: Normal rate and regular rhythm.  ?Pulmonary:  ?   Effort: No respiratory distress.  ?   Breath sounds: Normal breath sounds. No wheezing.  ?Abdominal:  ?   General: Bowel sounds are normal.  ?   Palpations: Abdomen is soft.  ?   Tenderness: There is no abdominal tenderness.  ?Musculoskeletal:     ?   General: No swelling or tenderness.  ?   Cervical back: Neck supple. No tenderness.  ?Lymphadenopathy:  ?   Cervical: No cervical adenopathy.  ?Skin: ?   Findings: No erythema or rash.  ?Neurological:  ?   Mental Status: She is alert.  ?Psychiatric:     ?   Mood and Affect: Mood normal.     ?   Behavior: Behavior normal.  ? ? ? ?Outpatient Encounter Medications as of 07/14/2021  ?Medication Sig  ? EPINEPHrine (EPIPEN 2-PAK) 0.3 mg/0.3 mL IJ SOAJ injection Inject 0.3 mg into the muscle as needed for anaphylaxis.  ? losartan (COZAAR) 100 MG tablet Take 1 tablet (100 mg total) by mouth daily.  ? rosuvastatin (CRESTOR) 5 MG tablet Take 1 tablet (5 mg total) by mouth daily.  ? albuterol (VENTOLIN  HFA) 108 (90 Base) MCG/ACT inhaler Inhale 2 puffs into the lungs every 6 (six) hours as needed for wheezing.  ? budesonide-formoterol (SYMBICORT) 80-4.5 MCG/ACT inhaler Take 2 puffs first thing in am and then another 2 puffs about 12  hours later.  ? Calcium-Magnesium-Vitamin D (CALCIUM 500 PO) Take 1 tablet by mouth daily.  ? Cholecalciferol (VITAMIN D3) 1000 units CAPS Take 1 capsule by mouth daily.  ? Cyanocobalamin (VITAMIN B 12 PO) Take 1 tablet by mouth daily.  ? famotidine (PEPCID) 20 MG tablet One after supper  ? fluticasone (FLONASE) 50 MCG/ACT nasal spray USE 2 SPRAYS IN EACH NOSTRIL DAILY  ? glucose blood test strip Use as instructed  ? levocetirizine (XYZAL) 5 MG tablet Take 5 mg by mouth every evening.  ? metFORMIN (GLUCOPHAGE) 1000 MG tablet TAKE 1 TABLET TWICE A DAY WITH MEALS  ? montelukast (SINGULAIR) 10 MG tablet TAKE 1 TABLET DAILY AT BEDTIME  ? Multiple Vitamin (MULTIVITAMIN) tablet Take 1 tablet by mouth daily.  ? pantoprazole (PROTONIX) 40 MG tablet Take 1 tablet (40 mg total) by mouth daily.  ? rosuvastatin (CRESTOR) 5 MG tablet Take 1 tablet (5 mg total) by mouth daily.  ? VITAMIN E PO Take by mouth daily.  ? [DISCONTINUED] losartan (COZAAR) 50 MG tablet TAKE 1 TABLET DAILY  ? [DISCONTINUED] rosuvastatin (CRESTOR) 5 MG tablet Take one tablet three days per week.  ? ?No facility-administered encounter medications on file as of 07/14/2021.  ?  ? ?Lab Results  ?Component Value Date  ? WBC 7.8 06/14/2021  ? HGB 14.3 06/14/2021  ? HCT 43.7 06/14/2021  ? PLT 273.0 06/14/2021  ? GLUCOSE 108 (H) 06/14/2021  ? CHOL 184 06/14/2021  ? TRIG 99.0 06/14/2021  ? HDL 55.60 06/14/2021  ? LDLCALC 109 (H) 06/14/2021  ? ALT 20 06/14/2021  ? AST 17 06/14/2021  ? NA 139 06/14/2021  ? K 3.9 06/14/2021  ? CL 103 06/14/2021  ? CREATININE 0.94 06/14/2021  ? BUN 13 06/14/2021  ? CO2 24 06/14/2021  ? TSH 2.14 06/14/2021  ? INR 0.9 01/12/2021  ? HGBA1C 6.1 06/14/2021  ? MICROALBUR 2.0 (H) 02/23/2020  ? ? ?US ABDOMEN  COMPLETE W/ELASTOGRAPHY ? ?Result Date: 02/10/2021 ?CLINICAL DATA:  History of hepatitis C, assess for liver fibrosis. EXAM: ULTRASOUND ABDOMEN ULTRASOUND HEPATIC ELASTOGRAPHY TECHNIQUE: Sonography of the upper abdomen was per

## 2021-07-15 ENCOUNTER — Encounter: Payer: Self-pay | Admitting: Internal Medicine

## 2021-07-15 DIAGNOSIS — E7800 Pure hypercholesterolemia, unspecified: Secondary | ICD-10-CM | POA: Insufficient documentation

## 2021-07-15 MED ORDER — EPINEPHRINE 0.3 MG/0.3ML IJ SOAJ: 0.3000 mg | INTRAMUSCULAR | 0 refills | Status: DC | PRN

## 2021-07-15 NOTE — Assessment & Plan Note (Signed)
Seeing ID.  Treated with eclusa.  Follow liver function tests.   ?

## 2021-07-15 NOTE — Assessment & Plan Note (Signed)
Sees an allergist.  With nasal drainage despite her current medication regimen.  Increase astelin to bid.  Follow.  Ophthalmology to evaluate eyes.  ?

## 2021-07-15 NOTE — Assessment & Plan Note (Signed)
Blood pressure as outlined.  Increase losartan to '100mg'$  q day.  Follow pressures.  Follow metabolic panel. ?

## 2021-07-15 NOTE — Assessment & Plan Note (Signed)
Continue symbicort.  Breathing stable.  

## 2021-07-15 NOTE — Assessment & Plan Note (Signed)
Tolerating crestor '5mg'$  three days per week.  Increase to daily.  Follow lipid panel and liver function tests.   ?

## 2021-07-15 NOTE — Assessment & Plan Note (Signed)
No increased acid reflux.  Protonix.  

## 2021-07-15 NOTE — Assessment & Plan Note (Signed)
Low carb diet and exercise.  Sugar as outlined. Weight down.  Follow met b and a1c.  ?

## 2021-07-17 ENCOUNTER — Ambulatory Visit: Payer: BC Managed Care – PPO | Admitting: Family

## 2021-07-25 DIAGNOSIS — J3089 Other allergic rhinitis: Secondary | ICD-10-CM | POA: Diagnosis not present

## 2021-08-03 DIAGNOSIS — J3089 Other allergic rhinitis: Secondary | ICD-10-CM | POA: Diagnosis not present

## 2021-08-09 ENCOUNTER — Other Ambulatory Visit: Payer: Self-pay | Admitting: Internal Medicine

## 2021-08-10 DIAGNOSIS — J3089 Other allergic rhinitis: Secondary | ICD-10-CM | POA: Diagnosis not present

## 2021-08-14 ENCOUNTER — Other Ambulatory Visit: Payer: Self-pay

## 2021-08-14 ENCOUNTER — Encounter: Payer: Self-pay | Admitting: Family

## 2021-08-14 ENCOUNTER — Ambulatory Visit: Payer: BC Managed Care – PPO | Admitting: Family

## 2021-08-14 VITALS — BP 136/87 | HR 93 | Temp 98.1°F | Wt 197.0 lb

## 2021-08-14 DIAGNOSIS — Z79899 Other long term (current) drug therapy: Secondary | ICD-10-CM | POA: Diagnosis not present

## 2021-08-14 DIAGNOSIS — B182 Chronic viral hepatitis C: Secondary | ICD-10-CM | POA: Diagnosis not present

## 2021-08-14 NOTE — Assessment & Plan Note (Signed)
Ms. Favaro completed 12 weeks of Epclusa around the end of February. Check Hepatitis C RNA today. Will need to repeat for SVR12 in about 1 month to confirm cure. Appears to have low risk for fibrosis and will not need any further Gabriana Wilmott screening. Follow up in 1 month for lab visit. Additional treatment or follow up as needed pending lab work results.  ?

## 2021-08-14 NOTE — Patient Instructions (Signed)
Nice to see you. ? ?We will check your lab work today. ? ?Follow up with a lab visit in 1 month to recheck blood work. ? ?If no viral load present (<15) no additional treatment will be needed.  ? ?Have a great day and stay safe! ? ?

## 2021-08-14 NOTE — Progress Notes (Signed)
? ?Subjective:  ? ? Patient ID: Donna Hensley, female    DOB: May 17, 1965, 56 y.o.   MRN: [id number] ? ?Chief Complaint  ?Patient presents with  ? Hepatitis C  ? ? ?HPI: ? ?Donna Hensley is a 56 y.o. female with chronic hepatitis C last seen on 05/11/21 by Alfonse Spruce, PharmD, CPP with good adherence and tolerance to her antiviral therapy with Epclusa. Hepatitis C RNA level was undetectable. Here today for follow up. ? ?Donna Hensley completed her Raeanne Gathers as prescribed with good adherence and tolerance around the last week in February. Has been doing well since with no new concerns/complaints. Denies abdominal pain, nausea, vomiting, fatigue, scleral icterus, or jaundice.  ? ? ?Allergies  ?Allergen Reactions  ? Doxycycline Nausea And Vomiting  ? Oxycodone Nausea And Vomiting  ? Seasonal Ic [Cholestatin]   ? ? ? ? ?Outpatient Medications Prior to Visit  ?Medication Sig Dispense Refill  ? albuterol (VENTOLIN HFA) 108 (90 Base) MCG/ACT inhaler Inhale 2 puffs into the lungs every 6 (six) hours as needed for wheezing. 6.7 g 2  ? budesonide-formoterol (SYMBICORT) 80-4.5 MCG/ACT inhaler Take 2 puffs first thing in am and then another 2 puffs about 12 hours later. 3 each 3  ? Calcium-Magnesium-Vitamin D (CALCIUM 500 PO) Take 1 tablet by mouth daily.    ? Cholecalciferol (VITAMIN D3) 1000 units CAPS Take 1 capsule by mouth daily.    ? Cyanocobalamin (VITAMIN B 12 PO) Take 1 tablet by mouth daily.    ? EPINEPHrine (EPIPEN 2-PAK) 0.3 mg/0.3 mL IJ SOAJ injection Inject 0.3 mg into the muscle as needed for anaphylaxis. 2 each 0  ? famotidine (PEPCID) 20 MG tablet One after supper  0  ? fluticasone (FLONASE) 50 MCG/ACT nasal spray USE 2 SPRAYS IN EACH NOSTRIL DAILY 48 g 3  ? glucose blood test strip Use as instructed 200 each 3  ? levocetirizine (XYZAL) 5 MG tablet Take 5 mg by mouth every evening.  3  ? losartan (COZAAR) 100 MG tablet Take 1 tablet (100 mg total) by mouth daily. 90 tablet 1  ? metFORMIN (GLUCOPHAGE) 1000  MG tablet TAKE 1 TABLET TWICE A DAY WITH MEALS 180 tablet 3  ? montelukast (SINGULAIR) 10 MG tablet TAKE 1 TABLET DAILY AT BEDTIME 90 tablet 3  ? Multiple Vitamin (MULTIVITAMIN) tablet Take 1 tablet by mouth daily.    ? pantoprazole (PROTONIX) 40 MG tablet Take 1 tablet (40 mg total) by mouth daily. 90 tablet 1  ? rosuvastatin (CRESTOR) 5 MG tablet Take 1 tablet (5 mg total) by mouth daily. 90 tablet 1  ? rosuvastatin (CRESTOR) 5 MG tablet Take 1 tablet (5 mg total) by mouth daily. 30 tablet 0  ? VITAMIN E PO Take by mouth daily.    ? ?No facility-administered medications prior to visit.  ? ? ? ?Past Medical History:  ?Diagnosis Date  ? Allergy   ? Asthma   ? GERD (gastroesophageal reflux disease)   ? Gestational diabetes   ? Hypertension   ? ? ? ?Past Surgical History:  ?Procedure Laterality Date  ? APPENDECTOMY  1986  ? BREAST BIOPSY Left 08/26/2020  ? Affirm bx-"RIBBON" clip path pending  ? [place]  ? CHOLECYSTECTOMY  2013  ? COLONOSCOPY WITH PROPOFOL N/A 05/05/2018  ? Procedure: COLONOSCOPY WITH PROPOFOL;  Surgeon: [name], MD;  Location: [facility] ENDOSCOPY;  Service: Endoscopy;  Laterality: N/A;  ? ESOPHAGOGASTRODUODENOSCOPY (EGD) WITH PROPOFOL N/A 05/05/2018  ? Procedure:  ESOPHAGOGASTRODUODENOSCOPY (EGD) WITH PROPOFOL;  Surgeon: [name], MD;  Location: [facility] ENDOSCOPY;  Service: Endoscopy;  Laterality: N/A;  ? TONSILLECTOMY  1987  ? wisdom tooth removal    ? ? ? ? ? ?Review of Systems  ?Constitutional:  Negative for chills, fatigue, fever and unexpected weight change.  ?Respiratory:  Negative for cough, chest tightness, shortness of breath and wheezing.   ?Cardiovascular:  Negative for chest pain and leg swelling.  ?Gastrointestinal:  Negative for abdominal distention, constipation, diarrhea, nausea and vomiting.  ?Neurological:  Negative for dizziness, weakness, light-headedness and headaches.  ?Hematological:  Does not bruise/bleed easily.  ?   ?Objective:  ?  ?BP 136/87    Pulse 93   Temp 98.1 ?F (36.7 ?C) (Temporal)   Wt 197 lb (89.4 kg)   LMP 07/17/2017   SpO2 98%   BMI 36.03 kg/m?  ?Nursing note and vital signs reviewed. ? ?Physical Exam ?Constitutional:   ?   General: She is not in acute distress. ?   Appearance: She is well-developed.  ?Cardiovascular:  ?   Rate and Rhythm: Normal rate and regular rhythm.  ?   Heart sounds: Normal heart sounds. No murmur heard. ?  No friction rub. No gallop.  ?Pulmonary:  ?   Effort: Pulmonary effort is normal. No respiratory distress.  ?   Breath sounds: Normal breath sounds. No wheezing or rales.  ?Chest:  ?   Chest wall: No tenderness.  ?Abdominal:  ?   General: Bowel sounds are normal. There is no distension.  ?   Palpations: Abdomen is soft. There is no mass.  ?   Tenderness: There is no abdominal tenderness. There is no guarding or rebound.  ?Skin: ?   General: Skin is warm and dry.  ?Neurological:  ?   Mental Status: She is alert and oriented to person, place, and time.  ?Psychiatric:     ?   Behavior: Behavior normal.     ?   Thought Content: Thought content normal.     ?   Judgment: Judgment normal.  ? ? ? ? ?  03/31/2021  ?  2:14 PM 01/12/2021  ?  1:55 PM 02/25/2020  ? 10:16 AM 02/02/2019  ?  2:13 PM 05/28/2016  ?  8:53 AM  ?Depression screen PHQ 2/9  ?Decreased Interest 0 0 0 0 0  ?Down, Depressed, Hopeless 0 0 0 0 0  ?PHQ - 2 Score 0 0 0 0 0  ?Altered sleeping    1   ?Tired, decreased energy    0   ?Change in appetite    0   ?Feeling bad or failure about yourself     0   ?Trouble concentrating    0   ?Moving slowly or fidgety/restless    0   ?Suicidal thoughts    0   ?PHQ-9 Score    1   ?Difficult doing work/chores    Not difficult at all   ?  ?   ?Assessment & Plan:  ? ? ?Patient Active Problem List  ? Diagnosis Date Noted  ? Hypercholesterolemia 07/15/2021  ? Upper airway cough syndrome 01/24/2021  ? Chronic hepatitis C without hepatic coma ([place]) 01/12/2021  ? Abnormal mammogram 01/07/2021  ? Boil 02/29/2020  ? Eye irritation  06/13/2019  ? Conjunctivitis 03/15/2019  ? Left otitis media 06/23/2015  ? Essential hypertension 04/28/2015  ? Respiratory tract infection 04/18/2015  ? Health care maintenance 02/05/2015  ? Diabetes ([place]) 09/06/2013  ? Essential  hypertension, benign 11/15/2012  ? Iron deficiency 10/05/2012  ? Mild intermittent asthma in adult without complication 34/28/7681  ? HYPERTHYROIDISM, SUBCLINICAL 08/24/2008  ? Allergic rhinitis 08/24/2008  ? GERD 08/24/2008  ? ? ? ?Problem List Items Addressed This Visit   ? ?  ? Digestive  ? Chronic hepatitis C without hepatic coma (HCC) - Primary  ?  Donna Hensley completed 12 weeks of Epclusa around the end of February. Check Hepatitis C RNA today. Will need to repeat for SVR12 in about 1 month to confirm cure. Appears to have low risk for fibrosis and will not need any further [place] screening. Follow up in 1 month for lab visit. Additional treatment or follow up as needed pending lab work results.  ? ?  ?  ? Relevant Orders  ? Hepatitis C RNA quantitative  ? Hepatitis C RNA quantitative  ? ? ? ?I am having Donna Hensley "Chrissy" maintain her multivitamin, Cyanocobalamin (VITAMIN B 12 PO), Calcium-Magnesium-Vitamin D (CALCIUM 500 PO), Vitamin D3, levocetirizine, fluticasone, glucose blood, VITAMIN E PO, albuterol, metFORMIN, famotidine, budesonide-formoterol, pantoprazole, losartan, rosuvastatin, rosuvastatin, EPINEPHrine, and montelukast. ? ? ? ?Follow-up: Return in about 1 month (around 09/13/2021), or if symptoms worsen or fail to improve. ? ? ?Terri Piedra, MSN, FNP-C ?Nurse Practitioner ?[facility] ?[facility] ?[facility] Main number: [phone] ? ? ?

## 2021-08-16 LAB — HEPATITIS C RNA QUANTITATIVE
HCV Quantitative Log: <1.18 log IU/mL
HCV RNA, PCR, QN: <15 IU/mL

## 2021-08-17 DIAGNOSIS — J3089 Other allergic rhinitis: Secondary | ICD-10-CM | POA: Diagnosis not present

## 2021-08-25 DIAGNOSIS — J3089 Other allergic rhinitis: Secondary | ICD-10-CM | POA: Diagnosis not present

## 2021-08-31 DIAGNOSIS — J3089 Other allergic rhinitis: Secondary | ICD-10-CM | POA: Diagnosis not present

## 2021-09-06 ENCOUNTER — Other Ambulatory Visit (INDEPENDENT_AMBULATORY_CARE_PROVIDER_SITE_OTHER): Payer: BC Managed Care – PPO

## 2021-09-06 DIAGNOSIS — E611 Iron deficiency: Secondary | ICD-10-CM

## 2021-09-06 DIAGNOSIS — E1165 Type 2 diabetes mellitus with hyperglycemia: Secondary | ICD-10-CM

## 2021-09-06 DIAGNOSIS — B182 Chronic viral hepatitis C: Secondary | ICD-10-CM

## 2021-09-06 DIAGNOSIS — I10 Essential (primary) hypertension: Secondary | ICD-10-CM

## 2021-09-06 LAB — HEPATIC FUNCTION PANEL
ALT: 21 U/L (ref 0–35)
AST: 18 U/L (ref 0–37)
Albumin: 4.4 g/dL (ref 3.5–5.2)
Alkaline Phosphatase: 80 U/L (ref 39–117)
Bilirubin, Direct: 0.2 mg/dL (ref 0.0–0.3)
Total Bilirubin: 0.8 mg/dL (ref 0.2–1.2)
Total Protein: 7.5 g/dL (ref 6.0–8.3)

## 2021-09-06 LAB — LIPID PANEL
Cholesterol: 136 mg/dL (ref 0–200)
HDL: 66 mg/dL (ref >39.00)
LDL Cholesterol: 60 mg/dL (ref 0–99)
NonHDL: 70.38
Total CHOL/HDL Ratio: 2
Triglycerides: 53 mg/dL (ref 0.0–149.0)
VLDL: 10.6 mg/dL (ref 0.0–40.0)

## 2021-09-06 LAB — BASIC METABOLIC PANEL
BUN: 14 mg/dL (ref 6–23)
CO2: 25 mEq/L (ref 19–32)
Calcium: 9.6 mg/dL (ref 8.4–10.5)
Chloride: 104 mEq/L (ref 96–112)
Creatinine, Ser: 1.01 mg/dL (ref 0.40–1.20)
GFR: 63.6 mL/min (ref >60.00)
Glucose, Bld: 106 mg/dL — ABNORMAL HIGH (ref 70–99)
Potassium: 4.4 mEq/L (ref 3.5–5.1)
Sodium: 137 mEq/L (ref 135–145)

## 2021-09-06 LAB — CBC WITH DIFFERENTIAL/PLATELET
Basophils Absolute: 0.1 10*3/uL (ref 0.0–0.1)
Basophils Relative: 0.7 % (ref 0.0–3.0)
Eosinophils Absolute: 0.2 10*3/uL (ref 0.0–0.7)
Eosinophils Relative: 3.2 % (ref 0.0–5.0)
HCT: 42.4 % (ref 36.0–46.0)
Hemoglobin: 14.3 g/dL (ref 12.0–15.0)
Lymphocytes Relative: 36.8 % (ref 12.0–46.0)
Lymphs Abs: 2.8 10*3/uL (ref 0.7–4.0)
MCHC: 33.8 g/dL (ref 30.0–36.0)
MCV: 84.7 fl (ref 78.0–100.0)
Monocytes Absolute: 0.5 10*3/uL (ref 0.1–1.0)
Monocytes Relative: 6.6 % (ref 3.0–12.0)
Neutro Abs: 4 10*3/uL (ref 1.4–7.7)
Neutrophils Relative %: 52.7 % (ref 43.0–77.0)
Platelets: 261 10*3/uL (ref 150.0–400.0)
RBC: 5 Mil/uL (ref 3.87–5.11)
RDW: 14.5 % (ref 11.5–15.5)
WBC: 7.7 10*3/uL (ref 4.0–10.5)

## 2021-09-06 LAB — FERRITIN: Ferritin: 12.7 ng/mL (ref 10.0–291.0)

## 2021-09-06 LAB — HEMOGLOBIN A1C: Hgb A1c MFr Bld: 6 % (ref 4.6–6.5)

## 2021-09-06 LAB — MICROALBUMIN / CREATININE URINE RATIO
Creatinine,U: 116.6 mg/dL
Microalb Creat Ratio: 1.4 mg/g (ref 0.0–30.0)
Microalb, Ur: 1.6 mg/dL (ref 0.0–1.9)

## 2021-09-07 DIAGNOSIS — J3089 Other allergic rhinitis: Secondary | ICD-10-CM | POA: Diagnosis not present

## 2021-09-11 LAB — HEPATITIS C RNA QUANTITATIVE
HCV Quantitative Log: <1.18 log IU/mL
HCV RNA, PCR, QN: <15 IU/mL

## 2021-09-13 ENCOUNTER — Other Ambulatory Visit: Payer: BC Managed Care – PPO

## 2021-09-14 ENCOUNTER — Other Ambulatory Visit: Payer: BC Managed Care – PPO

## 2021-09-18 ENCOUNTER — Encounter: Payer: Self-pay | Admitting: Internal Medicine

## 2021-09-18 ENCOUNTER — Ambulatory Visit: Payer: BC Managed Care – PPO | Admitting: Internal Medicine

## 2021-09-18 VITALS — BP 120/78 | HR 67 | Temp 98.2°F | Resp 14 | Ht 62.0 in | Wt 195.2 lb

## 2021-09-18 DIAGNOSIS — E1165 Type 2 diabetes mellitus with hyperglycemia: Secondary | ICD-10-CM

## 2021-09-18 DIAGNOSIS — B182 Chronic viral hepatitis C: Secondary | ICD-10-CM | POA: Diagnosis not present

## 2021-09-18 DIAGNOSIS — I10 Essential (primary) hypertension: Secondary | ICD-10-CM | POA: Diagnosis not present

## 2021-09-18 DIAGNOSIS — E611 Iron deficiency: Secondary | ICD-10-CM

## 2021-09-18 DIAGNOSIS — E7800 Pure hypercholesterolemia, unspecified: Secondary | ICD-10-CM | POA: Diagnosis not present

## 2021-09-18 DIAGNOSIS — J4520 Mild intermittent asthma, uncomplicated: Secondary | ICD-10-CM

## 2021-09-18 DIAGNOSIS — K219 Gastro-esophageal reflux disease without esophagitis: Secondary | ICD-10-CM

## 2021-09-18 LAB — HM DIABETES EYE EXAM

## 2021-09-18 LAB — HM DIABETES FOOT EXAM

## 2021-09-18 NOTE — Progress Notes (Signed)
Patient ID: ELEXIUS MINAR, female   DOB: 09-14-1965, 56 y.o.   MRN: [id number]   Subjective:    Patient ID: [name], female    DOB: 26-Apr-1966, 56 y.o.   MRN: [id number]   Patient here for a scheduled follow up.  [name]   HPI Here to follow up regarding her diabetes, asthma and cholesterol.  Overall doing relatively well.  Cleaning out her aunt's house and getting it ready for her son to rent.  Daughter is expecting.  Feels she is handling things relatively well.  No chest pain or sob.  No increased cough or congestion.  No acid reflux reported.  Bowels ok.     Past Medical History:  Diagnosis Date   Allergy    Asthma    GERD (gastroesophageal reflux disease)    Gestational diabetes    Hypertension    Past Surgical History:  Procedure Laterality Date   APPENDECTOMY  1986   BREAST BIOPSY Left 08/26/2020   Affirm bx-"RIBBON" clip path pending   [place]  2013   COLONOSCOPY WITH PROPOFOL N/A 05/05/2018   Procedure: COLONOSCOPY WITH PROPOFOL;  Surgeon: [name], MD;  Location: [facility] ENDOSCOPY;  Service: Endoscopy;  Laterality: N/A;   ESOPHAGOGASTRODUODENOSCOPY (EGD) WITH PROPOFOL N/A 05/05/2018   Procedure: ESOPHAGOGASTRODUODENOSCOPY (EGD) WITH PROPOFOL;  Surgeon: [name], MD;  Location: [facility] ENDOSCOPY;  Service: Endoscopy;  Laterality: N/A;   TONSILLECTOMY  1987   wisdom tooth removal     Family History  Problem Relation Age of Onset   Hyperlipidemia Mother    Osteoporosis Mother    Hypertension Father    Arthritis Father    Sleep apnea Father    Heart disease Father    Sleep apnea Brother    Arthritis Paternal Grandmother    Diabetes Paternal Grandmother    Breast cancer Neg Hx    Social History   Socioeconomic History   Marital status: Married    Spouse name: Not on file   Number of children: 2   Years of education: Not on file   Highest education level: Not on file  Occupational History    Occupation: csr    Employer: [employer]  Tobacco Use   Smoking status: Former    Packs/day: 0.25    Years: 5.00    Pack years: 1.25    Types: Cigarettes    Quit date: 09/28/1988    Years since quitting: 33.0   Smokeless tobacco: Never   Tobacco comments:    smoked briefly as a teenager  Vaping Use   Vaping Use: Never used  Substance and Sexual Activity   Alcohol use: Yes    Alcohol/week: 1.0 standard drink    Types: 1 Glasses of wine per week    Comment: Occasional 1-2 per month   Drug use: Never   Sexual activity: Not on file  Other Topics Concern   Not on file  Social History Narrative   Regular exercise-yes, walk   Diet: fast food, trying to improve diet         Social Determinants of Health   Financial Resource Strain: Not on file  Food Insecurity: Not on file  Transportation Needs: Not on file  Physical Activity: Not on file  Stress: Not on file  Social Connections: Not on file     Review of Systems  Constitutional:  Negative for appetite change and unexpected weight change.  HENT:  Negative for  congestion and sinus pressure.   Respiratory:  Negative for cough, chest tightness and shortness of breath.   Cardiovascular:  Negative for chest pain, palpitations and leg swelling.  Gastrointestinal:  Negative for abdominal pain, diarrhea, nausea and vomiting.  Genitourinary:  Negative for difficulty urinating and dysuria.  Musculoskeletal:  Negative for joint swelling and myalgias.  Skin:  Negative for color change and rash.  Neurological:  Negative for dizziness, light-headedness and headaches.  Psychiatric/Behavioral:  Negative for agitation and dysphoric mood.       Objective:     BP 120/78 (BP Location: Left Arm, Patient Position: Sitting, Cuff Size: Large)   Pulse 67   Temp 98.2 F (36.8 C) (Temporal)   Resp 14   Ht _0  (1.575 m)   Wt 195 lb 3.2 oz (88.5 kg)   LMP 07/17/2017   SpO2 99%   BMI 35.70 kg/m  Wt Readings from Last 3  Encounters:  09/18/21 195 lb 3.2 oz (88.5 kg)  08/14/21 197 lb (89.4 kg)  07/14/21 198 lb (89.8 kg)    Physical Exam Vitals reviewed.  Constitutional:      General: She is not in acute distress.    Appearance: Normal appearance.  HENT:     Head: Normocephalic and atraumatic.     Right Ear: External ear normal.     Left Ear: External ear normal.  Eyes:     General: No scleral icterus.       Right eye: No discharge.        Left eye: No discharge.     Conjunctiva/sclera: Conjunctivae normal.  Neck:     Thyroid: No thyromegaly.  Cardiovascular:     Rate and Rhythm: Normal rate and regular rhythm.  Pulmonary:     Effort: No respiratory distress.     Breath sounds: Normal breath sounds. No wheezing.  Abdominal:     General: Bowel sounds are normal.     Palpations: Abdomen is soft.     Tenderness: There is no abdominal tenderness.  Musculoskeletal:        General: No swelling or tenderness.     Cervical back: Neck supple. No tenderness.  Lymphadenopathy:     Cervical: No cervical adenopathy.  Skin:    Findings: No erythema or rash.  Neurological:     Mental Status: She is alert.  Psychiatric:        Mood and Affect: Mood normal.        Behavior: Behavior normal.     Outpatient Encounter Medications as of 09/18/2021  Medication Sig   albuterol (VENTOLIN HFA) 108 (90 Base) MCG/ACT inhaler Inhale 2 puffs into the lungs every 6 (six) hours as needed for wheezing.   budesonide-formoterol (SYMBICORT) 80-4.5 MCG/ACT inhaler Take 2 puffs first thing in am and then another 2 puffs about 12 hours later.   Calcium-Magnesium-Vitamin D (CALCIUM 500 PO) Take 1 tablet by mouth daily.   Cholecalciferol (VITAMIN D3) 1000 units CAPS Take 1 capsule by mouth daily.   Cyanocobalamin (VITAMIN B 12 PO) Take 1 tablet by mouth daily.   EPINEPHrine (EPIPEN 2-PAK) 0.3 mg/0.3 mL IJ SOAJ injection Inject 0.3 mg into the muscle as needed for anaphylaxis.   famotidine (PEPCID) 20 MG tablet One after  supper   fluticasone (FLONASE) 50 MCG/ACT nasal spray USE 2 SPRAYS IN EACH NOSTRIL DAILY   glucose blood test strip Use as instructed   levocetirizine (XYZAL) 5 MG tablet Take 5 mg by mouth every evening.   losartan (COZAAR)  100 MG tablet Take 1 tablet (100 mg total) by mouth daily.   metFORMIN (GLUCOPHAGE) 1000 MG tablet TAKE 1 TABLET TWICE A DAY WITH MEALS   montelukast (SINGULAIR) 10 MG tablet TAKE 1 TABLET DAILY AT BEDTIME   Multiple Vitamin (MULTIVITAMIN) tablet Take 1 tablet by mouth daily.   pantoprazole (PROTONIX) 40 MG tablet Take 1 tablet (40 mg total) by mouth daily.   rosuvastatin (CRESTOR) 5 MG tablet Take 1 tablet (5 mg total) by mouth daily.   VITAMIN E PO Take by mouth daily.   [DISCONTINUED] rosuvastatin (CRESTOR) 5 MG tablet Take 1 tablet (5 mg total) by mouth daily.   No facility-administered encounter medications on file as of 09/18/2021.     Lab Results  Component Value Date   WBC 7.7 09/06/2021   HGB 14.3 09/06/2021   HCT 42.4 09/06/2021   PLT 261.0 09/06/2021   GLUCOSE 106 (H) 09/06/2021   CHOL 136 09/06/2021   TRIG 53.0 09/06/2021   HDL 66.00 09/06/2021   LDLCALC 60 09/06/2021   ALT 21 09/06/2021   AST 18 09/06/2021   NA 137 09/06/2021   K 4.4 09/06/2021   CL 104 09/06/2021   CREATININE 1.01 09/06/2021   BUN 14 09/06/2021   CO2 25 09/06/2021   TSH 2.14 06/14/2021   INR 0.9 01/12/2021   HGBA1C 6.0 09/06/2021   MICROALBUR 1.6 09/06/2021    US ABDOMEN COMPLETE W/ELASTOGRAPHY  Result Date: 02/10/2021 CLINICAL DATA:  History of hepatitis C, assess for liver fibrosis. EXAM: ULTRASOUND ABDOMEN ULTRASOUND HEPATIC ELASTOGRAPHY TECHNIQUE: Sonography of the upper abdomen was performed. In addition, ultrasound elastography evaluation of the liver was performed. A region of interest was placed within the right lobe of the liver. Following application of a compressive sonographic pulse, tissue compressibility was assessed. Multiple assessments were performed at  the selected site. Median tissue compressibility was determined. Previously, hepatic stiffness was assessed by shear wave velocity. Based on recently published Society of Radiologists in Ultrasound consensus article, reporting is now recommended to be performed in the SI units of pressure (kiloPascals) representing hepatic stiffness/elasticity. The obtained result is compared to the published reference standards. (cACLD = compensated Advanced Chronic Liver Disease) COMPARISON:  None. FINDINGS: ULTRASOUND ABDOMEN Gallbladder: Surgically absent. Common bile duct: Diameter: 5 mm Liver: No focal lesion identified. Increased parenchymal echogenicity. No discrete contour nodularity. Portal vein is patent on color Doppler imaging with normal direction of blood flow towards the liver. IVC: No abnormality visualized. Pancreas: Visualized portion unremarkable. Spleen: Size and appearance within normal limits. Right Kidney: Length: 11.8 cm. Echogenicity within normal limits. No mass or hydronephrosis visualized. Left Kidney: Length: 11.5 cm. Echogenicity within normal limits. No mass or hydronephrosis visualized. Abdominal aorta: No aneurysm visualized. Other findings: None. ULTRASOUND HEPATIC ELASTOGRAPHY Device: Siemens Helix VTQ Patient position: Supine Transducer 5C1 Number of measurements: 10 Hepatic segment:  8 Median kPa: 3.0 IQR: 0.6 IQR/Median kPa ratio: 0.2 Data quality:  Good Diagnostic category:  < or = 5 kPa: high probability of being normal The use of hepatic elastography is applicable to patients with viral hepatitis and non-alcoholic fatty liver disease. At this time, there is insufficient data for the referenced cut-off values and use in other causes of liver disease, including alcoholic liver disease. Patients, however, may be assessed by elastography and serve as their own reference standard/baseline. In patients with non-alcoholic liver disease, the values suggesting compensated advanced chronic liver  disease (cACLD) may be lower, and patients may need additional testing with elasticity results of  7-9 kPa. Please note that abnormal hepatic elasticity and shear wave velocities may also be identified in clinical settings other than with hepatic fibrosis, such as: acute hepatitis, elevated right heart and central venous pressures including use of beta blockers, veno-occlusive disease (Budd-Chiari), infiltrative processes such as mastocytosis/amyloidosis/infiltrative tumor/lymphoma, extrahepatic cholestasis, with hyperemia in the post-prandial state, and with liver transplantation. Correlation with patient history, laboratory data, and clinical condition recommended. Diagnostic Categories: < or =5 kPa: high probability of being normal < or =9 kPa: in the absence of other known clinical signs, rules out cACLD >9 kPa and ?13 kPa: suggestive of cACLD, but needs further testing >13 kPa: highly suggestive of cACLD > or =17 kPa: highly suggestive of cACLD with an increased probability of clinically significant portal hypertension IMPRESSION: ULTRASOUND ABDOMEN: Diffusely increased parenchymal echogenicity nonspecific but most commonly seen with hepatic steatosis or hepatocellular disease. No overt hepatic lesion visualized. ULTRASOUND HEPATIC ELASTOGRAPHY: Median kPa:  3 Diagnostic category:  < or = 5 kPa: high probability of being normal Electronically Signed   By: Dahlia Bailiff M.D.   On: 02/10/2021 21:15       Assessment & Plan:   Problem List Items Addressed This Visit     Chronic hepatitis C without hepatic coma ([place])    Completed 12 weeks of Epclusa.         Relevant Orders   Hepatic function panel   Diabetes ([place])    Low carb diet and exercise.  Follow met b and a1c. Had eye exam - 01/2021 - [facility].  Request records.        Relevant Orders   HgB A1c   Essential hypertension - Primary    On losartan.  Blood pressure doing well.  Follow pressures.  Follow metabolic panel         Relevant Orders   Basic Metabolic Panel (BMET)   GERD    No increased acid reflux.  Protonix.        Hypercholesterolemia    On crestor.  Low cholesterol diet and exercise.  Follow lipid panel and liver function tests.         Relevant Orders   Lipid Profile   Iron deficiency    Follow cbc and iron studies.        Relevant Orders   CBC w/Diff   Ferritin   Mild intermittent asthma in adult without complication    Continue symbicort.  Breathing stable.          Einar Pheasant, MD

## 2021-09-21 ENCOUNTER — Other Ambulatory Visit: Payer: BC Managed Care – PPO

## 2021-09-21 ENCOUNTER — Other Ambulatory Visit: Payer: Self-pay

## 2021-09-21 DIAGNOSIS — Z79899 Other long term (current) drug therapy: Secondary | ICD-10-CM | POA: Diagnosis not present

## 2021-09-21 DIAGNOSIS — B182 Chronic viral hepatitis C: Secondary | ICD-10-CM

## 2021-09-22 DIAGNOSIS — J3089 Other allergic rhinitis: Secondary | ICD-10-CM | POA: Diagnosis not present

## 2021-09-25 ENCOUNTER — Encounter: Payer: Self-pay | Admitting: Internal Medicine

## 2021-09-25 LAB — HEPATITIS C RNA QUANTITATIVE
HCV Quantitative Log: <1.18 log IU/mL
HCV RNA, PCR, QN: <15 IU/mL

## 2021-09-25 NOTE — Assessment & Plan Note (Signed)
On losartan.  Blood pressure doing well.  Follow pressures.  Follow metabolic panel

## 2021-09-25 NOTE — Assessment & Plan Note (Signed)
No increased acid reflux.  Protonix.  

## 2021-09-25 NOTE — Assessment & Plan Note (Addendum)
Low carb diet and exercise.  Follow met b and a1c. Had eye exam - 01/2021 - [facility].  Request records.

## 2021-09-25 NOTE — Assessment & Plan Note (Signed)
Follow cbc and iron studies.  

## 2021-09-25 NOTE — Assessment & Plan Note (Signed)
Continue symbicort.  Breathing stable.  

## 2021-09-25 NOTE — Assessment & Plan Note (Signed)
Completed 12 weeks of Epclusa.   

## 2021-09-25 NOTE — Assessment & Plan Note (Signed)
On crestor.  Low cholesterol diet and exercise.  Follow lipid panel and liver function tests.   

## 2021-09-29 DIAGNOSIS — J3089 Other allergic rhinitis: Secondary | ICD-10-CM | POA: Diagnosis not present

## 2021-10-06 DIAGNOSIS — J3089 Other allergic rhinitis: Secondary | ICD-10-CM | POA: Diagnosis not present

## 2021-10-06 DIAGNOSIS — J3081 Allergic rhinitis due to animal (cat) (dog) hair and dander: Secondary | ICD-10-CM | POA: Diagnosis not present

## 2021-10-06 DIAGNOSIS — J301 Allergic rhinitis due to pollen: Secondary | ICD-10-CM | POA: Diagnosis not present

## 2021-10-12 DIAGNOSIS — J3089 Other allergic rhinitis: Secondary | ICD-10-CM | POA: Diagnosis not present

## 2021-10-26 DIAGNOSIS — J3089 Other allergic rhinitis: Secondary | ICD-10-CM | POA: Diagnosis not present

## 2021-11-07 ENCOUNTER — Other Ambulatory Visit: Payer: Self-pay | Admitting: Internal Medicine

## 2021-11-07 DIAGNOSIS — J3089 Other allergic rhinitis: Secondary | ICD-10-CM | POA: Diagnosis not present

## 2021-11-17 DIAGNOSIS — J3089 Other allergic rhinitis: Secondary | ICD-10-CM | POA: Diagnosis not present

## 2021-11-27 ENCOUNTER — Other Ambulatory Visit: Payer: Self-pay | Admitting: Internal Medicine

## 2021-12-07 DIAGNOSIS — J3089 Other allergic rhinitis: Secondary | ICD-10-CM | POA: Diagnosis not present

## 2021-12-12 DIAGNOSIS — J3089 Other allergic rhinitis: Secondary | ICD-10-CM | POA: Diagnosis not present

## 2021-12-13 IMAGING — MG MM DIGITAL DIAGNOSTIC UNILAT*L* W/ TOMO W/ CAD
4 series · 4 of 12 positions shown · non-contrast
Comparison: Previous exam(s).

CLINICAL DATA: Patient recalled from screening for left breast
mass.

EXAM:
DIGITAL DIAGNOSTIC UNILATERAL LEFT MAMMOGRAM WITH TOMOSYNTHESIS AND
CAD; ULTRASOUND LEFT BREAST LIMITED
TECHNIQUE: Left digital diagnostic mammography and breast tomosynthesis was
performed. The images were evaluated with computer-aided detection.;
Targeted ultrasound examination of the left breast was performed

[L CC synth-2D]
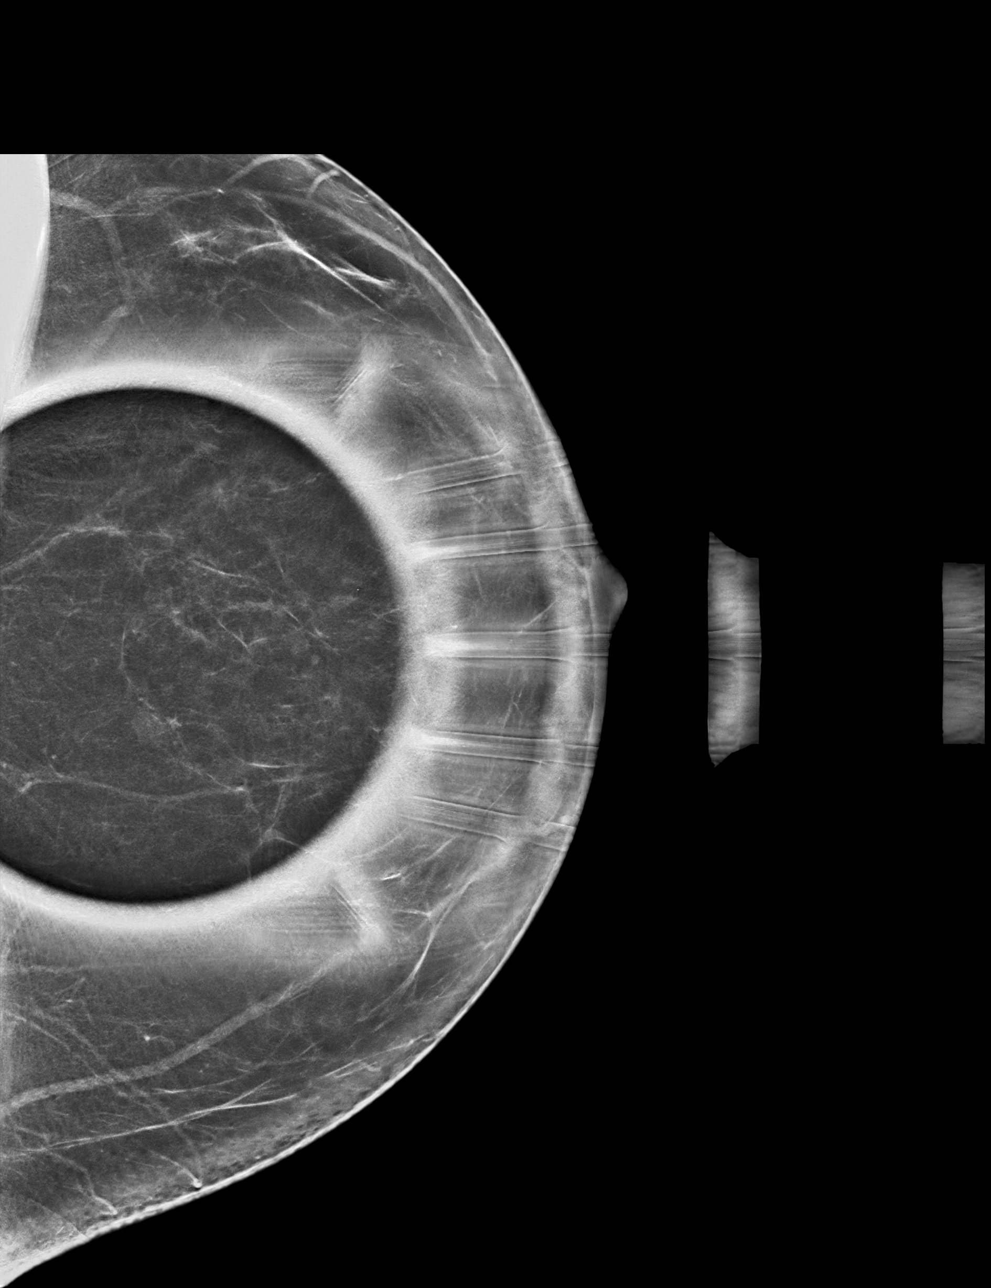

[L MLO synth-2D]
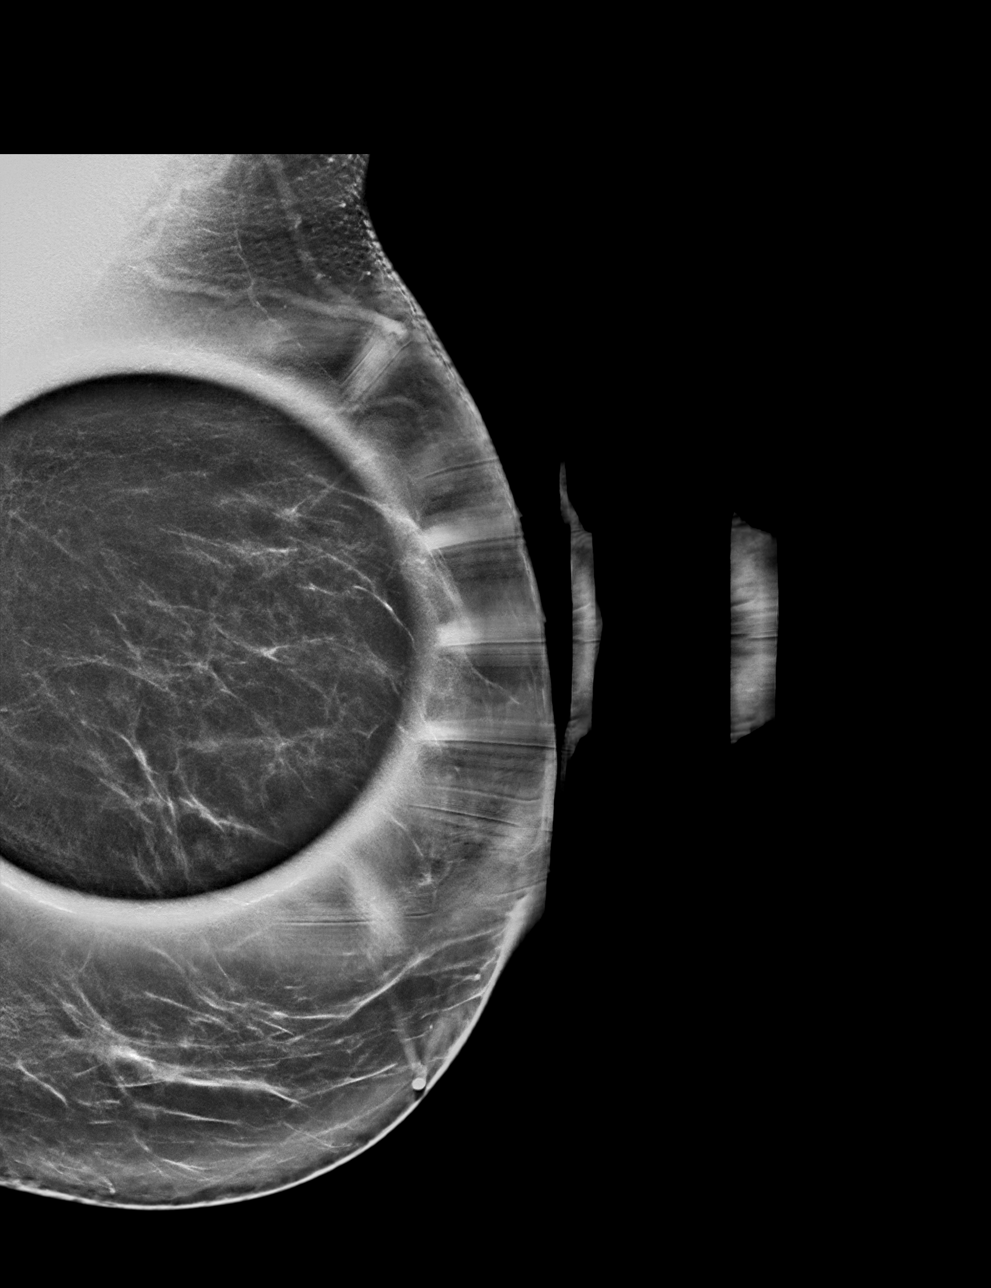

[L MLO tomo · tomo slice 41/82.0]
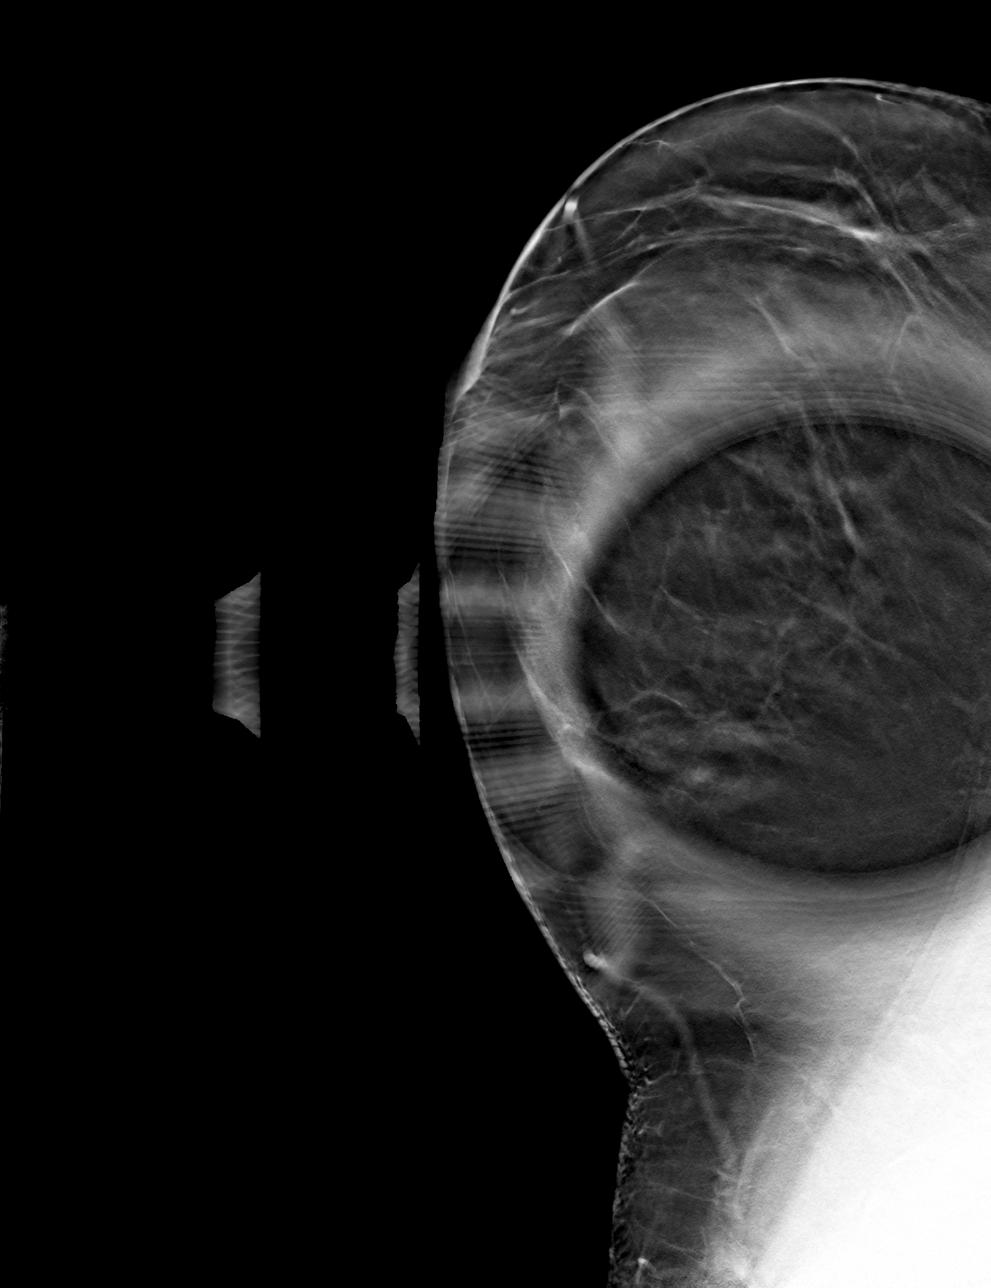

[L CC tomo · tomo slice 35/68.0]
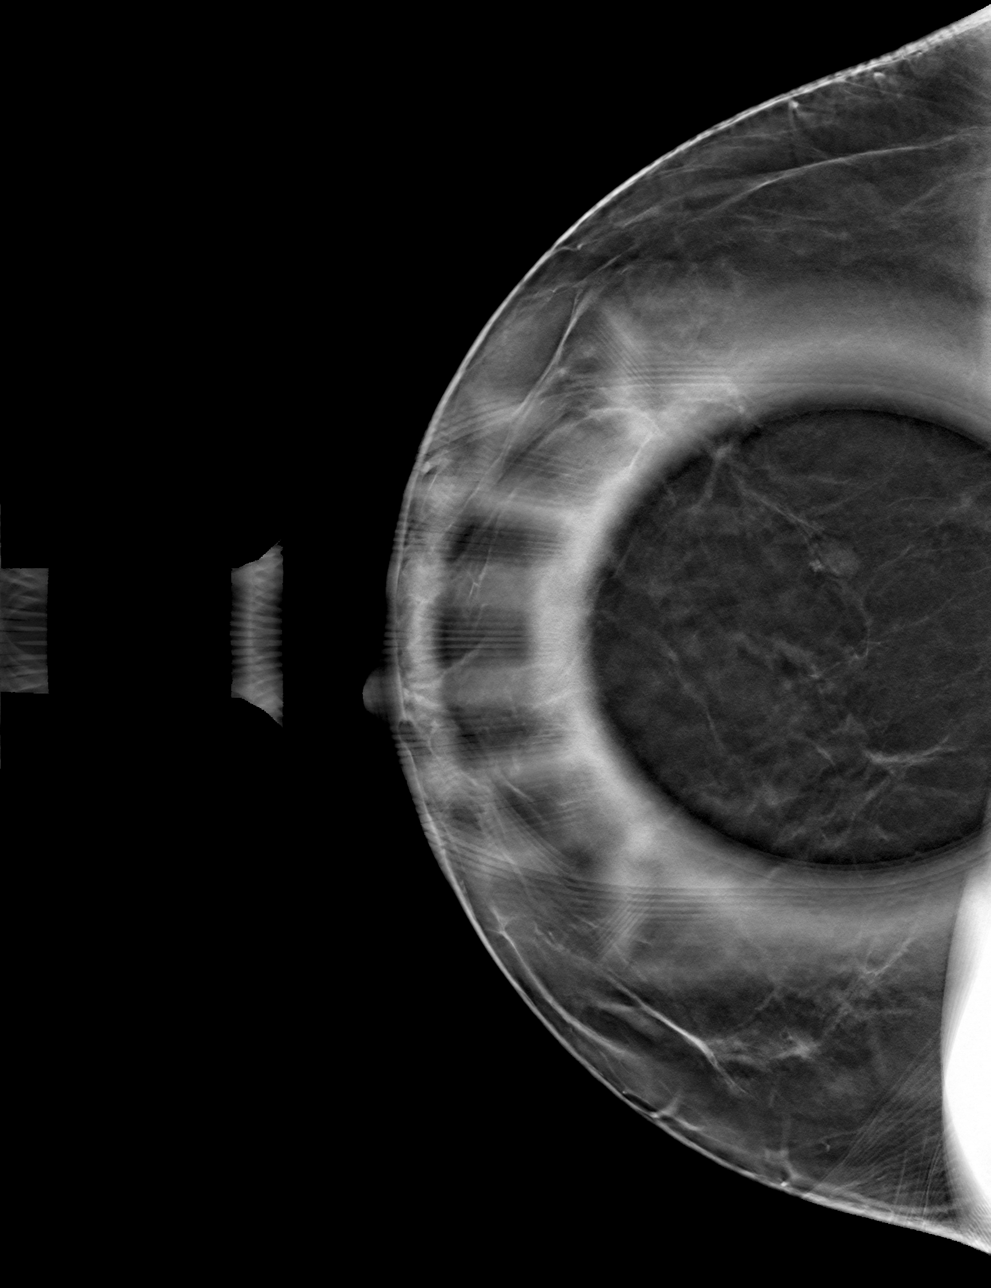

[4 of 12 positions shown; findings below may reference images not displayed]

ACR Breast Density Category b: There are scattered areas of
fibroglandular density.
FINDINGS: There is a persistent small low-density oval circumscribed mass
within the left breast middle depth.

Targeted ultrasound is performed, showing no definite sonographic
correlate for the small mass within the superior slightly medial
left breast demonstrated best on the spot compression cc view. No
left axillary adenopathy.
IMPRESSION: Indeterminate sonographically occult left breast mass.

RECOMMENDATION:
Stereotactic guided core needle biopsy left breast mass.

I have discussed the findings and recommendations with the patient.
If applicable, a reminder letter will be sent to the patient
regarding the next appointment.

BI-RADS CATEGORY  4: Suspicious.

## 2021-12-13 IMAGING — US US BREAST*L* LIMITED INC AXILLA
1 series · 1 of 1 positions shown · non-contrast
Comparison: Previous exam(s).

CLINICAL DATA: Patient recalled from screening for left breast
mass.

EXAM:
DIGITAL DIAGNOSTIC UNILATERAL LEFT MAMMOGRAM WITH TOMOSYNTHESIS AND
CAD; ULTRASOUND LEFT BREAST LIMITED
TECHNIQUE: Left digital diagnostic mammography and breast tomosynthesis was
performed. The images were evaluated with computer-aided detection.;
Targeted ultrasound examination of the left breast was performed

[Series 1: us breast*left* limited inc axilla · 0.07mm/px · 1 of 1 slices shown]
[im 1/1]
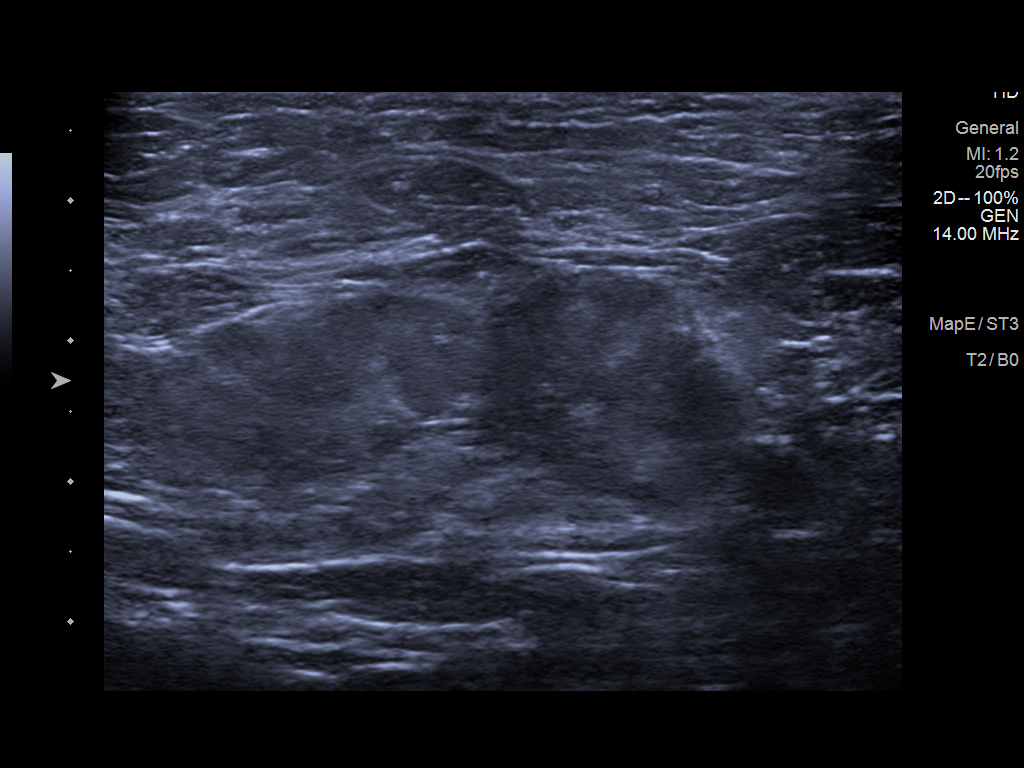

[1 of 1 positions shown; findings below may reference images not displayed]

ACR Breast Density Category b: There are scattered areas of
fibroglandular density.
FINDINGS: There is a persistent small low-density oval circumscribed mass
within the left breast middle depth.

Targeted ultrasound is performed, showing no definite sonographic
correlate for the small mass within the superior slightly medial
left breast demonstrated best on the spot compression cc view. No
left axillary adenopathy.
IMPRESSION: Indeterminate sonographically occult left breast mass.

RECOMMENDATION:
Stereotactic guided core needle biopsy left breast mass.

I have discussed the findings and recommendations with the patient.
If applicable, a reminder letter will be sent to the patient
regarding the next appointment.

BI-RADS CATEGORY  4: Suspicious.

## 2021-12-21 DIAGNOSIS — J3089 Other allergic rhinitis: Secondary | ICD-10-CM | POA: Diagnosis not present

## 2021-12-25 ENCOUNTER — Other Ambulatory Visit: Payer: Self-pay | Admitting: Internal Medicine

## 2021-12-27 ENCOUNTER — Ambulatory Visit
Admission: RE | Admit: 2021-12-27 | Discharge: 2021-12-27 | Disposition: A | Discharge status: Home / Self Care | Payer: BC Managed Care – PPO | Source: Ambulatory Visit | Attending: Internal Medicine | Admitting: Internal Medicine

## 2021-12-27 DIAGNOSIS — Z1231 Encounter for screening mammogram for malignant neoplasm of breast: Secondary | ICD-10-CM | POA: Diagnosis not present

## 2021-12-28 DIAGNOSIS — J3089 Other allergic rhinitis: Secondary | ICD-10-CM | POA: Diagnosis not present

## 2021-12-29 ENCOUNTER — Telehealth: Payer: Self-pay

## 2021-12-29 ENCOUNTER — Other Ambulatory Visit: Payer: Self-pay | Admitting: Internal Medicine

## 2021-12-29 DIAGNOSIS — R928 Other abnormal and inconclusive findings on diagnostic imaging of breast: Secondary | ICD-10-CM

## 2021-12-29 DIAGNOSIS — J3089 Other allergic rhinitis: Secondary | ICD-10-CM | POA: Diagnosis not present

## 2021-12-29 NOTE — Progress Notes (Signed)
Order placed for f/u left breast mammogram and ultrasound.   

## 2021-12-29 NOTE — Telephone Encounter (Signed)
LMTCB for mammogram results.  

## 2021-12-29 NOTE — Telephone Encounter (Signed)
Patient returned office phone call for mammogram results.

## 2021-12-29 NOTE — Telephone Encounter (Signed)
Pt called see result note.

## 2022-01-02 DIAGNOSIS — J3089 Other allergic rhinitis: Secondary | ICD-10-CM | POA: Diagnosis not present

## 2022-01-11 DIAGNOSIS — J3089 Other allergic rhinitis: Secondary | ICD-10-CM | POA: Diagnosis not present

## 2022-01-11 DIAGNOSIS — J301 Allergic rhinitis due to pollen: Secondary | ICD-10-CM | POA: Diagnosis not present

## 2022-01-11 DIAGNOSIS — J3081 Allergic rhinitis due to animal (cat) (dog) hair and dander: Secondary | ICD-10-CM | POA: Diagnosis not present

## 2022-01-16 ENCOUNTER — Other Ambulatory Visit (INDEPENDENT_AMBULATORY_CARE_PROVIDER_SITE_OTHER): Payer: BC Managed Care – PPO

## 2022-01-16 DIAGNOSIS — B182 Chronic viral hepatitis C: Secondary | ICD-10-CM | POA: Diagnosis not present

## 2022-01-16 DIAGNOSIS — E7800 Pure hypercholesterolemia, unspecified: Secondary | ICD-10-CM

## 2022-01-16 DIAGNOSIS — E1165 Type 2 diabetes mellitus with hyperglycemia: Secondary | ICD-10-CM

## 2022-01-16 DIAGNOSIS — E611 Iron deficiency: Secondary | ICD-10-CM | POA: Diagnosis not present

## 2022-01-16 DIAGNOSIS — I10 Essential (primary) hypertension: Secondary | ICD-10-CM

## 2022-01-16 LAB — HEPATIC FUNCTION PANEL
ALT: 19 U/L (ref 0–35)
AST: 16 U/L (ref 0–37)
Albumin: 4.2 g/dL (ref 3.5–5.2)
Alkaline Phosphatase: 76 U/L (ref 39–117)
Bilirubin, Direct: 0.2 mg/dL (ref 0.0–0.3)
Total Bilirubin: 0.7 mg/dL (ref 0.2–1.2)
Total Protein: 7 g/dL (ref 6.0–8.3)

## 2022-01-16 LAB — CBC WITH DIFFERENTIAL/PLATELET
Basophils Absolute: 0 10*3/uL (ref 0.0–0.1)
Basophils Relative: 0.5 % (ref 0.0–3.0)
Eosinophils Absolute: 0.3 10*3/uL (ref 0.0–0.7)
Eosinophils Relative: 3.8 % (ref 0.0–5.0)
HCT: 41.2 % (ref 36.0–46.0)
Hemoglobin: 14.1 g/dL (ref 12.0–15.0)
Lymphocytes Relative: 36.2 % (ref 12.0–46.0)
Lymphs Abs: 2.9 10*3/uL (ref 0.7–4.0)
MCHC: 34.1 g/dL (ref 30.0–36.0)
MCV: 85.2 fl (ref 78.0–100.0)
Monocytes Absolute: 0.6 10*3/uL (ref 0.1–1.0)
Monocytes Relative: 6.9 % (ref 3.0–12.0)
Neutro Abs: 4.3 10*3/uL (ref 1.4–7.7)
Neutrophils Relative %: 52.6 % (ref 43.0–77.0)
Platelets: 241 10*3/uL (ref 150.0–400.0)
RBC: 4.84 Mil/uL (ref 3.87–5.11)
RDW: 14.4 % (ref 11.5–15.5)
WBC: 8.1 10*3/uL (ref 4.0–10.5)

## 2022-01-16 LAB — LIPID PANEL
Cholesterol: 126 mg/dL (ref 0–200)
HDL: 61.1 mg/dL (ref >39.00)
LDL Cholesterol: 50 mg/dL (ref 0–99)
NonHDL: 65.32
Total CHOL/HDL Ratio: 2
Triglycerides: 78 mg/dL (ref 0.0–149.0)
VLDL: 15.6 mg/dL (ref 0.0–40.0)

## 2022-01-16 LAB — BASIC METABOLIC PANEL
BUN: 14 mg/dL (ref 6–23)
CO2: 25 mEq/L (ref 19–32)
Calcium: 9.7 mg/dL (ref 8.4–10.5)
Chloride: 102 mEq/L (ref 96–112)
Creatinine, Ser: 0.9 mg/dL (ref 0.40–1.20)
GFR: 72.85 mL/min (ref >60.00)
Glucose, Bld: 104 mg/dL — ABNORMAL HIGH (ref 70–99)
Potassium: 3.8 mEq/L (ref 3.5–5.1)
Sodium: 138 mEq/L (ref 135–145)

## 2022-01-16 LAB — FERRITIN: Ferritin: 11.6 ng/mL (ref 10.0–291.0)

## 2022-01-16 LAB — HEMOGLOBIN A1C: Hgb A1c MFr Bld: 6.1 % (ref 4.6–6.5)

## 2022-01-18 ENCOUNTER — Ambulatory Visit
Admission: RE | Admit: 2022-01-18 | Discharge: 2022-01-18 | Disposition: A | Discharge status: Home / Self Care | Payer: BC Managed Care – PPO | Source: Ambulatory Visit | Attending: Internal Medicine | Admitting: Internal Medicine

## 2022-01-18 DIAGNOSIS — R928 Other abnormal and inconclusive findings on diagnostic imaging of breast: Secondary | ICD-10-CM | POA: Insufficient documentation

## 2022-01-19 ENCOUNTER — Encounter: Payer: Self-pay | Admitting: Internal Medicine

## 2022-01-19 ENCOUNTER — Ambulatory Visit: Payer: BC Managed Care – PPO | Admitting: Internal Medicine

## 2022-01-19 DIAGNOSIS — L989 Disorder of the skin and subcutaneous tissue, unspecified: Secondary | ICD-10-CM

## 2022-01-19 DIAGNOSIS — I10 Essential (primary) hypertension: Secondary | ICD-10-CM | POA: Diagnosis not present

## 2022-01-19 DIAGNOSIS — E7800 Pure hypercholesterolemia, unspecified: Secondary | ICD-10-CM

## 2022-01-19 DIAGNOSIS — K219 Gastro-esophageal reflux disease without esophagitis: Secondary | ICD-10-CM

## 2022-01-19 DIAGNOSIS — J4520 Mild intermittent asthma, uncomplicated: Secondary | ICD-10-CM | POA: Diagnosis not present

## 2022-01-19 DIAGNOSIS — B182 Chronic viral hepatitis C: Secondary | ICD-10-CM | POA: Diagnosis not present

## 2022-01-19 DIAGNOSIS — E1165 Type 2 diabetes mellitus with hyperglycemia: Secondary | ICD-10-CM

## 2022-01-19 DIAGNOSIS — J3089 Other allergic rhinitis: Secondary | ICD-10-CM | POA: Diagnosis not present

## 2022-01-19 MED ORDER — CLOTRIMAZOLE-BETAMETHASONE 1-0.05 % EX CREA: 1.0000 | TOPICAL_CREAM | Freq: Two times a day (BID) | CUTANEOUS | 0 refills | Status: DC

## 2022-01-19 NOTE — Progress Notes (Signed)
Patient ID: Donna Hensley, female   DOB: [birth year], 56 y.o.   MRN: [id number]   Subjective:    Patient ID: Donna Hensley, female    DOB: [birth year], 56 y.o.   MRN: [id number]   Patient here for  Chief Complaint  Patient presents with   Follow-up    Follow up   .   HPI Here to follow up regarding her diabetes, asthma and cholesterol.  Doing relatively well.  Some persistent allergy issues.  Receiving allergy injections.  Taking her medication regularly.  Scheduled for skin test in 2 weeks.  No chest pain.  Breathing overall stable.  No acid reflux reported.  No abdominal pain.  Bowels moving.  Stress -work.  Persistent right ankle lesion.     Past Medical History:  Diagnosis Date   Allergy    Asthma    GERD (gastroesophageal reflux disease)    Gestational diabetes    Hypertension    Past Surgical History:  Procedure Laterality Date   APPENDECTOMY  1986   BREAST BIOPSY Left 08/26/2020   Affirm bx-"RIBBON" clip path pending   [place]  2013   COLONOSCOPY WITH PROPOFOL N/A 05/05/2018   Procedure: COLONOSCOPY WITH PROPOFOL;  Surgeon: [name], MD;  Location: [facility] ENDOSCOPY;  Service: Endoscopy;  Laterality: N/A;   ESOPHAGOGASTRODUODENOSCOPY (EGD) WITH PROPOFOL N/A 05/05/2018   Procedure: ESOPHAGOGASTRODUODENOSCOPY (EGD) WITH PROPOFOL;  Surgeon: [name], MD;  Location: [facility] ENDOSCOPY;  Service: Endoscopy;  Laterality: N/A;   TONSILLECTOMY  1987   wisdom tooth removal     Family History  Problem Relation Age of Onset   Hyperlipidemia Mother    Osteoporosis Mother    Hypertension Father    Arthritis Father    Sleep apnea Father    Heart disease Father    Sleep apnea Brother    Arthritis Paternal Grandmother    Diabetes Paternal Grandmother    Breast cancer Neg Hx    Social History   Socioeconomic History   Marital status: Married    Spouse name: Not on file   Number of children: 2   Years of education:  Not on file   Highest education level: Not on file  Occupational History   Occupation: csr    Employer: [employer]  Tobacco Use   Smoking status: Former    Packs/day: 0.25    Years: 5.00    Total pack years: 1.25    Types: Cigarettes    Quit date: 09/28/1988    Years since quitting: 33.3   Smokeless tobacco: Never   Tobacco comments:    smoked briefly as a teenager  Vaping Use   Vaping Use: Never used  Substance and Sexual Activity   Alcohol use: Yes    Alcohol/week: 1.0 standard drink of alcohol    Types: 1 Glasses of wine per week    Comment: Occasional 1-2 per month   Drug use: Never   Sexual activity: Not on file  Other Topics Concern   Not on file  Social History Narrative   Regular exercise-yes, walk   Diet: fast food, trying to improve diet         Social Determinants of Health   Financial Resource Strain: Not on file  Food Insecurity: Not on file  Transportation Needs: Not on file  Physical Activity: Not on file  Stress: Not on file  Social Connections: Not on file     Review of  Systems  Constitutional:  Negative for appetite change and unexpected weight change.  HENT:  Negative for congestion and sinus pressure.   Respiratory:  Negative for cough, chest tightness and shortness of breath.   Cardiovascular:  Negative for chest pain, palpitations and leg swelling.  Gastrointestinal:  Negative for abdominal pain, diarrhea, nausea and vomiting.  Genitourinary:  Negative for difficulty urinating and dysuria.  Musculoskeletal:  Negative for joint swelling and myalgias.  Skin:  Negative for color change and rash.  Neurological:  Negative for dizziness, light-headedness and headaches.  Psychiatric/Behavioral:  Negative for agitation and dysphoric mood.        Objective:     BP 120/74 (BP Location: Left Arm, Patient Position: Sitting, Cuff Size: Normal)   Pulse 82   Temp 98.1 F (36.7 C) (Oral)   Ht $R'5\' 2"'oI$  (1.575 m)   Wt 197 lb 3.2 oz (89.4  kg)   LMP 05/05/2017   SpO2 94%   BMI 36.07 kg/m  Wt Readings from Last 3 Encounters:  01/19/22 197 lb 3.2 oz (89.4 kg)  09/18/21 195 lb 3.2 oz (88.5 kg)  08/14/21 197 lb (89.4 kg)    Physical Exam Vitals reviewed.  Constitutional:      General: She is not in acute distress.    Appearance: Normal appearance.  HENT:     Head: Normocephalic and atraumatic.     Right Ear: External ear normal.     Left Ear: External ear normal.  Eyes:     General: No scleral icterus.       Right eye: No discharge.        Left eye: No discharge.     Conjunctiva/sclera: Conjunctivae normal.  Neck:     Thyroid: No thyromegaly.  Cardiovascular:     Rate and Rhythm: Normal rate and regular rhythm.  Pulmonary:     Effort: No respiratory distress.     Breath sounds: Normal breath sounds. No wheezing.  Abdominal:     General: Bowel sounds are normal.     Palpations: Abdomen is soft.     Tenderness: There is no abdominal tenderness.  Musculoskeletal:        General: No swelling or tenderness.     Cervical back: Neck supple. No tenderness.  Lymphadenopathy:     Cervical: No cervical adenopathy.  Skin:    Findings: No erythema or rash.  Neurological:     Mental Status: She is alert.  Psychiatric:        Mood and Affect: Mood normal.        Behavior: Behavior normal.      Outpatient Encounter Medications as of 01/19/2022  Medication Sig   albuterol (VENTOLIN HFA) 108 (90 Base) MCG/ACT inhaler Inhale 2 puffs into the lungs every 6 (six) hours as needed for wheezing.   budesonide-formoterol (SYMBICORT) 80-4.5 MCG/ACT inhaler Take 2 puffs first thing in am and then another 2 puffs about 12 hours later.   Calcium-Magnesium-Vitamin D (CALCIUM 500 PO) Take 1 tablet by mouth daily.   Cholecalciferol (VITAMIN D3) 1000 units CAPS Take 1 capsule by mouth daily.   clotrimazole-betamethasone (LOTRISONE) cream Apply 1 Application topically 2 (two) times daily.   Cyanocobalamin (VITAMIN B 12 PO) Take 1  tablet by mouth daily.   EPINEPHrine (EPIPEN 2-PAK) 0.3 mg/0.3 mL IJ SOAJ injection Inject 0.3 mg into the muscle as needed for anaphylaxis.   famotidine (PEPCID) 20 MG tablet One after supper   fluticasone (FLONASE) 50 MCG/ACT nasal spray USE 2 SPRAYS IN  EACH NOSTRIL DAILY   glucose blood test strip Use as instructed   levocetirizine (XYZAL) 5 MG tablet Take 5 mg by mouth every evening.   losartan (COZAAR) 100 MG tablet TAKE 1 TABLET DAILY   metFORMIN (GLUCOPHAGE) 1000 MG tablet TAKE 1 TABLET TWICE A DAY WITH MEALS   montelukast (SINGULAIR) 10 MG tablet TAKE 1 TABLET DAILY AT BEDTIME   Multiple Vitamin (MULTIVITAMIN) tablet Take 1 tablet by mouth daily.   pantoprazole (PROTONIX) 40 MG tablet TAKE 1 TABLET DAILY   rosuvastatin (CRESTOR) 5 MG tablet TAKE 1 TABLET DAILY   VITAMIN E PO Take by mouth daily.   No facility-administered encounter medications on file as of 01/19/2022.     Lab Results  Component Value Date   WBC 8.1 01/16/2022   HGB 14.1 01/16/2022   HCT 41.2 01/16/2022   PLT 241.0 01/16/2022   GLUCOSE 104 (H) 01/16/2022   CHOL 126 01/16/2022   TRIG 78.0 01/16/2022   HDL 61.10 01/16/2022   LDLCALC 50 01/16/2022   ALT 19 01/16/2022   AST 16 01/16/2022   NA 138 01/16/2022   K 3.8 01/16/2022   CL 102 01/16/2022   CREATININE 0.90 01/16/2022   BUN 14 01/16/2022   CO2 25 01/16/2022   TSH 2.14 06/14/2021   INR 0.9 01/12/2021   HGBA1C 6.1 01/16/2022   MICROALBUR 1.6 09/06/2021    MM DIAG BREAST TOMO UNI LEFT  Result Date: 01/18/2022 CLINICAL DATA:  Callback for possible LEFT breast distortion seen on CC view only EXAM: DIGITAL DIAGNOSTIC UNILATERAL LEFT MAMMOGRAM WITH TOMOSYNTHESIS TECHNIQUE: Left digital diagnostic mammography and breast tomosynthesis was performed. COMPARISON:  Previous exam(s). ACR Breast Density Category b: There are scattered areas of fibroglandular density. FINDINGS: The previously described finding does not persist with additional views, consistent  with superimposed fibroglandular tissue. Fibroglandular tissue assumes a configuration stable in comparison to remote prior mammograms. No suspicious mass, microcalcification, or other finding is identified. IMPRESSION: No mammographic evidence of malignancy. RECOMMENDATION: Screening mammogram in one year.(Code:SM-B-01Y) I have discussed the findings and recommendations with the patient. If applicable, a reminder letter will be sent to the patient regarding the next appointment. BI-RADS CATEGORY  1: Negative. Electronically Signed   By: Valentino Saxon M.D.   On: 01/18/2022 15:18       Assessment & Plan:   Problem List Items Addressed This Visit     Chronic hepatitis C without hepatic coma ([place])    Completed 12 weeks of Epclusa.        Relevant Medications   clotrimazole-betamethasone (LOTRISONE) cream   Diabetes (HCC)    Low carb diet and exercise.  Follow met b and a1c. Had eye exam - 01/2021 - [facility].  Request records.       Relevant Orders   Hemoglobin V4Q   Basic metabolic panel   Essential hypertension, benign    Blood pressure as outlined.  On losartan.        GERD    No increased acid reflux.  Protonix.       Hypercholesterolemia    Tolerating crestor 5mg . Follow lipid panel and liver function tests.        Relevant Orders   TSH   Lipid panel   Hepatic function panel   Mild intermittent asthma in adult without complication    Continue symbicort.  Breathing stable.       Skin lesion    Right ankle lesion. Lotrisone cream.  Vendela Troung, MD  

## 2022-01-27 ENCOUNTER — Encounter: Payer: Self-pay | Admitting: Internal Medicine

## 2022-01-27 DIAGNOSIS — L989 Disorder of the skin and subcutaneous tissue, unspecified: Secondary | ICD-10-CM | POA: Insufficient documentation

## 2022-01-27 NOTE — Assessment & Plan Note (Signed)
No increased acid reflux.  Protonix.  

## 2022-01-27 NOTE — Assessment & Plan Note (Signed)
Blood pressure as outlined.  On losartan.

## 2022-01-27 NOTE — Assessment & Plan Note (Signed)
Tolerating crestor 5mg. Follow lipid panel and liver function tests.   

## 2022-01-27 NOTE — Assessment & Plan Note (Addendum)
Right ankle lesion. Lotrisone cream.

## 2022-01-27 NOTE — Assessment & Plan Note (Signed)
Completed 12 weeks of Epclusa.   

## 2022-01-27 NOTE — Assessment & Plan Note (Signed)
Continue symbicort.  Breathing stable.

## 2022-01-27 NOTE — Assessment & Plan Note (Signed)
Low carb diet and exercise.  Follow met b and a1c. Had eye exam - 01/2021 - [facility].  Request records.

## 2022-02-01 DIAGNOSIS — J4530 Mild persistent asthma, uncomplicated: Secondary | ICD-10-CM | POA: Diagnosis not present

## 2022-02-01 DIAGNOSIS — J3089 Other allergic rhinitis: Secondary | ICD-10-CM | POA: Diagnosis not present

## 2022-02-01 DIAGNOSIS — H1045 Other chronic allergic conjunctivitis: Secondary | ICD-10-CM | POA: Diagnosis not present

## 2022-02-06 DIAGNOSIS — J3089 Other allergic rhinitis: Secondary | ICD-10-CM | POA: Diagnosis not present

## 2022-02-23 DIAGNOSIS — J3089 Other allergic rhinitis: Secondary | ICD-10-CM | POA: Diagnosis not present

## 2022-03-08 ENCOUNTER — Other Ambulatory Visit: Payer: Self-pay | Admitting: Internal Medicine

## 2022-03-09 DIAGNOSIS — J3089 Other allergic rhinitis: Secondary | ICD-10-CM | POA: Diagnosis not present

## 2022-03-29 DIAGNOSIS — J301 Allergic rhinitis due to pollen: Secondary | ICD-10-CM | POA: Diagnosis not present

## 2022-03-29 DIAGNOSIS — J3081 Allergic rhinitis due to animal (cat) (dog) hair and dander: Secondary | ICD-10-CM | POA: Diagnosis not present

## 2022-03-29 DIAGNOSIS — J3089 Other allergic rhinitis: Secondary | ICD-10-CM | POA: Diagnosis not present

## 2022-04-03 DIAGNOSIS — Z86010 Personal history of colonic polyps: Secondary | ICD-10-CM | POA: Diagnosis not present

## 2022-04-13 DIAGNOSIS — J3089 Other allergic rhinitis: Secondary | ICD-10-CM | POA: Diagnosis not present

## 2022-05-03 DIAGNOSIS — J3081 Allergic rhinitis due to animal (cat) (dog) hair and dander: Secondary | ICD-10-CM | POA: Diagnosis not present

## 2022-05-03 DIAGNOSIS — J3089 Other allergic rhinitis: Secondary | ICD-10-CM | POA: Diagnosis not present

## 2022-05-03 DIAGNOSIS — J301 Allergic rhinitis due to pollen: Secondary | ICD-10-CM | POA: Diagnosis not present

## 2022-05-10 ENCOUNTER — Other Ambulatory Visit (INDEPENDENT_AMBULATORY_CARE_PROVIDER_SITE_OTHER): Payer: BC Managed Care – PPO

## 2022-05-10 DIAGNOSIS — E7800 Pure hypercholesterolemia, unspecified: Secondary | ICD-10-CM | POA: Diagnosis not present

## 2022-05-10 DIAGNOSIS — E1165 Type 2 diabetes mellitus with hyperglycemia: Secondary | ICD-10-CM | POA: Diagnosis not present

## 2022-05-10 LAB — HEPATIC FUNCTION PANEL
ALT: 18 U/L (ref 0–35)
AST: 15 U/L (ref 0–37)
Albumin: 4.3 g/dL (ref 3.5–5.2)
Alkaline Phosphatase: 73 U/L (ref 39–117)
Bilirubin, Direct: 0.1 mg/dL (ref 0.0–0.3)
Total Bilirubin: 0.6 mg/dL (ref 0.2–1.2)
Total Protein: 6.9 g/dL (ref 6.0–8.3)

## 2022-05-10 LAB — LIPID PANEL
Cholesterol: 132 mg/dL (ref 0–200)
HDL: 63.8 mg/dL (ref >39.00)
LDL Cholesterol: 50 mg/dL (ref 0–99)
NonHDL: 68.51
Total CHOL/HDL Ratio: 2
Triglycerides: 92 mg/dL (ref 0.0–149.0)
VLDL: 18.4 mg/dL (ref 0.0–40.0)

## 2022-05-10 LAB — TSH: TSH: 1.69 u[IU]/mL (ref 0.35–5.50)

## 2022-05-10 LAB — BASIC METABOLIC PANEL
BUN: 13 mg/dL (ref 6–23)
CO2: 26 mEq/L (ref 19–32)
Calcium: 9.5 mg/dL (ref 8.4–10.5)
Chloride: 105 mEq/L (ref 96–112)
Creatinine, Ser: 0.97 mg/dL (ref 0.40–1.20)
GFR: 66.44 mL/min (ref >60.00)
Glucose, Bld: 125 mg/dL — ABNORMAL HIGH (ref 70–99)
Potassium: 4.1 mEq/L (ref 3.5–5.1)
Sodium: 140 mEq/L (ref 135–145)

## 2022-05-10 LAB — HEMOGLOBIN A1C: Hgb A1c MFr Bld: 6.2 % (ref 4.6–6.5)

## 2022-05-11 ENCOUNTER — Encounter: Payer: Self-pay | Admitting: Internal Medicine

## 2022-05-11 ENCOUNTER — Other Ambulatory Visit (HOSPITAL_COMMUNITY)
Admission: RE | Admit: 2022-05-11 | Discharge: 2022-05-11 | Disposition: A | Discharge status: Home / Self Care | Payer: BC Managed Care – PPO | Source: Ambulatory Visit | Attending: Internal Medicine | Admitting: Internal Medicine

## 2022-05-11 ENCOUNTER — Ambulatory Visit: Payer: BC Managed Care – PPO | Admitting: Internal Medicine

## 2022-05-11 VITALS — BP 132/86 | HR 76 | Temp 98.0°F | Resp 15 | Ht 63.0 in | Wt 199.4 lb

## 2022-05-11 DIAGNOSIS — K219 Gastro-esophageal reflux disease without esophagitis: Secondary | ICD-10-CM

## 2022-05-11 DIAGNOSIS — R928 Other abnormal and inconclusive findings on diagnostic imaging of breast: Secondary | ICD-10-CM

## 2022-05-11 DIAGNOSIS — I10 Essential (primary) hypertension: Secondary | ICD-10-CM

## 2022-05-11 DIAGNOSIS — Z0000 Encounter for general adult medical examination without abnormal findings: Secondary | ICD-10-CM

## 2022-05-11 DIAGNOSIS — Z124 Encounter for screening for malignant neoplasm of cervix: Secondary | ICD-10-CM

## 2022-05-11 DIAGNOSIS — E611 Iron deficiency: Secondary | ICD-10-CM

## 2022-05-11 DIAGNOSIS — E1165 Type 2 diabetes mellitus with hyperglycemia: Secondary | ICD-10-CM

## 2022-05-11 DIAGNOSIS — E7800 Pure hypercholesterolemia, unspecified: Secondary | ICD-10-CM

## 2022-05-11 DIAGNOSIS — B182 Chronic viral hepatitis C: Secondary | ICD-10-CM

## 2022-05-11 DIAGNOSIS — J4520 Mild intermittent asthma, uncomplicated: Secondary | ICD-10-CM

## 2022-05-11 MED ORDER — FLUTICASONE PROPIONATE 50 MCG/ACT NA SUSP: 2.0000 | Freq: Every day | NASAL | 3 refills | Status: DC

## 2022-05-11 MED ORDER — MONTELUKAST SODIUM 10 MG PO TABS: 10.0000 mg | ORAL_TABLET | Freq: Every day | ORAL | 3 refills | Status: DC

## 2022-05-11 NOTE — Progress Notes (Unsigned)
Subjective:    Patient ID: Donna Hensley, female    DOB: 1965/07/09, 57 y.o.   MRN: [id number]  Patient here for  Chief Complaint  Patient presents with   Annual Exam    CPE    HPI Here for a physical. Reports she is doing relatively well.  Some increased stress.  Planning to travel to [place] soon for a wedding.  Is now working three days per week.  Stress better with work.  No chest pain or sob reported.  No increased cough or congestion.  Breathing is stable.  No acid reflux reported.  No abdominal pain or bowel change reported.     Past Medical History:  Diagnosis Date   Allergy    Asthma    GERD (gastroesophageal reflux disease)    Gestational diabetes    Hypertension    Past Surgical History:  Procedure Laterality Date   APPENDECTOMY  1986   BREAST BIOPSY Left 08/26/2020   Affirm bx-"RIBBON" clip path pending   [place]  2013   COLONOSCOPY WITH PROPOFOL N/A 05/05/2018   Procedure: COLONOSCOPY WITH PROPOFOL;  Surgeon: [name], MD;  Location: [facility] ENDOSCOPY;  Service: Endoscopy;  Laterality: N/A;   ESOPHAGOGASTRODUODENOSCOPY (EGD) WITH PROPOFOL N/A 05/05/2018   Procedure: ESOPHAGOGASTRODUODENOSCOPY (EGD) WITH PROPOFOL;  Surgeon: [name], MD;  Location: [facility] ENDOSCOPY;  Service: Endoscopy;  Laterality: N/A;   TONSILLECTOMY  1987   wisdom tooth removal     Family History  Problem Relation Age of Onset   Hyperlipidemia Mother    Osteoporosis Mother    Hypertension Father    Arthritis Father    Sleep apnea Father    Heart disease Father    Sleep apnea Brother    Arthritis Paternal Grandmother    Diabetes Paternal Grandmother    Breast cancer Neg Hx    Social History   Socioeconomic History   Marital status: Married    Spouse name: Not on file   Number of children: 2   Years of education: Not on file   Highest education level: Not on file  Occupational History   Occupation: csr    Employer:  [employer]  Tobacco Use   Smoking status: Former    Packs/day: 0.25    Years: 5.00    Total pack years: 1.25    Types: Cigarettes    Quit date: 09/28/1988    Years since quitting: 33.6   Smokeless tobacco: Never   Tobacco comments:    smoked briefly as a teenager  Vaping Use   Vaping Use: Never used  Substance and Sexual Activity   Alcohol use: Yes    Alcohol/week: 1.0 standard drink of alcohol    Types: 1 Glasses of wine per week    Comment: Occasional 1-2 per month   Drug use: Never   Sexual activity: Not on file  Other Topics Concern   Not on file  Social History Narrative   Regular exercise-yes, walk   Diet: fast food, trying to improve diet         Social Determinants of Health   Financial Resource Strain: Not on file  Food Insecurity: Not on file  Transportation Needs: Not on file  Physical Activity: Not on file  Stress: Not on file  Social Connections: Not on file     Review of Systems  Constitutional:  Negative for appetite change and unexpected weight change.  HENT:  Negative for  congestion, sinus pressure and sore throat.   Eyes:  Negative for pain and visual disturbance.  Respiratory:  Negative for cough, chest tightness and shortness of breath.   Cardiovascular:  Negative for chest pain, palpitations and leg swelling.  Gastrointestinal:  Negative for abdominal pain, diarrhea, nausea and vomiting.  Genitourinary:  Negative for difficulty urinating and dysuria.  Musculoskeletal:  Negative for joint swelling and myalgias.  Skin:  Negative for color change and rash.  Neurological:  Negative for dizziness and headaches.  Hematological:  Negative for adenopathy. Does not bruise/bleed easily.  Psychiatric/Behavioral:  Negative for agitation and dysphoric mood.        Objective:     BP 132/86 (BP Location: Left Arm, Patient Position: Sitting, Cuff Size: Large)   Pulse 76   Temp 98 F (36.7 C) (Temporal)   Resp 15   Ht '5\' 3"'$  (1.6 m)   Wt  199 lb 6.4 oz (90.4 kg)   LMP 05/05/2017   SpO2 99%   BMI 35.32 kg/m  Wt Readings from Last 3 Encounters:  05/11/22 199 lb 6.4 oz (90.4 kg)  01/19/22 197 lb 3.2 oz (89.4 kg)  09/18/21 195 lb 3.2 oz (88.5 kg)    Physical Exam Vitals reviewed.  Constitutional:      General: She is not in acute distress.    Appearance: Normal appearance. She is well-developed.  HENT:     Head: Normocephalic and atraumatic.     Right Ear: External ear normal.     Left Ear: External ear normal.  Eyes:     General: No scleral icterus.       Right eye: No discharge.        Left eye: No discharge.     Conjunctiva/sclera: Conjunctivae normal.  Neck:     Thyroid: No thyromegaly.  Cardiovascular:     Rate and Rhythm: Normal rate and regular rhythm.  Pulmonary:     Effort: No tachypnea, accessory muscle usage or respiratory distress.     Breath sounds: Normal breath sounds. No decreased breath sounds or wheezing.  Chest:  Breasts:    Right: No inverted nipple, mass, nipple discharge or tenderness (no axillary adenopathy).     Left: No inverted nipple, mass, nipple discharge or tenderness (no axilarry adenopathy).  Abdominal:     General: Bowel sounds are normal.     Palpations: Abdomen is soft.     Tenderness: There is no abdominal tenderness.  Genitourinary:    Comments: Normal external genitalia.  Vaginal vault without lesions.  Cervix identified.  Pap smear performed.  Could not appreciate any adnexal masses or tenderness.   Musculoskeletal:        General: No swelling or tenderness.     Cervical back: Neck supple.  Lymphadenopathy:     Cervical: No cervical adenopathy.  Skin:    Findings: No erythema or rash.  Neurological:     Mental Status: She is alert and oriented to person, place, and time.  Psychiatric:        Mood and Affect: Mood normal.        Behavior: Behavior normal.      Outpatient Encounter Medications as of 05/11/2022  Medication Sig   albuterol (VENTOLIN HFA) 108 (90  Base) MCG/ACT inhaler Inhale 2 puffs into the lungs every 6 (six) hours as needed for wheezing.   budesonide-formoterol (SYMBICORT) 80-4.5 MCG/ACT inhaler USE 2 INHALATIONS FIRST THING IN THE MORNING AND THEN ANOTHER 2 INHALATIONS ABOUT 12 HOURS LATER   Calcium-Magnesium-Vitamin  D (CALCIUM 500 PO) Take 1 tablet by mouth daily.   Cholecalciferol (VITAMIN D3) 1000 units CAPS Take 1 capsule by mouth daily.   clotrimazole-betamethasone (LOTRISONE) cream Apply 1 Application topically 2 (two) times daily.   Cyanocobalamin (VITAMIN B 12 PO) Take 1 tablet by mouth daily.   EPINEPHrine (EPIPEN 2-PAK) 0.3 mg/0.3 mL IJ SOAJ injection Inject 0.3 mg into the muscle as needed for anaphylaxis.   famotidine (PEPCID) 20 MG tablet One after supper   glucose blood test strip Use as instructed   levocetirizine (XYZAL) 5 MG tablet Take 5 mg by mouth every evening.   losartan (COZAAR) 100 MG tablet TAKE 1 TABLET DAILY   metFORMIN (GLUCOPHAGE) 1000 MG tablet TAKE 1 TABLET TWICE A DAY WITH MEALS   Multiple Vitamin (MULTIVITAMIN) tablet Take 1 tablet by mouth daily.   pantoprazole (PROTONIX) 40 MG tablet TAKE 1 TABLET DAILY   rosuvastatin (CRESTOR) 5 MG tablet TAKE 1 TABLET DAILY   VITAMIN E PO Take by mouth daily.   [DISCONTINUED] fluticasone (FLONASE) 50 MCG/ACT nasal spray USE 2 SPRAYS IN EACH NOSTRIL DAILY   [DISCONTINUED] montelukast (SINGULAIR) 10 MG tablet TAKE 1 TABLET DAILY AT BEDTIME   fluticasone (FLONASE) 50 MCG/ACT nasal spray Place 2 sprays into both nostrils daily.   montelukast (SINGULAIR) 10 MG tablet Take 1 tablet (10 mg total) by mouth at bedtime.   No facility-administered encounter medications on file as of 05/11/2022.     Lab Results  Component Value Date   WBC 8.1 01/16/2022   HGB 14.1 01/16/2022   HCT 41.2 01/16/2022   PLT 241.0 01/16/2022   GLUCOSE 125 (H) 05/10/2022   CHOL 132 05/10/2022   TRIG 92.0 05/10/2022   HDL 63.80 05/10/2022   LDLCALC 50 05/10/2022   ALT 18 05/10/2022    AST 15 05/10/2022   NA 140 05/10/2022   K 4.1 05/10/2022   CL 105 05/10/2022   CREATININE 0.97 05/10/2022   BUN 13 05/10/2022   CO2 26 05/10/2022   TSH 1.69 05/10/2022   INR 0.9 01/12/2021   HGBA1C 6.2 05/10/2022   MICROALBUR 1.6 09/06/2021    MM DIAG BREAST TOMO UNI LEFT  Result Date: 01/18/2022 CLINICAL DATA:  Callback for possible LEFT breast distortion seen on CC view only EXAM: DIGITAL DIAGNOSTIC UNILATERAL LEFT MAMMOGRAM WITH TOMOSYNTHESIS TECHNIQUE: Left digital diagnostic mammography and breast tomosynthesis was performed. COMPARISON:  Previous exam(s). ACR Breast Density Category b: There are scattered areas of fibroglandular density. FINDINGS: The previously described finding does not persist with additional views, consistent with superimposed fibroglandular tissue. Fibroglandular tissue assumes a configuration stable in comparison to remote prior mammograms. No suspicious mass, microcalcification, or other finding is identified. IMPRESSION: No mammographic evidence of malignancy. RECOMMENDATION: Screening mammogram in one year.(Code:SM-B-01Y) I have discussed the findings and recommendations with the patient. If applicable, a reminder letter will be sent to the patient regarding the next appointment. BI-RADS CATEGORY  1: Negative. Electronically Signed   By: Valentino Saxon M.D.   On: 01/18/2022 15:18       Assessment & Plan:  Routine general medical examination at a health care facility  Screening for cervical cancer -     Cytology - PAP  Essential hypertension, benign -     Basic metabolic panel; Future  Hypercholesterolemia Assessment & Plan: Tolerating crestor '5mg'$ . Follow lipid panel and liver function tests.    Orders: -     Lipid panel; Future -     Hepatic function panel; Future  Type  2 diabetes mellitus with hyperglycemia, without long-term current use of insulin (HCC) Assessment & Plan: Low carb diet and exercise.  Follow met b and a1c. Had eye exam - last  month - [facility].  Request records.   Orders: -     Hemoglobin A1c; Future  Health care maintenance Assessment & Plan: Physical today 05/11/22.  PAP 05/11/22. Colonoscopy 04/2018 - recommended f/u in 3 years. Overdue colonoscopy.  Will need to schedule.  Mammogram 06/2020 - abnormal - recommended biopsy.  The patient underwent radiology directed stereotactic biopsy of a density in the left breast on August 26, 2020. Review of the postbiopsy views shows the area of concern completely removed. Pathology was benign. Dr Bary Castilla recommended return to screening mammogram 06/2021.  Had f/u bilateral diagnostic mammogram 11/2021 - recommended f/u left breast mammogram.  F/u left breast mammogram 01/18/22 - [facility].  Recommended screening mammogram in one year.      Abnormal mammogram Assessment & Plan: Mammogram 06/2020 - abnormal - recommended biopsy.  The patient underwent radiology directed stereotactic biopsy of a density in the left breast on August 26, 2020. Review of the postbiopsy views shows the area of concern completely removed. Pathology was benign. Dr Bary Castilla recommended return to screening mammogram 06/2021.  Had f/u bilateral diagnostic mammogram 11/2021 - recommended f/u left breast mammogram.  F/u left breast mammogram 01/18/22 - [facility].  Recommended screening mammogram in one year.    Chronic hepatitis C without hepatic coma (HCC) Assessment & Plan: Completed 12 weeks of Epclusa.     Essential hypertension Assessment & Plan: On losartan.  Blood pressure as outlined.  Have her spot check pressure. Follow pressures.  Follow metabolic panel    Gastroesophageal reflux disease without esophagitis Assessment & Plan: No increased acid reflux.  Protonix.    Iron deficiency Assessment & Plan: Follow cbc and iron studies.    Mild intermittent asthma in adult without complication Assessment & Plan: Continue symbicort.  Singulair.  Breathing stable.    Other orders -      Montelukast Sodium; Take 1 tablet (10 mg total) by mouth at bedtime.  Dispense: 90 tablet; Refill: 3 -     Fluticasone Propionate; Place 2 sprays into both nostrils daily.  Dispense: 48 g; Refill: 3     Einar Pheasant, MD

## 2022-05-12 ENCOUNTER — Telehealth: Payer: Self-pay | Admitting: Internal Medicine

## 2022-05-12 ENCOUNTER — Encounter: Payer: Self-pay | Admitting: Internal Medicine

## 2022-05-12 NOTE — Assessment & Plan Note (Signed)
On losartan.  Blood pressure as outlined.  Have her spot check pressure. Follow pressures.  Follow metabolic panel

## 2022-05-12 NOTE — Assessment & Plan Note (Signed)
Completed 12 weeks of Epclusa.

## 2022-05-12 NOTE — Assessment & Plan Note (Addendum)
Physical today 05/11/22.  PAP 05/11/22. Colonoscopy 04/2018 - recommended f/u in 3 years. Overdue colonoscopy.  Will need to schedule.  Mammogram 06/2020 - abnormal - recommended biopsy.  The patient underwent radiology directed stereotactic biopsy of a density in the left breast on August 26, 2020. Review of the postbiopsy views shows the area of concern completely removed. Pathology was benign. Dr Bary Castilla recommended return to screening mammogram 06/2021.  Had f/u bilateral diagnostic mammogram 11/2021 - recommended f/u left breast mammogram.  F/u left breast mammogram 01/18/22 - [facility].  Recommended screening mammogram in one year.

## 2022-05-12 NOTE — Assessment & Plan Note (Signed)
Low carb diet and exercise.  Follow met b and a1c. Had eye exam - last month - [facility].  Request records.

## 2022-05-12 NOTE — Assessment & Plan Note (Addendum)
Continue symbicort.  Singulair.  Breathing stable.  

## 2022-05-12 NOTE — Assessment & Plan Note (Signed)
Mammogram 06/2020 - abnormal - recommended biopsy.  The patient underwent radiology directed stereotactic biopsy of a density in the left breast on August 26, 2020. Review of the postbiopsy views shows the area of concern completely removed. Pathology was benign. Dr Bary Castilla recommended return to screening mammogram 06/2021.  Had f/u bilateral diagnostic mammogram 11/2021 - recommended f/u left breast mammogram.  F/u left breast mammogram 01/18/22 - [facility].  Recommended screening mammogram in one year.

## 2022-05-12 NOTE — Assessment & Plan Note (Signed)
Follow cbc and iron studies.  

## 2022-05-12 NOTE — Assessment & Plan Note (Signed)
Tolerating crestor 5mg. Follow lipid panel and liver function tests.   

## 2022-05-12 NOTE — Telephone Encounter (Signed)
Per review of records, her last colonoscopy was 2020.  Recommended f/u 2023. She may have said she received notice, but if not,  If agreeable, would like to refer to GI for her colonoscopy.  Was seen at [facility].

## 2022-05-12 NOTE — Assessment & Plan Note (Signed)
No increased acid reflux.  Protonix.

## 2022-05-17 LAB — CYTOLOGY - PAP
Comment: NEGATIVE
Diagnosis: NEGATIVE
Diagnosis: REACTIVE
High risk HPV: NEGATIVE

## 2022-05-25 DIAGNOSIS — J3089 Other allergic rhinitis: Secondary | ICD-10-CM | POA: Diagnosis not present

## 2022-06-04 DIAGNOSIS — Z86010 Personal history of colonic polyps: Secondary | ICD-10-CM | POA: Diagnosis not present

## 2022-06-04 DIAGNOSIS — Z1211 Encounter for screening for malignant neoplasm of colon: Secondary | ICD-10-CM | POA: Diagnosis not present

## 2022-06-04 DIAGNOSIS — K640 First degree hemorrhoids: Secondary | ICD-10-CM | POA: Diagnosis not present

## 2022-06-04 DIAGNOSIS — D123 Benign neoplasm of transverse colon: Secondary | ICD-10-CM | POA: Diagnosis not present

## 2022-06-16 ENCOUNTER — Ambulatory Visit
Admission: RE | Admit: 2022-06-16 | Discharge: 2022-06-16 | Disposition: A | Discharge status: Home / Self Care | Payer: BC Managed Care – PPO | Source: Ambulatory Visit | Attending: Physician Assistant | Admitting: Physician Assistant

## 2022-06-16 VITALS — BP 156/107 | HR 88 | Temp 97.7°F | Resp 14 | Ht 63.0 in | Wt 194.0 lb

## 2022-06-16 DIAGNOSIS — R202 Paresthesia of skin: Secondary | ICD-10-CM | POA: Diagnosis not present

## 2022-06-16 DIAGNOSIS — I10 Essential (primary) hypertension: Secondary | ICD-10-CM

## 2022-06-16 NOTE — ED Triage Notes (Signed)
Patient states that she started exercising on Thursday.  Patient states that she had left sided mid back pain that started after she exercised on Thursday.  Patient states that couple hours late she started having numbness on the left side of her face.  Patient states that her back pain has improved.  Patient states that she still has some numbness on the side of her face.  Patient denies any weakness or changes with her vision.

## 2022-06-16 NOTE — ED Provider Notes (Signed)
[facility]    CSN: [id number] Arrival date & time: 06/16/22  [id number]      History   Chief Complaint Chief Complaint  Patient presents with   Numbness    APPOINTMENT   Back Pain    Left     HPI Donna Hensley is a 57 y.o. female presenting for complaints and concerns regarding a tingling sensation and mild numbness of the left side of her upper lip since yesterday.  She reports she felt a little tingling in the left cheek yesterday but that has gone away today.  She has not had any associated headaches, dizziness, vision changes, neck pain, upper extremity pain, upper extremity numbness/weakness, facial drooping or weakness, difficulty swallowing or speaking, confusion, difficulty walking, chest pain, palpitations, shortness of breath.  She denies any falls or injury.  She denies any similar problems in the past.  She says that she felt a little pain in her left ear yesterday.  She says that she is a little congested but is "always congested."  She denies any rashes, swelling or skin lesions.  She reports that she had a little bit of back pain on the left side a couple days ago when she was working out at [employer] but that has resolved.  She has not taken any medication for her symptoms at all.  She does not have any history of neurological problems.  Her medical history is significant for hypertension, allergies, asthma, diabetes.  She reports that her father had a massive heart attack in his 49s and she is very nervous and anxious about what the potential cause of her symptoms could be.  She is tearful.  HPI  Past Medical History:  Diagnosis Date   Allergy    Asthma    GERD (gastroesophageal reflux disease)    Gestational diabetes    Hypertension     Patient Active Problem List   Diagnosis Date Noted   Skin lesion 01/27/2022   Hypercholesterolemia 07/15/2021   Upper airway cough syndrome 01/24/2021   Chronic hepatitis C without hepatic coma ([place]) 01/12/2021    Abnormal mammogram 01/07/2021   Boil 02/29/2020   Eye irritation 06/13/2019   Conjunctivitis 03/15/2019   Left otitis media 06/23/2015   Essential hypertension 04/28/2015   Respiratory tract infection 04/18/2015   Health care maintenance 02/05/2015   Diabetes ([place]) 09/06/2013   Essential hypertension, benign 11/15/2012   Iron deficiency 10/05/2012   Mild intermittent asthma in adult without complication [id number]   HYPERTHYROIDISM, SUBCLINICAL 08/24/2008   Allergic rhinitis 08/24/2008   GERD 08/24/2008    Past Surgical History:  Procedure Laterality Date   APPENDECTOMY  1986   BREAST BIOPSY Left 08/26/2020   Affirm bx-"RIBBON" clip path pending   [place]  2013   COLONOSCOPY WITH PROPOFOL N/A 05/05/2018   Procedure: COLONOSCOPY WITH PROPOFOL;  Surgeon: [name], MD;  Location: [facility] ENDOSCOPY;  Service: Endoscopy;  Laterality: N/A;   ESOPHAGOGASTRODUODENOSCOPY (EGD) WITH PROPOFOL N/A 05/05/2018   Procedure: ESOPHAGOGASTRODUODENOSCOPY (EGD) WITH PROPOFOL;  Surgeon: [name], MD;  Location: [facility] ENDOSCOPY;  Service: Endoscopy;  Laterality: N/A;   TONSILLECTOMY  1987   wisdom tooth removal      OB History   No obstetric history on file.      Home Medications    Prior to Admission medications   Medication Sig Start Date End Date Taking? Authorizing Provider  levocetirizine (XYZAL) 5 MG tablet Take 5 mg  by mouth every evening. 02/08/16  Yes [provider]  losartan (COZAAR) 100 MG tablet TAKE 1 TABLET DAILY 12/25/21  Yes Einar Pheasant, MD  metFORMIN (GLUCOPHAGE) 1000 MG tablet TAKE 1 TABLET TWICE A DAY WITH MEALS 11/09/21  Yes Einar Pheasant, MD  montelukast (SINGULAIR) 10 MG tablet Take 1 tablet (10 mg total) by mouth at bedtime. 05/11/22  Yes Einar Pheasant, MD  Multiple Vitamin (MULTIVITAMIN) tablet Take 1 tablet by mouth daily.   Yes [provider]  pantoprazole (PROTONIX) 40 MG tablet TAKE 1  TABLET DAILY 11/27/21  Yes Einar Pheasant, MD  rosuvastatin (CRESTOR) 5 MG tablet TAKE 1 TABLET DAILY 12/25/21  Yes Einar Pheasant, MD  albuterol (VENTOLIN HFA) 108 (90 Base) MCG/ACT inhaler Inhale 2 puffs into the lungs every 6 (six) hours as needed for wheezing. 11/09/20   Einar Pheasant, MD  budesonide-formoterol (SYMBICORT) 80-4.5 MCG/ACT inhaler USE 2 INHALATIONS FIRST THING IN THE MORNING AND THEN ANOTHER 2 INHALATIONS ABOUT 12 HOURS LATER 03/08/22   Einar Pheasant, MD  Calcium-Magnesium-Vitamin D (CALCIUM 500 PO) Take 1 tablet by mouth daily.    [provider]  Cholecalciferol (VITAMIN D3) 1000 units CAPS Take 1 capsule by mouth daily.    [provider]  clotrimazole-betamethasone (LOTRISONE) cream Apply 1 Application topically 2 (two) times daily. 01/19/22   Einar Pheasant, MD  Cyanocobalamin (VITAMIN B 12 PO) Take 1 tablet by mouth daily.    [provider]  EPINEPHrine (EPIPEN 2-PAK) 0.3 mg/0.3 mL IJ SOAJ injection Inject 0.3 mg into the muscle as needed for anaphylaxis. 07/15/21   Einar Pheasant, MD  famotidine (PEPCID) 20 MG tablet One after supper 01/24/21   Tanda Rockers, MD  fluticasone [facility]) 50 MCG/ACT nasal spray Place 2 sprays into both nostrils daily. 05/11/22   Einar Pheasant, MD  glucose blood test strip Use as instructed 12/19/16   Einar Pheasant, MD  VITAMIN E PO Take by mouth daily.    [provider]    Family History Family History  Problem Relation Age of Onset   Hyperlipidemia Mother    Osteoporosis Mother    Hypertension Father    Arthritis Father    Sleep apnea Father    Heart disease Father    Sleep apnea Brother    Arthritis Paternal Grandmother    Diabetes Paternal Grandmother    Breast cancer Neg Hx     Social History Social History   Tobacco Use   Smoking status: Former    Packs/day: 0.25    Years: 5.00    Total pack years: 1.25    Types: Cigarettes    Quit date: 09/28/1988    Years since quitting:  33.7   Smokeless tobacco: Never   Tobacco comments:    smoked briefly as a teenager  Vaping Use   Vaping Use: Never used  Substance Use Topics   Alcohol use: Yes    Alcohol/week: 1.0 standard drink of alcohol    Types: 1 Glasses of wine per week    Comment: Occasional 1-2 per month   Drug use: Never     Allergies   Doxycycline, Oxycodone, and Seasonal ic [cholestatin]   Review of Systems Review of Systems  Constitutional:  Negative for fatigue and fever.  HENT:  Negative for congestion, ear pain, rhinorrhea and sore throat.   Eyes:  Negative for photophobia and visual disturbance.  Respiratory:  Negative for cough and shortness of breath.   Cardiovascular:  Negative for chest pain, palpitations and leg  swelling.  Gastrointestinal:  Negative for abdominal pain, nausea and vomiting.  Musculoskeletal:  Negative for arthralgias, myalgias, neck pain and neck stiffness.  Skin:  Negative for color change, rash and wound.  Neurological:  Positive for numbness. Negative for dizziness, tremors, seizures, syncope, facial asymmetry, speech difficulty, weakness, light-headedness and headaches.  Psychiatric/Behavioral:  Negative for confusion.      Physical Exam Triage Vital Signs ED Triage Vitals  Enc Vitals Group     BP 06/16/22 1016 (!) 176/115     Pulse Rate 06/16/22 1016 88     Resp 06/16/22 1016 14     Temp 06/16/22 1016 97.7 F (36.5 C)     Temp Source 06/16/22 1016 Oral     SpO2 06/16/22 1016 98 %     Weight 06/16/22 1013 194 lb (88 kg)     Height 06/16/22 1013 5' 3"$  (1.6 m)     Head Circumference --      Peak Flow --      Pain Score 06/16/22 1012 0     Pain Loc --      Pain Edu? --      Excl. in [place]? --    No data found.  Updated Vital Signs BP (!) 156/107 (BP Location: Right Arm)   Pulse 88   Temp 97.7 F (36.5 C) (Oral)   Resp 14   Ht 5' 3"$  (1.6 m)   Wt 194 lb (88 kg)   LMP 05/05/2017   SpO2 98%   BMI 34.37 kg/m      Physical Exam Vitals and nursing  note reviewed.  Constitutional:      General: She is not in acute distress.    Appearance: Normal appearance. She is not ill-appearing or toxic-appearing.  HENT:     Head: Normocephalic and atraumatic.     Right Ear: Tympanic membrane, ear canal and external ear normal.     Left Ear: Tympanic membrane, ear canal and external ear normal.     Nose: Nose normal.     Mouth/Throat:     Mouth: Mucous membranes are moist.     Pharynx: Oropharynx is clear.  Eyes:     General: No scleral icterus.       Right eye: No discharge.        Left eye: No discharge.     Extraocular Movements: Extraocular movements intact.     Conjunctiva/sclera: Conjunctivae normal.     Pupils: Pupils are equal, round, and reactive to light.  Cardiovascular:     Rate and Rhythm: Normal rate and regular rhythm.     Heart sounds: Normal heart sounds.  Pulmonary:     Effort: Pulmonary effort is normal. No respiratory distress.     Breath sounds: Normal breath sounds.  Musculoskeletal:     Cervical back: Normal range of motion and neck supple. No tenderness.  Skin:    General: Skin is dry.  Neurological:     General: No focal deficit present.     Mental Status: She is alert and oriented to person, place, and time. Mental status is at baseline.     Cranial Nerves: No cranial nerve deficit.     Motor: No weakness.     Coordination: Coordination normal.     Gait: Gait normal.     Comments: Normal nose to finger testing and rapid alternating supination/pronation of hands. 5/5 strength of upper and lower extremities bilaterally.   Psychiatric:        Mood and Affect:  Mood is anxious. Affect is tearful.      UC Treatments / Results  Labs (all labs ordered are listed, but only abnormal results are displayed) Labs Reviewed - No data to display  EKG   Radiology No results found.  Procedures ED EKG  Date/Time: 06/16/2022 11:22 AM  Performed by: Danton Clap, PA-C Authorized by: Danton Clap, PA-C    Previous ECG:    Previous ECG:  Unavailable Interpretation:    Interpretation: normal   Rate:    ECG rate:  70   ECG rate assessment: normal   Rhythm:    Rhythm: sinus rhythm   Ectopy:    Ectopy: none   QRS:    QRS axis:  Normal   QRS intervals:  Normal   QRS conduction: normal   ST segments:    ST segments:  Normal T waves:    T waves: normal   Q waves:    Abnormal Q-waves: not present   Comments:     Normal sinus rhythm with sinus arrhythmia.   (including critical care time)  Medications Ordered in UC Medications - No data to display  Initial Impression / Assessment and Plan / UC Course  I have reviewed the triage vital signs and the nursing notes.  Pertinent labs & imaging results that were available during my care of the patient were reviewed by me and considered in my medical decision making (see chart for details).   57 year old female with history of hypertension and diabetes presents for tingling and faint numbness sensation of the left upper lip since yesterday.  She reports that she did have some tingling in the left cheek yesterday but that has resolved.  She denies any headaches, vision changes, dizziness, blurred vision, drooling, facial drooping, difficulty speaking, chest pain, shortness of breath, palpitations, numbness/tingling or weakness in extremity.  She is never had anything like this before.  Has not taken any new medications.  Patient reports that she takes losartan for her blood pressure and has taken that today.  BP is 156/107.  Patient is anxious and tearful.  She says her blood pressure is not normally this high.  She has normal cranial nerve exam.  No evidence of an ear infection and overall normal HEENT exam.  Her chest is clear to auscultation heart regular rate rhythm.  She has 5 out of 5 strength of bilateral upper and lower extremities.  She is able to move her face and tongue normally without difficulty.  There is no evidence of rashes or  lesions.  An EKG was performed today given the paresthesias and hypertension as well as her father's history of a massive heart attack.  EKG performed today is normal sinus rhythm and regular rate.  Discussed result with the patient.  Unsure as to the cause of patient's lip numbness/tingling but it seems that her symptoms have improved a little from yesterday and she does not have any red flag signs or symptoms.  Normal EKG.  Mildly increased blood pressure but she is obviously agitated, anxious and tearful.  Advised her to continue her losartan dose and check her blood pressure at home.  Keep a log and follow-up with PCP.  At this time, feel patient is safe to go home and monitor her condition very closely.  I thoroughly reviewed ED precautions with her.  We discussed the possibility that she could develop shingles or Bell's palsy and discussed symptoms to look out for regarding that.  She is to go  to the ED for any of the following red flag signs and symptoms listed in the discharge instructions.  She is leaving in stable condition at this time.  Advised her if no improvement in her condition that she should contact her doctor on Monday for an appointment.   Final Clinical Impressions(s) / UC Diagnoses   Final diagnoses:  Facial paresthesia  Essential hypertension     Discharge Instructions      -Your exam is normal and reassuring today. - Your EKG is normal. - We discussed possibilities for your symptoms including general inflammation of the facial nerve.  We also discussed the possibility that you could develop shingles or Bell's palsy so if you do notice a rash restart to get pain, facial drooping, return or go to ER. - The most important signs and symptoms to look out for which would mean immediate trip to the ER by EMS or having someone else take you include: Severe persistent headache, vision changes, speech or balance problems, drooling, facial weakness, change in pupil size, chest  pain, racing heart, shortness of breath, numbness/tingling or weakness in your extremities especially your left arm, vomiting, sweats, generalized weakness, dizziness, neck pain associated with fever and stiffness, or any acute worsening of her symptoms.    ED Prescriptions   None    PDMP not reviewed this encounter.   Danton Clap, PA-C 06/16/22 1127

## 2022-06-16 NOTE — Discharge Instructions (Signed)
-  Your exam is normal and reassuring today. - Your EKG is normal. - We discussed possibilities for your symptoms including general inflammation of the facial nerve.  We also discussed the possibility that you could develop shingles or Bell's palsy so if you do notice a rash restart to get pain, facial drooping, return or go to ER. - The most important signs and symptoms to look out for which would mean immediate trip to the ER by EMS or having someone else take you include: Severe persistent headache, vision changes, speech or balance problems, drooling, facial weakness, change in pupil size, chest pain, racing heart, shortness of breath, numbness/tingling or weakness in your extremities especially your left arm, vomiting, sweats, generalized weakness, dizziness, neck pain associated with fever and stiffness, or any acute worsening of her symptoms.

## 2022-06-21 ENCOUNTER — Encounter: Payer: Self-pay | Admitting: Internal Medicine

## 2022-06-21 ENCOUNTER — Ambulatory Visit: Payer: BC Managed Care – PPO | Admitting: Internal Medicine

## 2022-06-21 VITALS — BP 138/74 | HR 83 | Temp 97.9°F | Resp 16 | Ht 62.0 in | Wt 195.6 lb

## 2022-06-21 DIAGNOSIS — R200 Anesthesia of skin: Secondary | ICD-10-CM | POA: Diagnosis not present

## 2022-06-21 DIAGNOSIS — I10 Essential (primary) hypertension: Secondary | ICD-10-CM

## 2022-06-21 DIAGNOSIS — J3089 Other allergic rhinitis: Secondary | ICD-10-CM | POA: Diagnosis not present

## 2022-06-21 DIAGNOSIS — E1165 Type 2 diabetes mellitus with hyperglycemia: Secondary | ICD-10-CM | POA: Diagnosis not present

## 2022-06-21 DIAGNOSIS — J4520 Mild intermittent asthma, uncomplicated: Secondary | ICD-10-CM

## 2022-06-21 DIAGNOSIS — E7800 Pure hypercholesterolemia, unspecified: Secondary | ICD-10-CM | POA: Diagnosis not present

## 2022-06-21 MED ORDER — AMLODIPINE BESYLATE 2.5 MG PO TABS: 2.5000 mg | ORAL_TABLET | Freq: Every day | ORAL | 2 refills | Status: DC

## 2022-06-21 NOTE — Progress Notes (Signed)
Subjective:    Patient ID: Donna Hensley, female    DOB: [birth year], 57 y.o.   MRN: [id number]  Patient here for  Chief Complaint  Patient presents with   Medical Management of Chronic Issues    HPI Here as a work in - f/u urgent care.  Seen 06/16/22 - tingling sensation and mild numbness of the left side of her upper lip.  Reports that she was working out.  No problems when exercising.  Two hours later - left shoulder bothered her some.  Noticed left side upper lip numb/tingling.  Some tingling initially in the left cheek.  No headache.  No dizziness.  Blood pressure increased - worried about above.  Seen 06/16/22.  Was anxious and tearful.  Blood pressure was elevated.  Exam - no acute abnormality noted.  EKG - ok.  Comes in today for f/u.  Denies any headache or dizziness.  No shoulder pain. No chest pain or sob.  Some persistent change in sensation - left upper lip.  No facial weakness or vision change.  No focal neurological deficits.  No increased sinus symptoms.    Past Medical History:  Diagnosis Date   Allergy    Asthma    GERD (gastroesophageal reflux disease)    Gestational diabetes    Hypertension    Past Surgical History:  Procedure Laterality Date   APPENDECTOMY  1986   BREAST BIOPSY Left 08/26/2020   Affirm bx-"RIBBON" clip path pending   [place]  2013   COLONOSCOPY WITH PROPOFOL N/A 05/05/2018   Procedure: COLONOSCOPY WITH PROPOFOL;  Surgeon: [name], MD;  Location: [facility] ENDOSCOPY;  Service: Endoscopy;  Laterality: N/A;   ESOPHAGOGASTRODUODENOSCOPY (EGD) WITH PROPOFOL N/A 05/05/2018   Procedure: ESOPHAGOGASTRODUODENOSCOPY (EGD) WITH PROPOFOL;  Surgeon: [name], MD;  Location: [facility] ENDOSCOPY;  Service: Endoscopy;  Laterality: N/A;   TONSILLECTOMY  1987   wisdom tooth removal     Family History  Problem Relation Age of Onset   Hyperlipidemia Mother    Osteoporosis Mother    Hypertension Father     Arthritis Father    Sleep apnea Father    Heart disease Father    Sleep apnea Brother    Arthritis Paternal Grandmother    Diabetes Paternal Grandmother    Breast cancer Neg Hx    Social History   Socioeconomic History   Marital status: Married    Spouse name: Not on file   Number of children: 2   Years of education: Not on file   Highest education level: Not on file  Occupational History   Occupation: csr    Employer: [employer]  Tobacco Use   Smoking status: Former    Packs/day: 0.25    Years: 5.00    Total pack years: 1.25    Types: Cigarettes    Quit date: 09/28/1988    Years since quitting: 33.7   Smokeless tobacco: Never   Tobacco comments:    smoked briefly as a teenager  Vaping Use   Vaping Use: Never used  Substance and Sexual Activity   Alcohol use: Yes    Alcohol/week: 1.0 standard drink of alcohol    Types: 1 Glasses of wine per week    Comment: Occasional 1-2 per month   Drug use: Never   Sexual activity: Not on file  Other Topics Concern   Not on file  Social History Narrative   Regular exercise-yes, walk  Diet: fast food, trying to improve diet         Social Determinants of Health   Financial Resource Strain: Not on file  Food Insecurity: Not on file  Transportation Needs: Not on file  Physical Activity: Not on file  Stress: Not on file  Social Connections: Not on file     Review of Systems  Constitutional:  Negative for appetite change, fever and unexpected weight change.  HENT:  Negative for sinus pressure.        No increased congestion.   Respiratory:  Negative for cough, chest tightness and shortness of breath.   Cardiovascular:  Negative for chest pain and palpitations.  Gastrointestinal:  Negative for abdominal pain, diarrhea, nausea and vomiting.  Genitourinary:  Negative for difficulty urinating and dysuria.  Musculoskeletal:  Negative for joint swelling and myalgias.  Skin:  Negative for color change and rash.   Neurological:  Negative for dizziness and headaches.  Psychiatric/Behavioral:  Negative for agitation and dysphoric mood.        Increased anxiety and stress related to symptoms.  Tearful today.        Objective:     BP 138/74   Pulse 83   Temp 97.9 F (36.6 C)   Resp 16   Ht '5\' 2"'$  (1.575 m)   Wt 195 lb 9.6 oz (88.7 kg)   LMP 05/05/2017   SpO2 98%   BMI 35.78 kg/m  Wt Readings from Last 3 Encounters:  06/21/22 195 lb 9.6 oz (88.7 kg)  06/16/22 194 lb (88 kg)  05/11/22 199 lb 6.4 oz (90.4 kg)    Physical Exam Vitals reviewed.  Constitutional:      General: She is not in acute distress.    Appearance: Normal appearance.  HENT:     Head: Normocephalic and atraumatic.     Right Ear: External ear normal.     Left Ear: External ear normal.     Nose: No congestion.     Mouth/Throat:     Pharynx: No oropharyngeal exudate or posterior oropharyngeal erythema.  Eyes:     General: No scleral icterus.       Right eye: No discharge.        Left eye: No discharge.     Conjunctiva/sclera: Conjunctivae normal.  Neck:     Thyroid: No thyromegaly.  Cardiovascular:     Rate and Rhythm: Normal rate and regular rhythm.  Pulmonary:     Effort: No respiratory distress.     Breath sounds: Normal breath sounds. No wheezing.  Abdominal:     General: Bowel sounds are normal.     Palpations: Abdomen is soft.     Tenderness: There is no abdominal tenderness.  Musculoskeletal:        General: No swelling or tenderness.     Cervical back: Neck supple. No tenderness.  Lymphadenopathy:     Cervical: No cervical adenopathy.  Skin:    Findings: No erythema or rash.  Neurological:     Mental Status: She is alert.     Comments: Cranial nerves- appear to be grossly intact.  No focal abnormality appreciated on exam.  No facial drooping.  No sensation change to pin prick or light touch.    Psychiatric:        Mood and Affect: Mood normal.        Behavior: Behavior normal.       Outpatient Encounter Medications as of 06/21/2022  Medication Sig   amLODipine (NORVASC) 2.5 MG  tablet Take 1 tablet (2.5 mg total) by mouth daily.   albuterol (VENTOLIN HFA) 108 (90 Base) MCG/ACT inhaler Inhale 2 puffs into the lungs every 6 (six) hours as needed for wheezing.   budesonide-formoterol (SYMBICORT) 80-4.5 MCG/ACT inhaler USE 2 INHALATIONS FIRST THING IN THE MORNING AND THEN ANOTHER 2 INHALATIONS ABOUT 12 HOURS LATER   Calcium-Magnesium-Vitamin D (CALCIUM 500 PO) Take 1 tablet by mouth daily.   Cholecalciferol (VITAMIN D3) 1000 units CAPS Take 1 capsule by mouth daily.   clotrimazole-betamethasone (LOTRISONE) cream Apply 1 Application topically 2 (two) times daily.   Cyanocobalamin (VITAMIN B 12 PO) Take 1 tablet by mouth daily.   EPINEPHrine (EPIPEN 2-PAK) 0.3 mg/0.3 mL IJ SOAJ injection Inject 0.3 mg into the muscle as needed for anaphylaxis.   famotidine (PEPCID) 20 MG tablet One after supper   fluticasone (FLONASE) 50 MCG/ACT nasal spray Place 2 sprays into both nostrils daily.   glucose blood test strip Use as instructed   levocetirizine (XYZAL) 5 MG tablet Take 5 mg by mouth every evening.   losartan (COZAAR) 100 MG tablet TAKE 1 TABLET DAILY   metFORMIN (GLUCOPHAGE) 1000 MG tablet TAKE 1 TABLET TWICE A DAY WITH MEALS   montelukast (SINGULAIR) 10 MG tablet Take 1 tablet (10 mg total) by mouth at bedtime.   Multiple Vitamin (MULTIVITAMIN) tablet Take 1 tablet by mouth daily.   pantoprazole (PROTONIX) 40 MG tablet TAKE 1 TABLET DAILY   rosuvastatin (CRESTOR) 5 MG tablet TAKE 1 TABLET DAILY   VITAMIN E PO Take by mouth daily.   No facility-administered encounter medications on file as of 06/21/2022.     Lab Results  Component Value Date   WBC 8.1 01/16/2022   HGB 14.1 01/16/2022   HCT 41.2 01/16/2022   PLT 241.0 01/16/2022   GLUCOSE 125 (H) 05/10/2022   CHOL 132 05/10/2022   TRIG 92.0 05/10/2022   HDL 63.80 05/10/2022   LDLCALC 50 05/10/2022   ALT 18  05/10/2022   AST 15 05/10/2022   NA 140 05/10/2022   K 4.1 05/10/2022   CL 105 05/10/2022   CREATININE 0.97 05/10/2022   BUN 13 05/10/2022   CO2 26 05/10/2022   TSH 1.69 05/10/2022   INR 0.9 01/12/2021   HGBA1C 6.2 05/10/2022   MICROALBUR 1.6 09/06/2021    No results found.     Assessment & Plan:  Type 2 diabetes mellitus with hyperglycemia, without long-term current use of insulin (HCC) Assessment & Plan: Low carb diet and exercise.  Follow met b and a1c. Had eye exam - [facility] 03/2022.    Essential hypertension Assessment & Plan: On losartan.  Blood pressure as outlined.  Add amlodipine 2.'5mg'$  q day. Have her spot check pressure. Follow pressures.  Follow metabolic panel    Hypercholesterolemia Assessment & Plan: Tolerating crestor '5mg'$ . Follow lipid panel and liver function tests.     Numbness of lip Assessment & Plan: Localized to small area above left upper lip.  No focal neurological deficits noted on exam.  Blood pressure improved.  Will add amlodpine 2.'5mg'$  for better control.  No headache.  No vision change. No dizziness.  No rash.  No facial drooping.  Unclear etiology.  Anxious about etiology.  Discussed further evaluation.  Hold on scanning.  Desires referral to neurology.  Notify or be evaluated if symptoms change/progress, etc.   Orders: -     Ambulatory referral to Neurology  Mild intermittent asthma in adult without complication Assessment & Plan: Continue symbicort.  Singulair.  Breathing stable.    Other orders -     amLODIPine Besylate; Take 1 tablet (2.5 mg total) by mouth daily.  Dispense: 30 tablet; Refill: 2     Einar Pheasant, MD

## 2022-07-01 ENCOUNTER — Encounter: Payer: Self-pay | Admitting: Internal Medicine

## 2022-07-01 DIAGNOSIS — R200 Anesthesia of skin: Secondary | ICD-10-CM | POA: Insufficient documentation

## 2022-07-01 NOTE — Assessment & Plan Note (Signed)
Tolerating crestor '5mg'$ . Follow lipid panel and liver function tests.

## 2022-07-01 NOTE — Assessment & Plan Note (Addendum)
Localized to small area above left upper lip.  No focal neurological deficits noted on exam.  Blood pressure improved.  Will add amlodpine 2.'5mg'$  for better control.  No headache.  No vision change. No dizziness.  No rash.  No facial drooping.  Unclear etiology.  Anxious about etiology.  Discussed further evaluation.  Hold on scanning.  Desires referral to neurology.  Notify or be evaluated if symptoms change/progress, etc.

## 2022-07-01 NOTE — Assessment & Plan Note (Addendum)
On losartan.  Blood pressure as outlined.  Add amlodipine 2.'5mg'$  q day. Have her spot check pressure. Follow pressures.  Follow metabolic panel

## 2022-07-01 NOTE — Assessment & Plan Note (Signed)
Continue symbicort.  Singulair.  Breathing stable.

## 2022-07-01 NOTE — Assessment & Plan Note (Signed)
Low carb diet and exercise.  Follow met b and a1c. Had eye exam - [facility] 03/2022.

## 2022-07-03 NOTE — Progress Notes (Addendum)
NEUROLOGY CONSULTATION NOTE  Donna Hensley MRN: [id number] DOB: Jul 12, 1965  Referring provider: Einar Pheasant, MD Primary care provider: Einar Pheasant, MD  Reason for consult:  facial paresthesia  Assessment/Plan:   Facial paresthesias - involving the left V2 distribution - possibly irritation of the trigeminal nerve.  However, I would still check MRI of brain to rule out brainstem stroke.  Symptoms not consistent with cerebral aneurysm.  She does not have headache or diplopia.  As she is asymptomatic for aneurysm and only one first degree relative with cerebral aneurysm, routine screening not indicated.    MRI of brain without contrast.  Further recommendations pending results.  07/16/2022 ADDENDUM:  MRI of brain performed on 07/15/2022 is unremarkable.  No explanation for facial numbness.  Unfortunately, there aren't any medications that can treat numbness.  No further recommendations.  Subjective:  Donna Hensley is a 57 year old right-handed female with HTN, type 2 diabetes mellitus, asthma and GERD who presents for facial paresthesia.  History supplemented by ED and referring provider's notes.    On 06/15/2022, she was exercising when she started feeling pain in her left shoulder which occurs from time to time.  Afterwards, she developed sudden pins and needles sensation involving the left upper lip radiating back over her cheek.  No headache, pain, facial droop, slurred speech, visual disturbance, dizziness or unilateral extremity numbness or weakness.  As symptoms persisted, she went to the ED the following day.  Blood pressure was 154/107 which was believed to be attributed to anxiety.  EKG was unremarkable.  Early Bell's palsy was considered but a definite diagnosis was not established.  Since then, symptoms are still present but have improved.    Labs from the previous month include BMP with glucose 125, normal hepatic panel, Hgb A1c 6.2 and TSH 1.69   Family  history:  her father had history of cerebral aneurysm.  PAST MEDICAL HISTORY: Past Medical History:  Diagnosis Date   Allergy    Asthma    GERD (gastroesophageal reflux disease)    Gestational diabetes    Hypertension     PAST SURGICAL HISTORY: Past Surgical History:  Procedure Laterality Date   APPENDECTOMY  1986   BREAST BIOPSY Left 08/26/2020   Affirm bx-"RIBBON" clip path pending   [place]  2013   COLONOSCOPY WITH PROPOFOL N/A 05/05/2018   Procedure: COLONOSCOPY WITH PROPOFOL;  Surgeon: [name], MD;  Location: [facility] ENDOSCOPY;  Service: Endoscopy;  Laterality: N/A;   ESOPHAGOGASTRODUODENOSCOPY (EGD) WITH PROPOFOL N/A 05/05/2018   Procedure: ESOPHAGOGASTRODUODENOSCOPY (EGD) WITH PROPOFOL;  Surgeon: [name], MD;  Location: [facility] ENDOSCOPY;  Service: Endoscopy;  Laterality: N/A;   TONSILLECTOMY  1987   wisdom tooth removal      MEDICATIONS: Current Outpatient Medications on File Prior to Visit  Medication Sig Dispense Refill   albuterol (VENTOLIN HFA) 108 (90 Base) MCG/ACT inhaler Inhale 2 puffs into the lungs every 6 (six) hours as needed for wheezing. 6.7 g 2   amLODipine (NORVASC) 2.5 MG tablet Take 1 tablet (2.5 mg total) by mouth daily. 30 tablet 2   budesonide-formoterol (SYMBICORT) 80-4.5 MCG/ACT inhaler USE 2 INHALATIONS FIRST THING IN THE MORNING AND THEN ANOTHER 2 INHALATIONS ABOUT 12 HOURS LATER 30.6 g 3   Calcium-Magnesium-Vitamin D (CALCIUM 500 PO) Take 1 tablet by mouth daily.     Cholecalciferol (VITAMIN D3) 1000 units CAPS Take 1 capsule by mouth daily.     clotrimazole-betamethasone (  LOTRISONE) cream Apply 1 Application topically 2 (two) times daily. 30 g 0   Cyanocobalamin (VITAMIN B 12 PO) Take 1 tablet by mouth daily.     EPINEPHrine (EPIPEN 2-PAK) 0.3 mg/0.3 mL IJ SOAJ injection Inject 0.3 mg into the muscle as needed for anaphylaxis. 2 each 0   famotidine (PEPCID) 20 MG tablet One after supper  0    fluticasone (FLONASE) 50 MCG/ACT nasal spray Place 2 sprays into both nostrils daily. 48 g 3   glucose blood test strip Use as instructed 200 each 3   levocetirizine (XYZAL) 5 MG tablet Take 5 mg by mouth every evening.  3   losartan (COZAAR) 100 MG tablet TAKE 1 TABLET DAILY 90 tablet 3   metFORMIN (GLUCOPHAGE) 1000 MG tablet TAKE 1 TABLET TWICE A DAY WITH MEALS 180 tablet 3   montelukast (SINGULAIR) 10 MG tablet Take 1 tablet (10 mg total) by mouth at bedtime. 90 tablet 3   Multiple Vitamin (MULTIVITAMIN) tablet Take 1 tablet by mouth daily.     pantoprazole (PROTONIX) 40 MG tablet TAKE 1 TABLET DAILY 90 tablet 3   rosuvastatin (CRESTOR) 5 MG tablet TAKE 1 TABLET DAILY 90 tablet 3   VITAMIN E PO Take by mouth daily.     No current facility-administered medications on file prior to visit.    ALLERGIES: Allergies  Allergen Reactions   Doxycycline Nausea And Vomiting   Oxycodone Nausea And Vomiting   Seasonal Ic [Cholestatin]     FAMILY HISTORY: Family History  Problem Relation Age of Onset   Hyperlipidemia Mother    Osteoporosis Mother    Hypertension Father    Arthritis Father    Sleep apnea Father    Heart disease Father    Sleep apnea Brother    Arthritis Paternal Grandmother    Diabetes Paternal Grandmother    Breast cancer Neg Hx     Objective:  Blood pressure (!) 143/91, pulse 89, height 5\' 2"  (1.575 m), weight 198 lb 12.8 oz (90.2 kg), last menstrual period 05/05/2017, SpO2 98 %. General: No acute distress.  Patient appears well-groomed.   Head:  Normocephalic/atraumatic Eyes:  fundi examined but not visualized Neck: supple, no paraspinal tenderness, full range of motion Back: No paraspinal tenderness Heart: regular rate and rhythm Lungs: Clear to auscultation bilaterally. Vascular: No carotid bruits. Neurological Exam: Mental status: alert and oriented to person, place, and time, speech fluent and not dysarthric, language intact. Cranial nerves: CN I: not  tested CN II: pupils equal, round and reactive to light, visual fields intact CN III, IV, VI:  full range of motion, no nystagmus, no ptosis CN V: facial sensation intact. CN VII: upper and lower face symmetric CN VIII: hearing intact CN IX, X: gag intact, uvula midline CN XI: sternocleidomastoid and trapezius muscles intact CN XII: tongue midline Bulk & Tone: normal, no fasciculations. Motor:  muscle strength 5/5 throughout Sensation:  Pinprick, temperature and vibratory sensation intact. Deep Tendon Reflexes:  2+ throughout,  toes downgoing.   Finger to nose testing:  Without dysmetria.   Gait:  Normal station and stride.  Romberg negative.    Thank you for allowing me to take part in the care of this patient.  Metta Clines, DO  CC: Einar Pheasant, MD

## 2022-07-04 ENCOUNTER — Encounter: Payer: Self-pay | Admitting: Neurology

## 2022-07-04 ENCOUNTER — Ambulatory Visit: Payer: BC Managed Care – PPO | Admitting: Neurology

## 2022-07-04 VITALS — BP 143/91 | HR 89 | Ht 62.0 in | Wt 198.8 lb

## 2022-07-04 DIAGNOSIS — R202 Paresthesia of skin: Secondary | ICD-10-CM | POA: Diagnosis not present

## 2022-07-04 NOTE — Patient Instructions (Signed)
Will check MRI of brain without contrast Further recommendations pending results.

## 2022-07-05 ENCOUNTER — Encounter: Payer: Self-pay | Admitting: Neurology

## 2022-07-06 DIAGNOSIS — J3081 Allergic rhinitis due to animal (cat) (dog) hair and dander: Secondary | ICD-10-CM | POA: Diagnosis not present

## 2022-07-06 DIAGNOSIS — J301 Allergic rhinitis due to pollen: Secondary | ICD-10-CM | POA: Diagnosis not present

## 2022-07-06 DIAGNOSIS — J3089 Other allergic rhinitis: Secondary | ICD-10-CM | POA: Diagnosis not present

## 2022-07-13 DIAGNOSIS — J301 Allergic rhinitis due to pollen: Secondary | ICD-10-CM | POA: Diagnosis not present

## 2022-07-13 DIAGNOSIS — J3089 Other allergic rhinitis: Secondary | ICD-10-CM | POA: Diagnosis not present

## 2022-07-13 DIAGNOSIS — J3081 Allergic rhinitis due to animal (cat) (dog) hair and dander: Secondary | ICD-10-CM | POA: Diagnosis not present

## 2022-07-15 ENCOUNTER — Ambulatory Visit
Admission: RE | Admit: 2022-07-15 | Discharge: 2022-07-15 | Disposition: A | Discharge status: Home / Self Care | Payer: BC Managed Care – PPO | Source: Ambulatory Visit | Attending: Neurology | Admitting: Neurology

## 2022-07-15 DIAGNOSIS — R200 Anesthesia of skin: Secondary | ICD-10-CM | POA: Diagnosis not present

## 2022-07-15 DIAGNOSIS — R202 Paresthesia of skin: Secondary | ICD-10-CM

## 2022-07-16 NOTE — Progress Notes (Signed)
Patient advised of her MRI results.

## 2022-07-20 DIAGNOSIS — J301 Allergic rhinitis due to pollen: Secondary | ICD-10-CM | POA: Diagnosis not present

## 2022-07-20 DIAGNOSIS — J3089 Other allergic rhinitis: Secondary | ICD-10-CM | POA: Diagnosis not present

## 2022-07-20 DIAGNOSIS — J3081 Allergic rhinitis due to animal (cat) (dog) hair and dander: Secondary | ICD-10-CM | POA: Diagnosis not present

## 2022-07-31 ENCOUNTER — Encounter: Payer: Self-pay | Admitting: Internal Medicine

## 2022-08-01 NOTE — Telephone Encounter (Signed)
Please call her and let her know that blood pressures are ok, but I would like to see them a little lower.  Per note, she is currently taking amlodipine 2.5mg  q day.  I would like to increase to 5mg  q day.  Given changing dose, would recommend send to local pharmacy and see how blood pressure responds.

## 2022-08-01 NOTE — Telephone Encounter (Signed)
LMTCB

## 2022-08-03 DIAGNOSIS — J3089 Other allergic rhinitis: Secondary | ICD-10-CM | POA: Diagnosis not present

## 2022-08-03 DIAGNOSIS — J301 Allergic rhinitis due to pollen: Secondary | ICD-10-CM | POA: Diagnosis not present

## 2022-08-03 DIAGNOSIS — J3081 Allergic rhinitis due to animal (cat) (dog) hair and dander: Secondary | ICD-10-CM | POA: Diagnosis not present

## 2022-08-07 ENCOUNTER — Other Ambulatory Visit: Payer: Self-pay

## 2022-08-07 MED ORDER — AMLODIPINE BESYLATE 5 MG PO TABS: 5.0000 mg | ORAL_TABLET | Freq: Every day | ORAL | 0 refills | Status: DC

## 2022-09-11 ENCOUNTER — Other Ambulatory Visit (INDEPENDENT_AMBULATORY_CARE_PROVIDER_SITE_OTHER): Payer: BC Managed Care – PPO

## 2022-09-11 DIAGNOSIS — E1165 Type 2 diabetes mellitus with hyperglycemia: Secondary | ICD-10-CM

## 2022-09-11 DIAGNOSIS — I10 Essential (primary) hypertension: Secondary | ICD-10-CM | POA: Diagnosis not present

## 2022-09-11 DIAGNOSIS — E7800 Pure hypercholesterolemia, unspecified: Secondary | ICD-10-CM

## 2022-09-11 LAB — HEPATIC FUNCTION PANEL
ALT: 22 U/L (ref 0–35)
AST: 19 U/L (ref 0–37)
Albumin: 4.2 g/dL (ref 3.5–5.2)
Alkaline Phosphatase: 76 U/L (ref 39–117)
Bilirubin, Direct: 0.2 mg/dL (ref 0.0–0.3)
Total Bilirubin: 0.7 mg/dL (ref 0.2–1.2)
Total Protein: 7.2 g/dL (ref 6.0–8.3)

## 2022-09-11 LAB — BASIC METABOLIC PANEL
BUN: 13 mg/dL (ref 6–23)
CO2: 27 mEq/L (ref 19–32)
Calcium: 9.5 mg/dL (ref 8.4–10.5)
Chloride: 104 mEq/L (ref 96–112)
Creatinine, Ser: 0.99 mg/dL (ref 0.40–1.20)
GFR: 64.68 mL/min (ref >60.00)
Glucose, Bld: 124 mg/dL — ABNORMAL HIGH (ref 70–99)
Potassium: 4 mEq/L (ref 3.5–5.1)
Sodium: 139 mEq/L (ref 135–145)

## 2022-09-11 LAB — LIPID PANEL
Cholesterol: 119 mg/dL (ref 0–200)
HDL: 59.3 mg/dL (ref >39.00)
LDL Cholesterol: 47 mg/dL (ref 0–99)
NonHDL: 59.78
Total CHOL/HDL Ratio: 2
Triglycerides: 65 mg/dL (ref 0.0–149.0)
VLDL: 13 mg/dL (ref 0.0–40.0)

## 2022-09-11 LAB — HEMOGLOBIN A1C: Hgb A1c MFr Bld: 6.3 % (ref 4.6–6.5)

## 2022-09-13 ENCOUNTER — Ambulatory Visit: Payer: BC Managed Care – PPO | Admitting: Internal Medicine

## 2022-09-13 ENCOUNTER — Encounter: Payer: Self-pay | Admitting: Internal Medicine

## 2022-09-13 VITALS — BP 126/76 | HR 88 | Temp 97.9°F | Resp 16 | Ht 62.0 in | Wt 198.0 lb

## 2022-09-13 DIAGNOSIS — E1165 Type 2 diabetes mellitus with hyperglycemia: Secondary | ICD-10-CM

## 2022-09-13 DIAGNOSIS — E7800 Pure hypercholesterolemia, unspecified: Secondary | ICD-10-CM

## 2022-09-13 DIAGNOSIS — Z7984 Long term (current) use of oral hypoglycemic drugs: Secondary | ICD-10-CM | POA: Diagnosis not present

## 2022-09-13 DIAGNOSIS — R21 Rash and other nonspecific skin eruption: Secondary | ICD-10-CM

## 2022-09-13 DIAGNOSIS — E611 Iron deficiency: Secondary | ICD-10-CM

## 2022-09-13 DIAGNOSIS — J4520 Mild intermittent asthma, uncomplicated: Secondary | ICD-10-CM

## 2022-09-13 DIAGNOSIS — K219 Gastro-esophageal reflux disease without esophagitis: Secondary | ICD-10-CM

## 2022-09-13 DIAGNOSIS — I10 Essential (primary) hypertension: Secondary | ICD-10-CM

## 2022-09-13 DIAGNOSIS — B182 Chronic viral hepatitis C: Secondary | ICD-10-CM

## 2022-09-13 MED ORDER — AMLODIPINE BESYLATE 5 MG PO TABS: 5.0000 mg | ORAL_TABLET | Freq: Every day | ORAL | 1 refills | Status: DC

## 2022-09-13 NOTE — Progress Notes (Signed)
Subjective:    Patient ID: Donna Hensley, female    DOB: 21-Jul-1965, 57 y.o.   MRN: [id number]  Patient here for  Chief Complaint  Patient presents with   Medical Management of Chronic Issues    HPI Here to follow up regarding hypercholesterolemia, asthma, hypertension and diabetes.  Recently evaluated for facial paresthesia.  He felt probably irritation of the trigeminal nerve.  MRI 07/15/22 - unremarkable.  Last visit, she was started on amlodipine to control her blood pressure.  Blood pressure doing better on 5mg  of amlodipine.  No headache reported.  No dizziness.  No chest pain or sob reported.  She did stop allergy injections - approximately 5 weeks ago.  Some left ear pressure and sinus tenderness.  Using her routine medications.  Controlling.  No abdominal pain or bowel change reported.    Past Medical History:  Diagnosis Date   Allergy    Asthma    GERD (gastroesophageal reflux disease)    Gestational diabetes    Hypertension    Past Surgical History:  Procedure Laterality Date   APPENDECTOMY  1986   BREAST BIOPSY Left 08/26/2020   Affirm bx-"RIBBON" clip path pending   CESAREAN SECTION  1996 & 2000   CHOLECYSTECTOMY  2013   COLONOSCOPY WITH PROPOFOL N/A 05/05/2018   Procedure: COLONOSCOPY WITH PROPOFOL;  Surgeon: Scot Jun, MD;  Location: [facility] ENDOSCOPY;  Service: Endoscopy;  Laterality: N/A;   ESOPHAGOGASTRODUODENOSCOPY (EGD) WITH PROPOFOL N/A 05/05/2018   Procedure: ESOPHAGOGASTRODUODENOSCOPY (EGD) WITH PROPOFOL;  Surgeon: Scot Jun, MD;  Location: [facility] ENDOSCOPY;  Service: Endoscopy;  Laterality: N/A;   TONSILLECTOMY  1987   wisdom tooth removal     Family History  Problem Relation Age of Onset   Hyperlipidemia Mother    Osteoporosis Mother    Hypertension Father    Arthritis Father    Sleep apnea Father    Heart disease Father    Sleep apnea Brother    Dementia Maternal Grandmother    Arthritis Paternal Grandmother    Diabetes Paternal  Grandmother    Migraines Cousin    Breast cancer Neg Hx    Social History   Socioeconomic History   Marital status: Married    Spouse name: Not on file   Number of children: 2   Years of education: Not on file   Highest education level: Not on file  Occupational History   Occupation: Therapist, occupational: [employer] [place]  Tobacco Use   Smoking status: Former    Packs/day: 0.25    Years: 5.00    Additional pack years: 0.00    Total pack years: 1.25    Types: Cigarettes    Quit date: 09/28/1988    Years since quitting: 33.9   Smokeless tobacco: Never   Tobacco comments:    smoked briefly as a teenager  Vaping Use   Vaping Use: Never used  Substance and Sexual Activity   Alcohol use: Yes    Alcohol/week: 1.0 standard drink of alcohol    Types: 1 Glasses of wine per week    Comment: Occasional 1-2 per month   Drug use: Never   Sexual activity: Not on file  Other Topics Concern   Not on file  Social History Narrative   Regular exercise-yes, walk   Diet: fast food, trying to improve diet         Social Determinants of Health   Financial Resource Strain: Not on file  Food Insecurity:  Not on file  Transportation Needs: Not on file  Physical Activity: Not on file  Stress: Not on file  Social Connections: Not on file     Review of Systems  Constitutional:  Negative for appetite change and unexpected weight change.  HENT:  Negative for sore throat.        No increased congestion.   Respiratory:  Negative for cough, chest tightness and shortness of breath.   Cardiovascular:  Negative for chest pain and palpitations.  Gastrointestinal:  Negative for abdominal pain, diarrhea, nausea and vomiting.  Genitourinary:  Negative for difficulty urinating and dysuria.  Musculoskeletal:  Negative for joint swelling and myalgias.  Skin:  Negative for color change and rash.  Neurological:  Negative for dizziness and headaches.  Psychiatric/Behavioral:  Negative for agitation  and dysphoric mood.        Objective:     BP 126/76   Pulse 88   Temp 97.9 F (36.6 C)   Resp 16   Ht 5\' 2"  (1.575 m)   Wt 198 lb (89.8 kg)   LMP 05/05/2017   SpO2 98%   BMI 36.21 kg/m  Wt Readings from Last 3 Encounters:  09/13/22 198 lb (89.8 kg)  07/04/22 198 lb 12.8 oz (90.2 kg)  06/21/22 195 lb 9.6 oz (88.7 kg)    Physical Exam Vitals reviewed.  Constitutional:      General: She is not in acute distress.    Appearance: Normal appearance.  HENT:     Head: Normocephalic and atraumatic.     Right Ear: External ear normal.     Left Ear: External ear normal.  Eyes:     General: No scleral icterus.       Right eye: No discharge.        Left eye: No discharge.     Conjunctiva/sclera: Conjunctivae normal.  Neck:     Thyroid: No thyromegaly.  Cardiovascular:     Rate and Rhythm: Normal rate and regular rhythm.  Pulmonary:     Effort: No respiratory distress.     Breath sounds: Normal breath sounds. No wheezing.  Abdominal:     General: Bowel sounds are normal.     Palpations: Abdomen is soft.     Tenderness: There is no abdominal tenderness.  Musculoskeletal:        General: No swelling or tenderness.     Cervical back: Neck supple. No tenderness.  Lymphadenopathy:     Cervical: No cervical adenopathy.  Skin:    Findings: No erythema or rash.  Neurological:     Mental Status: She is alert.  Psychiatric:        Mood and Affect: Mood normal.        Behavior: Behavior normal.      Outpatient Encounter Medications as of 09/13/2022  Medication Sig   albuterol (VENTOLIN HFA) 108 (90 Base) MCG/ACT inhaler Inhale 2 puffs into the lungs every 6 (six) hours as needed for wheezing.   amLODipine (NORVASC) 5 MG tablet Take 1 tablet (5 mg total) by mouth daily.   clotrimazole-betamethasone (LOTRISONE) cream Apply 1 Application topically 2 (two) times daily.   EPINEPHrine (EPIPEN 2-PAK) 0.3 mg/0.3 mL IJ SOAJ injection Inject 0.3 mg into the muscle as needed for  anaphylaxis.   famotidine (PEPCID) 20 MG tablet One after supper   fluticasone (FLONASE) 50 MCG/ACT nasal spray Place 2 sprays into both nostrils daily.   glucose blood test strip Use as instructed   levocetirizine (XYZAL) 5 MG tablet  Take 5 mg by mouth every evening.   losartan (COZAAR) 100 MG tablet TAKE 1 TABLET DAILY   metFORMIN (GLUCOPHAGE) 1000 MG tablet TAKE 1 TABLET TWICE A DAY WITH MEALS   montelukast (SINGULAIR) 10 MG tablet Take 1 tablet (10 mg total) by mouth at bedtime.   pantoprazole (PROTONIX) 40 MG tablet TAKE 1 TABLET DAILY   rosuvastatin (CRESTOR) 5 MG tablet TAKE 1 TABLET DAILY   [DISCONTINUED] amLODipine (NORVASC) 5 MG tablet Take 1 tablet (5 mg total) by mouth daily.   No facility-administered encounter medications on file as of 09/13/2022.     Lab Results  Component Value Date   WBC 8.1 01/16/2022   HGB 14.1 01/16/2022   HCT 41.2 01/16/2022   PLT 241.0 01/16/2022   GLUCOSE 124 (H) 09/11/2022   CHOL 119 09/11/2022   TRIG 65.0 09/11/2022   HDL 59.30 09/11/2022   LDLCALC 47 09/11/2022   ALT 22 09/11/2022   AST 19 09/11/2022   NA 139 09/11/2022   K 4.0 09/11/2022   CL 104 09/11/2022   CREATININE 0.99 09/11/2022   BUN 13 09/11/2022   CO2 27 09/11/2022   TSH 1.69 05/10/2022   INR 0.9 01/12/2021   HGBA1C 6.3 09/11/2022   MICROALBUR 1.6 09/06/2021    MR BRAIN WO CONTRAST  Result Date: 07/15/2022 CLINICAL DATA:  Provided history: Facial paresthesia. Additional history provided by the scanning technologist: The patient reports left-sided facial numbness since 06/14/2022. EXAM: MRI HEAD WITHOUT CONTRAST TECHNIQUE: Multiplanar, multiecho pulse sequences of the brain and surrounding structures were obtained without intravenous contrast. COMPARISON:  No pertinent prior exams available for comparison. FINDINGS: A routine protocol non-contrast brain MRI was ordered and performed. Brain: No age advanced or lobar predominant parenchymal atrophy. Cavum septum pellucidum  and cavum vergae (common anatomic variant). No cortical encephalomalacia is identified. No significant cerebral white matter disease for age. There is no acute infarct. No evidence of an intracranial mass. No chronic intracranial blood products. No extra-axial fluid collection. No midline shift. Vascular: Maintained flow voids within the proximal large arterial vessels. Skull and upper cervical spine: No focal suspicious marrow lesion. Sinuses/Orbits: No mass or acute finding within the imaged orbits. No significant paranasal sinus disease. IMPRESSION: 1.  No evidence of an acute intracranial abnormality. 2. Cavum septum pellucidum and cavum vergae (common anatomic variant). 3. Otherwise unremarkable non-contrast MRI appearance of the brain for age. Electronically Signed   By: Jackey Loge D.O.   On: 07/15/2022 20:56       Assessment & Plan:  Hypercholesterolemia Assessment & Plan: Tolerating crestor 5mg . Follow lipid panel and liver function tests.    Orders: -     CBC with Differential/Platelet; Future -     Lipid panel; Future -     Hepatic function panel; Future -     Basic metabolic panel; Future  Type 2 diabetes mellitus with hyperglycemia, without long-term current use of insulin (HCC) Assessment & Plan: Low carb diet and exercise.  Follow met b and a1c. Had eye exam - [facility] 03/2022.   Orders: -     Hemoglobin A1c; Future -     Microalbumin / creatinine urine ratio; Future  Rash of foot Assessment & Plan: Trial of lotrimin cream.  Follow.    Chronic hepatitis C without hepatic coma (HCC) Assessment & Plan: Completed 12 weeks of Epclusa.     Essential hypertension Assessment & Plan: On losartan and amlodipine 5mg  q day.  Doing well with the addition of  amlodipine.  Blood pressure as outlined.  Follow pressures.  Follow metabolic panel    Gastroesophageal reflux disease without esophagitis Assessment & Plan: No increased acid reflux.  Protonix.    Iron  deficiency Assessment & Plan: Follow cbc and iron studies.    Mild intermittent asthma in adult without complication Assessment & Plan: Continue symbicort.  Singulair.  Breathing stable.    Other orders -     amLODIPine Besylate; Take 1 tablet (5 mg total) by mouth daily.  Dispense: 90 tablet; Refill: 1     Dale [place], MD

## 2022-09-17 ENCOUNTER — Encounter: Payer: Self-pay | Admitting: Internal Medicine

## 2022-09-17 DIAGNOSIS — R21 Rash and other nonspecific skin eruption: Secondary | ICD-10-CM | POA: Insufficient documentation

## 2022-09-17 NOTE — Assessment & Plan Note (Signed)
Continue symbicort.  Singulair.  Breathing stable.  

## 2022-09-17 NOTE — Assessment & Plan Note (Signed)
No increased acid reflux.  Protonix.  

## 2022-09-17 NOTE — Assessment & Plan Note (Signed)
Completed 12 weeks of Epclusa.   

## 2022-09-17 NOTE — Assessment & Plan Note (Signed)
Tolerating crestor 5mg. Follow lipid panel and liver function tests.   

## 2022-09-17 NOTE — Assessment & Plan Note (Signed)
Low carb diet and exercise.  Follow met b and a1c. Had eye exam - [facility] 03/2022.  

## 2022-09-17 NOTE — Assessment & Plan Note (Signed)
On losartan and amlodipine 5mg  q day.  Doing well with the addition of amlodipine.  Blood pressure as outlined.  Follow pressures.  Follow metabolic panel

## 2022-09-17 NOTE — Assessment & Plan Note (Signed)
Follow cbc and iron studies.  

## 2022-09-17 NOTE — Assessment & Plan Note (Signed)
Trial of lotrimin cream.  Follow.

## 2022-10-31 ENCOUNTER — Encounter: Payer: Self-pay | Admitting: Nurse Practitioner

## 2022-10-31 ENCOUNTER — Telehealth: Payer: BC Managed Care – PPO | Admitting: Nurse Practitioner

## 2022-10-31 DIAGNOSIS — R21 Rash and other nonspecific skin eruption: Secondary | ICD-10-CM

## 2022-10-31 MED ORDER — TRIAMCINOLONE ACETONIDE 0.1 % EX CREA: 1.0000 | TOPICAL_CREAM | Freq: Two times a day (BID) | CUTANEOUS | 0 refills | Status: DC

## 2022-10-31 NOTE — Progress Notes (Signed)
Virtual Visit Consent   TIMMIA VIDAL, you are scheduled for a virtual visit with a [facility] provider today. Just as with appointments in the office, your consent must be obtained to participate. Your consent will be active for this visit and any virtual visit you may have with one of our providers in the next 365 days. If you have a MyChart account, a copy of this consent can be sent to you electronically.  As this is a virtual visit, video technology does not allow for your provider to perform a traditional examination. This may limit your provider's ability to fully assess your condition. If your provider identifies any concerns that need to be evaluated in person or the need to arrange testing (such as labs, EKG, etc.), we will make arrangements to do so. Although advances in technology are sophisticated, we cannot ensure that it will always work on either your end or our end. If the connection with a video visit is poor, the visit may have to be switched to a telephone visit. With either a video or telephone visit, we are not always able to ensure that we have a secure connection.  By engaging in this virtual visit, you consent to the provision of healthcare and authorize for your insurance to be billed (if applicable) for the services provided during this visit. Depending on your insurance coverage, you may receive a charge related to this service.  I need to obtain your verbal consent now. Are you willing to proceed with your visit today? Donna Hensley has provided verbal consent on 10/31/2022 for a virtual visit (video or telephone). Viviano Simas, FNP  Date: 10/31/2022 3:10 PM  Virtual Visit via Video Note   I, Viviano Simas, connected with  Donna Hensley  ([id number], 09-23-1965) on 10/31/22 at  3:15 PM EDT by a video-enabled telemedicine application and verified that I am speaking with the correct person using two identifiers.  Location: Patient: Virtual Visit Location  Patient: Home Provider: Virtual Visit Location Provider: Home Office   I discussed the limitations of evaluation and management by telemedicine and the availability of in person appointments. The patient expressed understanding and agreed to proceed.    History of Present Illness: Donna Hensley is a 57 y.o. who identifies as a female who was assigned female at birth, and is being seen today for a rash on her feet.  The rash started 5 days ago while she was at work  Started on one foot and spread to the other foot by the end of the day  She was swimming last weekend in a pool but no one else has a rash   The rash has stayed contained on her feet  Denies new socks or shoes   The rash is not itchy or painful  She has been using Benadryl spray without any relief   Denies any new medications   She does take Xyzal and Singulair daily   Denies any recent illness fever or sore throat   Problems:  Patient Active Problem List   Diagnosis Date Noted   Rash of foot 09/17/2022   Numbness of lip 07/01/2022   Skin lesion 01/27/2022   Hypercholesterolemia 07/15/2021   Upper airway cough syndrome 01/24/2021   Chronic hepatitis C without hepatic coma (HCC) 01/12/2021   Abnormal mammogram 01/07/2021   Boil 02/29/2020   Eye irritation 06/13/2019   Conjunctivitis 03/15/2019   Left otitis media 06/23/2015   Essential hypertension 04/28/2015   Respiratory tract  infection 04/18/2015   Health care maintenance 02/05/2015   Diabetes (HCC) 09/06/2013   Essential hypertension, benign 11/15/2012   Iron deficiency 10/05/2012   Mild intermittent asthma in adult without complication 06/20/2010   HYPERTHYROIDISM, SUBCLINICAL 08/24/2008   Allergic rhinitis 08/24/2008   GERD 08/24/2008    Allergies:  Allergies  Allergen Reactions   Doxycycline Nausea And Vomiting   Oxycodone Nausea And Vomiting   Seasonal Ic [Cholestatin]    Medications:  Current Outpatient Medications:    albuterol  (VENTOLIN HFA) 108 (90 Base) MCG/ACT inhaler, Inhale 2 puffs into the lungs every 6 (six) hours as needed for wheezing., Disp: 6.7 g, Rfl: 2   amLODipine (NORVASC) 5 MG tablet, Take 1 tablet (5 mg total) by mouth daily., Disp: 90 tablet, Rfl: 1   clotrimazole-betamethasone (LOTRISONE) cream, Apply 1 Application topically 2 (two) times daily., Disp: 30 g, Rfl: 0   EPINEPHrine (EPIPEN 2-PAK) 0.3 mg/0.3 mL IJ SOAJ injection, Inject 0.3 mg into the muscle as needed for anaphylaxis., Disp: 2 each, Rfl: 0   famotidine (PEPCID) 20 MG tablet, One after supper, Disp: , Rfl: 0   fluticasone (FLONASE) 50 MCG/ACT nasal spray, Place 2 sprays into both nostrils daily., Disp: 48 g, Rfl: 3   glucose blood test strip, Use as instructed, Disp: 200 each, Rfl: 3   levocetirizine (XYZAL) 5 MG tablet, Take 5 mg by mouth every evening., Disp: , Rfl: 3   losartan (COZAAR) 100 MG tablet, TAKE 1 TABLET DAILY, Disp: 90 tablet, Rfl: 3   metFORMIN (GLUCOPHAGE) 1000 MG tablet, TAKE 1 TABLET TWICE A DAY WITH MEALS, Disp: 180 tablet, Rfl: 3   montelukast (SINGULAIR) 10 MG tablet, Take 1 tablet (10 mg total) by mouth at bedtime., Disp: 90 tablet, Rfl: 3   pantoprazole (PROTONIX) 40 MG tablet, TAKE 1 TABLET DAILY, Disp: 90 tablet, Rfl: 3   rosuvastatin (CRESTOR) 5 MG tablet, TAKE 1 TABLET DAILY, Disp: 90 tablet, Rfl: 3  Observations/Objective: Patient is well-developed, well-nourished in no acute distress.  Resting comfortably  at home.  Head is normocephalic, atraumatic.  No labored breathing.  Speech is clear and coherent with logical content.  Patient is alert and oriented at baseline.  Macular / erythematous rash to bilateral feet including medial and topical portions without extension into legs. Skin intact no edema noted   Assessment and Plan: 1. Rash and nonspecific skin eruption  - triamcinolone cream (KENALOG) 0.1 %; Apply 1 Application topically 2 (two) times daily.  Dispense: 30 g; Refill: 0    Apply for up to  14 days  Follow up for new or worsening symptoms as discussed   Follow Up Instructions: I discussed the assessment and treatment plan with the patient. The patient was provided an opportunity to ask questions and all were answered. The patient agreed with the plan and demonstrated an understanding of the instructions.  A copy of instructions were sent to the patient via MyChart unless otherwise noted below.    The patient was advised to call back or seek an in-person evaluation if the symptoms worsen or if the condition fails to improve as anticipated.  Time:  I spent 15 minutes with the patient via telehealth technology discussing the above problems/concerns.    Viviano Simas, FNP

## 2022-11-02 ENCOUNTER — Other Ambulatory Visit: Payer: Self-pay | Admitting: Internal Medicine

## 2022-11-20 ENCOUNTER — Other Ambulatory Visit: Payer: Self-pay | Admitting: Internal Medicine

## 2022-12-13 ENCOUNTER — Encounter (INDEPENDENT_AMBULATORY_CARE_PROVIDER_SITE_OTHER): Payer: Self-pay

## 2022-12-18 ENCOUNTER — Other Ambulatory Visit: Payer: Self-pay | Admitting: Internal Medicine

## 2022-12-26 ENCOUNTER — Encounter: Payer: Self-pay | Admitting: Internal Medicine

## 2022-12-26 NOTE — Telephone Encounter (Signed)
Pt scheduled with NP to discuss symptoms. Will go to ED or urgent care if symptoms worsen.

## 2022-12-27 ENCOUNTER — Telehealth: Payer: BC Managed Care – PPO | Admitting: Nurse Practitioner

## 2022-12-27 ENCOUNTER — Encounter: Payer: Self-pay | Admitting: Nurse Practitioner

## 2022-12-27 VITALS — Temp 101.8°F | Ht 62.0 in | Wt 198.0 lb

## 2022-12-27 DIAGNOSIS — J0110 Acute frontal sinusitis, unspecified: Secondary | ICD-10-CM

## 2022-12-27 DIAGNOSIS — Z20822 Contact with and (suspected) exposure to covid-19: Secondary | ICD-10-CM | POA: Diagnosis not present

## 2022-12-27 MED ORDER — PREDNISONE 10 MG PO TABS: ORAL_TABLET | ORAL | 0 refills | Status: DC

## 2022-12-27 MED ORDER — AMOXICILLIN-POT CLAVULANATE 875-125 MG PO TABS: 1.0000 | ORAL_TABLET | Freq: Two times a day (BID) | ORAL | 0 refills | Status: DC

## 2022-12-27 NOTE — Progress Notes (Signed)
Virtual Visit via Video Note  I connected with Donna Hensley on  8/92/24 at 2:36 PM by a video enabled telemedicine application and verified that I am speaking with the correct person using two identifiers.  Patient Location: Home Provider Location: Office/Clinic  I discussed the limitations, risks, security, and privacy concerns of performing an evaluation and management service by video and the availability of in person appointments. I also discussed with the patient that there may be a patient responsible charge related to this service. The patient expressed understanding and agreed to proceed.  Subjective: PCP: Donna McNary, MD  Chief Complaint  Patient presents with   Covid Exposure   HPI Donna Hensley presents for covid exposure . Symptoms started Sunday. Husband tested positive of Saturday.  Tested negative for COVID multiple time including this morning.   Symptoms: fever, frontal sinus tenderness, headache,  No sorethroat , some cough.  No chest pain or SOB.  ROS: Per HPI  Current Outpatient Medications:    albuterol (VENTOLIN HFA) 108 (90 Base) MCG/ACT inhaler, Inhale 2 puffs into the lungs every 6 (six) hours as needed for wheezing., Disp: 6.7 g, Rfl: 2   amLODipine (NORVASC) 5 MG tablet, Take 1 tablet (5 mg total) by mouth daily., Disp: 90 tablet, Rfl: 1   amoxicillin-clavulanate (AUGMENTIN) 875-125 MG tablet, Take 1 tablet by mouth 2 (two) times daily., Disp: 20 tablet, Rfl: 0   famotidine (PEPCID) 20 MG tablet, One after supper, Disp: , Rfl: 0   fluticasone (FLONASE) 50 MCG/ACT nasal spray, Place 2 sprays into both nostrils daily., Disp: 48 g, Rfl: 3   glucose blood test strip, Use as instructed, Disp: 200 each, Rfl: 3   levocetirizine (XYZAL) 5 MG tablet, Take 5 mg by mouth every evening., Disp: , Rfl: 3   losartan (COZAAR) 100 MG tablet, TAKE 1 TABLET DAILY, Disp: 90 tablet, Rfl: 3   metFORMIN (GLUCOPHAGE) 1000 MG tablet, TAKE 1 TABLET TWICE A DAY  WITH MEALS, Disp: 180 tablet, Rfl: 3   montelukast (SINGULAIR) 10 MG tablet, Take 1 tablet (10 mg total) by mouth at bedtime., Disp: 90 tablet, Rfl: 3   pantoprazole (PROTONIX) 40 MG tablet, TAKE 1 TABLET DAILY, Disp: 90 tablet, Rfl: 3   predniSONE (DELTASONE) 10 MG tablet, Take 4 tablets ( total 40 mg) by mouth for 2 days; take 3 tablets ( total 30 mg) by mouth for 2 days; take 2 tablets ( total 20 mg) by mouth for 1 day; take 1 tablet ( total 10 mg) by mouth for 1 day., Disp: 17 tablet, Rfl: 0   rosuvastatin (CRESTOR) 5 MG tablet, TAKE 1 TABLET DAILY, Disp: 90 tablet, Rfl: 3   triamcinolone cream (KENALOG) 0.1 %, Apply 1 Application topically 2 (two) times daily., Disp: 30 g, Rfl: 0   clotrimazole-betamethasone (LOTRISONE) cream, Apply 1 Application topically 2 (two) times daily., Disp: 30 g, Rfl: 0   EPINEPHrine (EPIPEN 2-PAK) 0.3 mg/0.3 mL IJ SOAJ injection, Inject 0.3 mg into the muscle as needed for anaphylaxis., Disp: 2 each, Rfl: 0  Observations/Objective: Today's Vitals   12/27/22 1424  Temp: (!) 101.8 F (38.8 C)  TempSrc: Oral  Weight: 198 lb (89.8 kg)  Height: 5\' 2"  (1.575 m)   Physical Exam Constitutional:      General: She is not in acute distress.    Appearance: Normal appearance.  Eyes:     Conjunctiva/sclera: Conjunctivae normal.  Pulmonary:     Effort: No respiratory distress.  Neurological:  Mental Status: She is alert.  Psychiatric:        Mood and Affect: Mood normal.        Behavior: Behavior normal.        Thought Content: Thought content normal.        Judgment: Judgment normal.     Assessment and Plan: Acute non-recurrent frontal sinusitis Assessment & Plan: Will treat with Augmentin and prednisone tapering. Advised patient to increase fluid intake and rest. Use Tylenol for fever control and bodyaches.    Exposure to confirmed case of COVID-19  Other orders -     predniSONE; Take 4 tablets ( total 40 mg) by mouth for 2 days; take 3 tablets (  total 30 mg) by mouth for 2 days; take 2 tablets ( total 20 mg) by mouth for 1 day; take 1 tablet ( total 10 mg) by mouth for 1 day.  Dispense: 17 tablet; Refill: 0 -     Amoxicillin-Pot Clavulanate; Take 1 tablet by mouth 2 (two) times daily.  Dispense: 20 tablet; Refill: 0    Follow Up Instructions: No follow-ups on file.   I discussed the assessment and treatment plan with the patient. The patient was provided an opportunity to ask questions, and all were answered. The patient agreed with the plan and demonstrated an understanding of the instructions.   The patient was advised to call back or seek an in-person evaluation if the symptoms worsen or if the condition fails to improve as anticipated.  The above assessment and management plan was discussed with the patient. The patient verbalized understanding of and has agreed to the management plan.   Donna Dies, NP

## 2023-01-06 DIAGNOSIS — J0110 Acute frontal sinusitis, unspecified: Secondary | ICD-10-CM | POA: Insufficient documentation

## 2023-01-06 NOTE — Assessment & Plan Note (Signed)
Will treat with Augmentin and prednisone tapering. Advised patient to increase fluid intake and rest. Use Tylenol for fever control and bodyaches.

## 2023-01-14 ENCOUNTER — Other Ambulatory Visit: Payer: BC Managed Care – PPO

## 2023-01-17 ENCOUNTER — Ambulatory Visit: Payer: BC Managed Care – PPO | Admitting: Internal Medicine

## 2023-01-28 ENCOUNTER — Other Ambulatory Visit: Payer: BC Managed Care – PPO

## 2023-01-31 ENCOUNTER — Ambulatory Visit: Payer: BC Managed Care – PPO | Admitting: Internal Medicine

## 2023-02-27 ENCOUNTER — Ambulatory Visit: Payer: BC Managed Care – PPO | Admitting: Internal Medicine

## 2023-03-04 ENCOUNTER — Encounter: Payer: Self-pay | Admitting: *Deleted

## 2023-03-04 ENCOUNTER — Other Ambulatory Visit: Payer: Self-pay | Admitting: Internal Medicine

## 2023-03-20 ENCOUNTER — Other Ambulatory Visit: Payer: BC Managed Care – PPO

## 2023-03-20 DIAGNOSIS — E7800 Pure hypercholesterolemia, unspecified: Secondary | ICD-10-CM

## 2023-03-20 DIAGNOSIS — E1165 Type 2 diabetes mellitus with hyperglycemia: Secondary | ICD-10-CM | POA: Diagnosis not present

## 2023-03-20 LAB — CBC WITH DIFFERENTIAL/PLATELET
Basophils Absolute: 0.1 10*3/uL (ref 0.0–0.1)
Basophils Relative: 0.8 % (ref 0.0–3.0)
Eosinophils Absolute: 0.3 10*3/uL (ref 0.0–0.7)
Eosinophils Relative: 3.4 % (ref 0.0–5.0)
HCT: 42.9 % (ref 36.0–46.0)
Hemoglobin: 13.8 g/dL (ref 12.0–15.0)
Lymphocytes Relative: 33.7 % (ref 12.0–46.0)
Lymphs Abs: 2.5 10*3/uL (ref 0.7–4.0)
MCHC: 32.3 g/dL (ref 30.0–36.0)
MCV: 87.8 fL (ref 78.0–100.0)
Monocytes Absolute: 0.6 10*3/uL (ref 0.1–1.0)
Monocytes Relative: 7.5 % (ref 3.0–12.0)
Neutro Abs: 4.1 10*3/uL (ref 1.4–7.7)
Neutrophils Relative %: 54.6 % (ref 43.0–77.0)
Platelets: 275 10*3/uL (ref 150.0–400.0)
RBC: 4.88 Mil/uL (ref 3.87–5.11)
RDW: 14.7 % (ref 11.5–15.5)
WBC: 7.4 10*3/uL (ref 4.0–10.5)

## 2023-03-20 LAB — HEPATIC FUNCTION PANEL
ALT: 18 U/L (ref 0–35)
AST: 18 U/L (ref 0–37)
Albumin: 4.4 g/dL (ref 3.5–5.2)
Alkaline Phosphatase: 71 U/L (ref 39–117)
Bilirubin, Direct: 0.2 mg/dL (ref 0.0–0.3)
Total Bilirubin: 0.6 mg/dL (ref 0.2–1.2)
Total Protein: 7.2 g/dL (ref 6.0–8.3)

## 2023-03-20 LAB — LIPID PANEL
Cholesterol: 129 mg/dL (ref 0–200)
HDL: 59.1 mg/dL (ref >39.00)
LDL Cholesterol: 55 mg/dL (ref 0–99)
NonHDL: 70.18
Total CHOL/HDL Ratio: 2
Triglycerides: 78 mg/dL (ref 0.0–149.0)
VLDL: 15.6 mg/dL (ref 0.0–40.0)

## 2023-03-20 LAB — MICROALBUMIN / CREATININE URINE RATIO
Creatinine,U: 144.3 mg/dL
Microalb Creat Ratio: 3.1 mg/g (ref 0.0–30.0)
Microalb, Ur: 4.4 mg/dL — ABNORMAL HIGH (ref 0.0–1.9)

## 2023-03-20 LAB — BASIC METABOLIC PANEL
BUN: 11 mg/dL (ref 6–23)
CO2: 27 meq/L (ref 19–32)
Calcium: 9.4 mg/dL (ref 8.4–10.5)
Chloride: 104 meq/L (ref 96–112)
Creatinine, Ser: 0.93 mg/dL (ref 0.40–1.20)
GFR: 69.46 mL/min (ref >60.00)
Glucose, Bld: 121 mg/dL — ABNORMAL HIGH (ref 70–99)
Potassium: 3.9 meq/L (ref 3.5–5.1)
Sodium: 137 meq/L (ref 135–145)

## 2023-03-20 LAB — HEMOGLOBIN A1C: Hgb A1c MFr Bld: 6.4 % (ref 4.6–6.5)

## 2023-03-22 ENCOUNTER — Ambulatory Visit: Payer: BC Managed Care – PPO | Admitting: Internal Medicine

## 2023-03-22 ENCOUNTER — Encounter: Payer: Self-pay | Admitting: Internal Medicine

## 2023-03-22 VITALS — BP 126/74 | HR 87 | Temp 98.0°F | Resp 16 | Ht 62.0 in | Wt 201.0 lb

## 2023-03-22 DIAGNOSIS — Z1231 Encounter for screening mammogram for malignant neoplasm of breast: Secondary | ICD-10-CM

## 2023-03-22 DIAGNOSIS — I10 Essential (primary) hypertension: Secondary | ICD-10-CM | POA: Diagnosis not present

## 2023-03-22 DIAGNOSIS — M79675 Pain in left toe(s): Secondary | ICD-10-CM | POA: Insufficient documentation

## 2023-03-22 DIAGNOSIS — E1165 Type 2 diabetes mellitus with hyperglycemia: Secondary | ICD-10-CM | POA: Diagnosis not present

## 2023-03-22 DIAGNOSIS — B182 Chronic viral hepatitis C: Secondary | ICD-10-CM

## 2023-03-22 DIAGNOSIS — J4520 Mild intermittent asthma, uncomplicated: Secondary | ICD-10-CM

## 2023-03-22 DIAGNOSIS — K219 Gastro-esophageal reflux disease without esophagitis: Secondary | ICD-10-CM

## 2023-03-22 DIAGNOSIS — E7800 Pure hypercholesterolemia, unspecified: Secondary | ICD-10-CM

## 2023-03-22 DIAGNOSIS — E611 Iron deficiency: Secondary | ICD-10-CM

## 2023-03-22 MED ORDER — AMLODIPINE BESYLATE 5 MG PO TABS: 5.0000 mg | ORAL_TABLET | Freq: Every day | ORAL | 3 refills | Status: DC

## 2023-03-22 MED ORDER — FLUTICASONE PROPIONATE 50 MCG/ACT NA SUSP: 2.0000 | Freq: Every day | NASAL | 3 refills | Status: AC

## 2023-03-22 NOTE — Assessment & Plan Note (Signed)
Tolerating crestor.  Low cholesterol diet and exercise.  Follow lipid panel and liver function tests.

## 2023-03-22 NOTE — Assessment & Plan Note (Signed)
Completed 12 weeks of Epclusa.

## 2023-03-22 NOTE — Progress Notes (Signed)
Subjective:    Patient ID: Donna Hensley, female    DOB: [birth year], 57 y.o.   MRN: [id number]  Patient here for  Chief Complaint  Patient presents with   Medical Management of Chronic Issues    HPI Here to follow up regarding hypercholesterolemia, asthma, hypertension and diabetes. Taking amlodipine for her blood pressure. Blood pressure doing well.  Staying active.  Keeping her grandson. Plans to get back into an exercise routine.  Breathing stable.  No acid reflux.  No abdominal pain or bowel change.  She did drop a roast on her left fourth toe.  Was bruised.  Increased pain.  Is  better now.  Able to walk and wear shoes. Handling stress.    Past Medical History:  Diagnosis Date   Allergy    Asthma    GERD (gastroesophageal reflux disease)    Gestational diabetes    Hypertension    Past Surgical History:  Procedure Laterality Date   APPENDECTOMY  1986   BREAST BIOPSY Left 08/26/2020   Affirm bx-"RIBBON" clip path pending   CESAREAN SECTION  1996 & 2000   CHOLECYSTECTOMY  2013   COLONOSCOPY WITH PROPOFOL N/A 05/05/2018   Procedure: COLONOSCOPY WITH PROPOFOL;  Surgeon: Scot Jun, MD;  Location: [facility] ENDOSCOPY;  Service: Endoscopy;  Laterality: N/A;   ESOPHAGOGASTRODUODENOSCOPY (EGD) WITH PROPOFOL N/A 05/05/2018   Procedure: ESOPHAGOGASTRODUODENOSCOPY (EGD) WITH PROPOFOL;  Surgeon: Scot Jun, MD;  Location: [facility] ENDOSCOPY;  Service: Endoscopy;  Laterality: N/A;   TONSILLECTOMY  1987   wisdom tooth removal     Family History  Problem Relation Age of Onset   Hyperlipidemia Mother    Osteoporosis Mother    Hypertension Father    Arthritis Father    Sleep apnea Father    Heart disease Father    Sleep apnea Brother    Dementia Maternal Grandmother    Arthritis Paternal Grandmother    Diabetes Paternal Grandmother    Migraines Cousin    Breast cancer Neg Hx    Social History   Socioeconomic History   Marital status: Married    Spouse name: Not on  file   Number of children: 2   Years of education: Not on file   Highest education level: Associate degree: occupational, Scientist, product/process development, or vocational program  Occupational History   Occupation: Therapist, occupational: [employer] [place]  Tobacco Use   Smoking status: Former    Current packs/day: 0.00    Average packs/day: 0.3 packs/day for 5.0 years (1.3 ttl pk-yrs)    Types: Cigarettes    Start date: 09/29/1983    Quit date: 09/28/1988    Years since quitting: 34.5   Smokeless tobacco: Never   Tobacco comments:    smoked briefly as a teenager  Vaping Use   Vaping status: Never Used  Substance and Sexual Activity   Alcohol use: Yes    Alcohol/week: 1.0 standard drink of alcohol    Types: 1 Glasses of wine per week    Comment: Occasional 1-2 per month   Drug use: Never   Sexual activity: Not on file  Other Topics Concern   Not on file  Social History Narrative   Regular exercise-yes, walk   Diet: fast food, trying to improve diet         Social Determinants of Health   Financial Resource Strain: Low Risk  (03/18/2023)   Overall Financial Resource Strain (CARDIA)    Difficulty of Paying Living Expenses: Not very  hard  Food Insecurity: No Food Insecurity (03/18/2023)   Hunger Vital Sign    Worried About Running Out of Food in the Last Year: Never true    Ran Out of Food in the Last Year: Never true  Transportation Needs: No Transportation Needs (03/18/2023)   PRAPARE - Administrator, Civil Service (Medical): No    Lack of Transportation (Non-Medical): No  Physical Activity: Insufficiently Active (03/18/2023)   Exercise Vital Sign    Days of Exercise per Week: 3 days    Minutes of Exercise per Session: 20 min  Stress: No Stress Concern Present (03/18/2023)   [employer] of Occupational Health - Occupational Stress Questionnaire    Feeling of Stress : Only a little  Social Connections: Moderately Integrated (03/18/2023)   Social Connection and Isolation  Panel [NHANES]    Frequency of Communication with Friends and Family: More than three times a week    Frequency of Social Gatherings with Friends and Family: More than three times a week    Attends Religious Services: 1 to 4 times per year    Active Member of [employer] or Organizations: No    Attends Engineer, structural: Not on file    Marital Status: Married     Review of Systems  Constitutional:  Negative for appetite change and unexpected weight change.  HENT:  Negative for congestion and sinus pressure.   Respiratory:  Negative for cough, chest tightness and shortness of breath.   Cardiovascular:  Negative for chest pain and palpitations.       No increased swelling now.  Sometimes will notice some minimal increase when does not drink as much water.   Gastrointestinal:  Negative for abdominal pain, diarrhea, nausea and vomiting.  Genitourinary:  Negative for difficulty urinating and dysuria.  Musculoskeletal:  Negative for joint swelling and myalgias.       Left fourth toe pain as outlined.   Skin:  Negative for color change and rash.  Neurological:  Negative for dizziness and headaches.  Psychiatric/Behavioral:  Negative for agitation and dysphoric mood.        Objective:     BP 126/74   Pulse 87   Temp 98 F (36.7 C)   Resp 16   Ht 5\' 2"  (1.575 m)   Wt 201 lb (91.2 kg)   LMP 05/05/2017   SpO2 98%   BMI 36.76 kg/m  Wt Readings from Last 3 Encounters:  03/22/23 201 lb (91.2 kg)  12/27/22 198 lb (89.8 kg)  09/13/22 198 lb (89.8 kg)    Physical Exam Vitals reviewed.  Constitutional:      General: She is not in acute distress.    Appearance: Normal appearance.  HENT:     Head: Normocephalic and atraumatic.     Right Ear: External ear normal.     Left Ear: External ear normal.     Mouth/Throat:     Pharynx: No oropharyngeal exudate or posterior oropharyngeal erythema.  Eyes:     General: No scleral icterus.       Right eye: No discharge.        Left  eye: No discharge.     Conjunctiva/sclera: Conjunctivae normal.  Neck:     Thyroid: No thyromegaly.  Cardiovascular:     Rate and Rhythm: Normal rate and regular rhythm.  Pulmonary:     Effort: No respiratory distress.     Breath sounds: Normal breath sounds. No wheezing.  Abdominal:  General: Bowel sounds are normal.     Palpations: Abdomen is soft.     Tenderness: There is no abdominal tenderness.  Musculoskeletal:        General: No swelling.     Cervical back: Neck supple. No tenderness.     Comments: Increased pain palpation left fourth toe.  Decreased bruising.  No pain or the metatarsal.   Lymphadenopathy:     Cervical: No cervical adenopathy.  Skin:    Findings: No erythema or rash.  Neurological:     Mental Status: She is alert.  Psychiatric:        Mood and Affect: Mood normal.        Behavior: Behavior normal.      Outpatient Encounter Medications as of 03/22/2023  Medication Sig   albuterol (VENTOLIN HFA) 108 (90 Base) MCG/ACT inhaler Inhale 2 puffs into the lungs every 6 (six) hours as needed for wheezing.   amLODipine (NORVASC) 5 MG tablet Take 1 tablet (5 mg total) by mouth daily.   famotidine (PEPCID) 20 MG tablet One after supper   fluticasone (FLONASE) 50 MCG/ACT nasal spray Place 2 sprays into both nostrils daily.   glucose blood test strip Use as instructed   levocetirizine (XYZAL) 5 MG tablet Take 5 mg by mouth every evening.   losartan (COZAAR) 100 MG tablet TAKE 1 TABLET DAILY   metFORMIN (GLUCOPHAGE) 1000 MG tablet TAKE 1 TABLET TWICE A DAY WITH MEALS   montelukast (SINGULAIR) 10 MG tablet Take 1 tablet (10 mg total) by mouth at bedtime.   pantoprazole (PROTONIX) 40 MG tablet TAKE 1 TABLET DAILY   rosuvastatin (CRESTOR) 5 MG tablet TAKE 1 TABLET DAILY   triamcinolone cream (KENALOG) 0.1 % Apply 1 Application topically 2 (two) times daily.   [DISCONTINUED] amLODipine (NORVASC) 5 MG tablet TAKE 1 TABLET DAILY   [DISCONTINUED]  amoxicillin-clavulanate (AUGMENTIN) 875-125 MG tablet Take 1 tablet by mouth 2 (two) times daily.   [DISCONTINUED] clotrimazole-betamethasone (LOTRISONE) cream Apply 1 Application topically 2 (two) times daily.   [DISCONTINUED] EPINEPHrine (EPIPEN 2-PAK) 0.3 mg/0.3 mL IJ SOAJ injection Inject 0.3 mg into the muscle as needed for anaphylaxis.   [DISCONTINUED] fluticasone (FLONASE) 50 MCG/ACT nasal spray Place 2 sprays into both nostrils daily.   [DISCONTINUED] predniSONE (DELTASONE) 10 MG tablet Take 4 tablets ( total 40 mg) by mouth for 2 days; take 3 tablets ( total 30 mg) by mouth for 2 days; take 2 tablets ( total 20 mg) by mouth for 1 day; take 1 tablet ( total 10 mg) by mouth for 1 day.   No facility-administered encounter medications on file as of 03/22/2023.     Lab Results  Component Value Date   WBC 7.4 03/20/2023   HGB 13.8 03/20/2023   HCT 42.9 03/20/2023   PLT 275.0 03/20/2023   GLUCOSE 121 (H) 03/20/2023   CHOL 129 03/20/2023   TRIG 78.0 03/20/2023   HDL 59.10 03/20/2023   LDLCALC 55 03/20/2023   ALT 18 03/20/2023   AST 18 03/20/2023   NA 137 03/20/2023   K 3.9 03/20/2023   CL 104 03/20/2023   CREATININE 0.93 03/20/2023   BUN 11 03/20/2023   CO2 27 03/20/2023   TSH 1.69 05/10/2022   INR 0.9 01/12/2021   HGBA1C 6.4 03/20/2023   MICROALBUR 4.4 (H) 03/20/2023    MR BRAIN WO CONTRAST  Result Date: 07/15/2022 CLINICAL DATA:  Provided history: Facial paresthesia. Additional history provided by the scanning technologist: The patient reports left-sided facial numbness  since 06/14/2022. EXAM: MRI HEAD WITHOUT CONTRAST TECHNIQUE: Multiplanar, multiecho pulse sequences of the brain and surrounding structures were obtained without intravenous contrast. COMPARISON:  No pertinent prior exams available for comparison. FINDINGS: A routine protocol non-contrast brain MRI was ordered and performed. Brain: No age advanced or lobar predominant parenchymal atrophy. Cavum septum pellucidum  and cavum vergae (common anatomic variant). No cortical encephalomalacia is identified. No significant cerebral white matter disease for age. There is no acute infarct. No evidence of an intracranial mass. No chronic intracranial blood products. No extra-axial fluid collection. No midline shift. Vascular: Maintained flow voids within the proximal large arterial vessels. Skull and upper cervical spine: No focal suspicious marrow lesion. Sinuses/Orbits: No mass or acute finding within the imaged orbits. No significant paranasal sinus disease. IMPRESSION: 1.  No evidence of an acute intracranial abnormality. 2. Cavum septum pellucidum and cavum vergae (common anatomic variant). 3. Otherwise unremarkable non-contrast MRI appearance of the brain for age. Electronically Signed   By: Jackey Loge D.O.   On: 07/15/2022 20:56       Assessment & Plan:  Visit for screening mammogram -     3D Screening Mammogram, Left and Right; Future  Type 2 diabetes mellitus with hyperglycemia, without long-term current use of insulin (HCC) Assessment & Plan: Low carb diet and exercise.  Follow met b and a1c. Had eye exam - [facility] 03/2022.   Lab Results  Component Value Date   HGBA1C 6.4 03/20/2023     Orders: -     Hemoglobin A1c; Future  Hypercholesterolemia Assessment & Plan: Tolerating crestor.  Low cholesterol diet and exercise.  Follow lipid panel and liver function tests.    Orders: -     Lipid panel; Future -     Hepatic function panel; Future -     Basic metabolic panel; Future -     TSH; Future  Chronic hepatitis C without hepatic coma (HCC) Assessment & Plan: Completed 12 weeks of Epclusa.     Essential hypertension Assessment & Plan: On losartan and amlodipine 5mg  q day. Blood pressure as outlined.  Follow pressures.  Follow metabolic panel. No changes.    Gastroesophageal reflux disease without esophagitis Assessment & Plan: No increased acid reflux.  Protonix.    Iron  deficiency Assessment & Plan: Follow cbc and iron studies.   Orders: -     IBC + Ferritin; Future -     CBC with Differential/Platelet; Future  Mild intermittent asthma in adult without complication Assessment & Plan: Continue symbicort.  Singulair.  Breathing stable.    Toe pain, left Assessment & Plan: Left fourth toe pain s/p injury.  Exam as outlined.  Improved. Pain and bruising improved.  Discussed xray. Will hold given significant improvement.  Follow.    Other orders -     amLODIPine Besylate; Take 1 tablet (5 mg total) by mouth daily.  Dispense: 90 tablet; Refill: 3 -     Fluticasone Propionate; Place 2 sprays into both nostrils daily.  Dispense: 48 g; Refill: 3     Dale Newport, MD

## 2023-03-22 NOTE — Assessment & Plan Note (Signed)
No increased acid reflux.  Protonix.

## 2023-03-22 NOTE — Assessment & Plan Note (Signed)
On losartan and amlodipine 5mg  q day. Blood pressure as outlined.  Follow pressures.  Follow metabolic panel. No changes.

## 2023-03-22 NOTE — Assessment & Plan Note (Signed)
Low carb diet and exercise.  Follow met b and a1c. Had eye exam - [facility] 03/2022.   Lab Results  Component Value Date   HGBA1C 6.4 03/20/2023

## 2023-03-22 NOTE — Assessment & Plan Note (Signed)
Follow cbc and iron studies.  

## 2023-03-22 NOTE — Assessment & Plan Note (Signed)
Left fourth toe pain s/p injury.  Exam as outlined.  Improved. Pain and bruising improved.  Discussed xray. Will hold given significant improvement.  Follow.

## 2023-03-22 NOTE — Assessment & Plan Note (Signed)
Continue symbicort.  Singulair.  Breathing stable.

## 2023-04-22 ENCOUNTER — Encounter: Payer: Self-pay | Admitting: Family Medicine

## 2023-04-22 ENCOUNTER — Encounter: Payer: Self-pay | Admitting: Internal Medicine

## 2023-04-22 ENCOUNTER — Ambulatory Visit: Payer: BC Managed Care – PPO | Admitting: Family Medicine

## 2023-04-22 ENCOUNTER — Ambulatory Visit: Payer: BC Managed Care – PPO

## 2023-04-22 ENCOUNTER — Ambulatory Visit (INDEPENDENT_AMBULATORY_CARE_PROVIDER_SITE_OTHER): Payer: BC Managed Care – PPO

## 2023-04-22 VITALS — BP 138/84 | HR 110 | Temp 98.2°F | Resp 20 | Ht 62.0 in | Wt 200.4 lb

## 2023-04-22 DIAGNOSIS — J988 Other specified respiratory disorders: Secondary | ICD-10-CM | POA: Diagnosis not present

## 2023-04-22 DIAGNOSIS — R059 Cough, unspecified: Secondary | ICD-10-CM | POA: Diagnosis not present

## 2023-04-22 DIAGNOSIS — R000 Tachycardia, unspecified: Secondary | ICD-10-CM

## 2023-04-22 DIAGNOSIS — R053 Chronic cough: Secondary | ICD-10-CM | POA: Diagnosis not present

## 2023-04-22 LAB — CBC WITH DIFFERENTIAL/PLATELET
Basophils Absolute: 0 10*3/uL (ref 0.0–0.1)
Basophils Relative: 0.5 % (ref 0.0–3.0)
Eosinophils Absolute: 0.2 10*3/uL (ref 0.0–0.7)
Eosinophils Relative: 1.8 % (ref 0.0–5.0)
HCT: 45 % (ref 36.0–46.0)
Hemoglobin: 14.9 g/dL (ref 12.0–15.0)
Lymphocytes Relative: 15.8 % (ref 12.0–46.0)
Lymphs Abs: 1.5 10*3/uL (ref 0.7–4.0)
MCHC: 33.2 g/dL (ref 30.0–36.0)
MCV: 85.8 fL (ref 78.0–100.0)
Monocytes Absolute: 0.7 10*3/uL (ref 0.1–1.0)
Monocytes Relative: 7.8 % (ref 3.0–12.0)
Neutro Abs: 6.8 10*3/uL (ref 1.4–7.7)
Neutrophils Relative %: 74.1 % (ref 43.0–77.0)
Platelets: 280 10*3/uL (ref 150.0–400.0)
RBC: 5.25 Mil/uL — ABNORMAL HIGH (ref 3.87–5.11)
RDW: 14.7 % (ref 11.5–15.5)
WBC: 9.2 10*3/uL (ref 4.0–10.5)

## 2023-04-22 LAB — COMPREHENSIVE METABOLIC PANEL
ALT: 20 U/L (ref 0–35)
AST: 18 U/L (ref 0–37)
Albumin: 4.6 g/dL (ref 3.5–5.2)
Alkaline Phosphatase: 89 U/L (ref 39–117)
BUN: 8 mg/dL (ref 6–23)
CO2: 27 meq/L (ref 19–32)
Calcium: 9.7 mg/dL (ref 8.4–10.5)
Chloride: 101 meq/L (ref 96–112)
Creatinine, Ser: 0.89 mg/dL (ref 0.40–1.20)
GFR: 73.18 mL/min (ref >60.00)
Glucose, Bld: 129 mg/dL — ABNORMAL HIGH (ref 70–99)
Potassium: 4 meq/L (ref 3.5–5.1)
Sodium: 139 meq/L (ref 135–145)
Total Bilirubin: 0.8 mg/dL (ref 0.2–1.2)
Total Protein: 7.6 g/dL (ref 6.0–8.3)

## 2023-04-22 MED ORDER — BREZTRI AEROSPHERE 160-9-4.8 MCG/ACT IN AERO: 2.0000 | INHALATION_SPRAY | Freq: Two times a day (BID) | RESPIRATORY_TRACT | Status: DC

## 2023-04-22 NOTE — Telephone Encounter (Signed)
 Care team updated and letter sent for eye exam notes.

## 2023-04-22 NOTE — Patient Instructions (Addendum)
It was a pleasure meeting you today. Thank you for allowing me to take part in your health care.  Our goals for today as we discussed include:  Chest xray normal  Flu A &B negative  Start Breztri 2 puffs two times a day.  Sample provided  Continue Albuterol as directed  Please let me know how you are doing tomorrow.  We will get some labs today.  If they are abnormal or we need to do something about them, I will call you.  If they are normal, I will send you a message on MyChart (if it is active) or a letter in the mail.  If you don't hear from [place] in 2 weeks, please call the office at the number below.   You can take Tylenol and/or Ibuprofen as needed for fever reduction and pain relief.   For cough: honey 1/2 to 1 teaspoon (you can dilute the honey in water or another fluid).  You can also use guaifenesin and dextromethorphan for cough. You can use a humidifier for chest congestion and cough.  If you don't have a humidifier, you can sit in the bathroom with the hot shower running.      For sore throat: try warm salt water gargles, cepacol lozenges, throat spray, warm tea or water with lemon/honey, popsicles or ice, or OTC cold relief medicine for throat discomfort.   For congestion: take a daily anti-histamine like Zyrtec, Claritin, and a oral decongestant, such as pseudoephedrine.  You can also use Flonase 1-2 sprays in each nostril daily.   It is important to stay hydrated: drink plenty of fluids (water, gatorade/powerade/pedialyte, juices, or teas) to keep your throat moisturized and help further relieve irritation/discomfort.    If symptoms worsen, develops fevers or increasing shortness of breath notify MD  This is a list of the screening recommended for you and due dates:  Health Maintenance  Topic Date Due   Zoster (Shingles) Vaccine (1 of 2) Never done   Complete foot exam   09/19/2022   Eye exam for diabetics  09/19/2022   Mammogram  12/28/2022   COVID-19 Vaccine (3 -  2024-25 season) 12/30/2022   Flu Shot  07/29/2023*   Hemoglobin A1C  09/17/2023   Yearly kidney function blood test for diabetes  03/19/2024   Yearly kidney health urinalysis for diabetes  03/19/2024   Pap with HPV screening  05/12/2027   Colon Cancer Screening  06/12/2032   Hepatitis C Screening  Completed   HIV Screening  Completed   HPV Vaccine  Aged Out   DTaP/Tdap/Td vaccine  Discontinued  *Topic was postponed. The date shown is not the original due date.     If you have any questions or concerns, please do not hesitate to call the office at [phone].  I look forward to our next visit and until then take care and stay safe.  Regards,   Dana Allan, MD   [facility]

## 2023-04-22 NOTE — Progress Notes (Unsigned)
SUBJECTIVE:   Chief Complaint  Patient presents with   Bronchitis    Started Saturday 2 covid test neg   HPI Presents for acute visit  Discussed the use of AI scribe software for clinical note transcription with the patient, who gave verbal consent to proceed.  History of Present Illness The patient, with a history of diabetes and respiratory issues, presented with a cough that began the previous day. The initial symptom was a runny nose that started two days prior to the onset of the cough. The patient reported a low-grade fever of 99.47F. She denied experiencing chest pain, shortness of breath, or wheezing. The patient has not been around anyone who was sick recently.  The patient has a history of respiratory issues and has been using an albuterol inhaler since the onset of the cough. She reported that her airways tend to get inflamed when she is sick, which can persist for a while. The patient also reported a sore throat, which she attributed to drainage, and denied any difficulty swallowing. She reported back pain due to a coughing episode.  The patient reported no difficulty lying flat, but she has been propping herself up since the onset of the cough. She denied any unexpected shortness of breath, but acknowledged that breathing has been more difficult since she fell ill. The patient's heart rate was noted to be fast, which she reported is typical when she is sick. She denied any blood clotting disorders.  The patient's diabetes has been well-controlled, with morning blood sugars usually between 108 and 120. She reported no significant changes in blood sugar levels since falling ill, but also noted a decreased appetite. The patient has been maintaining adequate hydration and denied any diarrhea.    PERTINENT PMH / PSH: As above  OBJECTIVE:  BP 138/84   Pulse (!) 110   Temp 98.2 F (36.8 C)   Resp 20   Ht 5\' 2"  (1.575 m)   Wt 200 lb 6 oz (90.9 kg)   LMP 05/05/2017   SpO2  97%   BMI 36.65 kg/m    Physical Exam Vitals reviewed.  Constitutional:      General: She is not in acute distress.    Appearance: Normal appearance. She is normal weight. She is not ill-appearing, toxic-appearing or diaphoretic.  Eyes:     General:        Right eye: No discharge.        Left eye: No discharge.     Conjunctiva/sclera: Conjunctivae normal.  Cardiovascular:     Rate and Rhythm: Regular rhythm. Tachycardia present.     Heart sounds: Normal heart sounds.  Pulmonary:     Effort: Pulmonary effort is normal.     Breath sounds: Normal breath sounds. No wheezing or rhonchi.  Abdominal:     General: Bowel sounds are normal.  Musculoskeletal:        General: Normal range of motion.  Skin:    General: Skin is warm and dry.  Neurological:     General: No focal deficit present.     Mental Status: She is alert and oriented to person, place, and time. Mental status is at baseline.  Psychiatric:        Mood and Affect: Mood normal.        Behavior: Behavior normal.        Thought Content: Thought content normal.        Judgment: Judgment normal.        04/22/2023  10:59 AM 05/11/2022    3:26 PM 01/19/2022    8:57 AM 09/18/2021    8:37 AM 03/31/2021    2:14 PM  Depression screen PHQ 2/9  Decreased Interest 0 0 0 0 0  Down, Depressed, Hopeless 0 0 0 0 0  PHQ - 2 Score 0 0 0 0 0  Altered sleeping 1      Tired, decreased energy 1      Change in appetite 0      Feeling bad or failure about yourself  0      Trouble concentrating 0      Moving slowly or fidgety/restless 0      Suicidal thoughts 0      PHQ-9 Score 2      Difficult doing work/chores Not difficult at all          04/22/2023   11:00 AM  GAD 7 : Generalized Anxiety Score  Nervous, Anxious, on Edge 0  Control/stop worrying 0  Worry too much - different things 0  Trouble relaxing 0  Restless 0  Easily annoyed or irritable 0  Afraid - awful might happen 0  Total GAD 7 Score 0  Anxiety Difficulty  Not difficult at all    ASSESSMENT/PLAN:  Respiratory tract infection Assessment & Plan: Onset of symptoms 2 days ago with rhinorrhea, progressing to cough. Low-grade fever reported. No shortness of breath or wheezing. History of respiratory issues. No abnormal lung sounds on examination. Likely viral etiology. -Order chest x-ray and blood work to rule out bacterial infection or other complications.  Orders: -     CBC with Differential/Platelet -     Comprehensive metabolic panel -     DG Chest 2 View; Future -     D-dimer, quantitative -     POCT Influenza A/B  Tachycardia Assessment & Plan: Likely secondary from URI.  No chest pain and hemodynamically stable. -Check CBC, Ddimer, Cmet   Orders: -     CBC with Differential/Platelet -     Comprehensive metabolic panel -     DG Chest 2 View; Future -     D-dimer, quantitative  Persistent cough Assessment & Plan: -Continue use of albuterol inhaler as needed. -Trial Breztri 2 puffs BID, sample provided  Orders: -     Breztri Aerosphere; Inhale 2 puffs into the lungs 2 (two) times daily.     PDMP reviewed  Return if symptoms worsen or fail to improve, for PCP.  Dana Allan, MD

## 2023-04-22 NOTE — Progress Notes (Unsigned)
Medication Samples have been provided to the patient.  Drug name: Markus Daft       Strength: 133mcg/9mcg/4.8mcg        Qty: 1  LOT: [id number]  Exp.Date: 02/27/2025  Dosing instructions: Take 2 puffs, twice daily  The patient has been instructed regarding the correct time, dose, and frequency of taking this medication, including desired effects and most common side effects.   Valentino Nose 12:34 PM 04/22/2023

## 2023-04-23 ENCOUNTER — Encounter: Payer: Self-pay | Admitting: Family Medicine

## 2023-04-23 LAB — D-DIMER, QUANTITATIVE: D-Dimer, Quant: 0.31 ug{FEU}/mL (ref <0.50)

## 2023-04-25 ENCOUNTER — Encounter: Payer: Self-pay | Admitting: Family Medicine

## 2023-04-25 DIAGNOSIS — R000 Tachycardia, unspecified: Secondary | ICD-10-CM | POA: Insufficient documentation

## 2023-04-25 DIAGNOSIS — R053 Chronic cough: Secondary | ICD-10-CM | POA: Insufficient documentation

## 2023-04-25 LAB — POCT INFLUENZA A/B
Influenza A, POC: NEGATIVE
Influenza B, POC: NEGATIVE

## 2023-04-25 NOTE — Assessment & Plan Note (Addendum)
Likely secondary from URI.  No chest pain and hemodynamically stable. -Check CBC, Ddimer, Cmet

## 2023-04-25 NOTE — Assessment & Plan Note (Signed)
Onset of symptoms 2 days ago with rhinorrhea, progressing to cough. Low-grade fever reported. No shortness of breath or wheezing. History of respiratory issues. No abnormal lung sounds on examination. Likely viral etiology. -Order chest x-ray and blood work to rule out bacterial infection or other complications.

## 2023-04-25 NOTE — Assessment & Plan Note (Signed)
-  Continue use of albuterol inhaler as needed. -Trial Breztri 2 puffs BID, sample provided

## 2023-04-26 MED ORDER — BENZONATATE 200 MG PO CAPS: 200.0000 mg | ORAL_CAPSULE | Freq: Two times a day (BID) | ORAL | 0 refills | Status: DC | PRN

## 2023-04-26 NOTE — Telephone Encounter (Signed)
This is the patient we discussed. No SOB, no chest tightness, etc. Cough is her main complaint. Please send her tessalon perles to [facility] in [place]. She said if they do not help she will keep urgent care appt made for tomorrow.

## 2023-04-27 ENCOUNTER — Ambulatory Visit
Admission: RE | Admit: 2023-04-27 | Discharge: 2023-04-27 | Disposition: A | Discharge status: Home / Self Care | Payer: BC Managed Care – PPO | Source: Ambulatory Visit | Attending: Emergency Medicine | Admitting: Emergency Medicine

## 2023-04-27 ENCOUNTER — Ambulatory Visit: Payer: BC Managed Care – PPO

## 2023-04-27 VITALS — BP 150/90 | HR 99 | Temp 98.5°F | Resp 15 | Ht 62.0 in | Wt 200.4 lb

## 2023-04-27 DIAGNOSIS — J069 Acute upper respiratory infection, unspecified: Secondary | ICD-10-CM

## 2023-04-27 MED ORDER — BENZONATATE 200 MG PO CAPS: 200.0000 mg | ORAL_CAPSULE | Freq: Two times a day (BID) | ORAL | 0 refills | Status: DC | PRN

## 2023-04-27 MED ORDER — IPRATROPIUM BROMIDE 0.06 % NA SOLN: 2.0000 | Freq: Four times a day (QID) | NASAL | 12 refills | Status: AC

## 2023-04-27 MED ORDER — PROMETHAZINE-DM 6.25-15 MG/5ML PO SYRP: 5.0000 mL | ORAL_SOLUTION | Freq: Four times a day (QID) | ORAL | 0 refills | Status: DC | PRN

## 2023-04-27 NOTE — ED Provider Notes (Signed)
[facility]    CSN: [id number] Arrival date & time: 04/27/23  1111      History   Chief Complaint Chief Complaint  Patient presents with   Cough    Appointment    HPI Donna Hensley is a 57 y.o. female.   HPI  57 year old female with a past medical history significant for GERD, hypertension, asthma, and seasonal allergies presents for evaluation of respiratory symptoms that started 5 days ago.  She was seen by her PCP at the outset and had a negative COVID, flu, and RSV testing.  She also had a chest x-ray that was negative for pneumonia or acute cardiothoracic process.  She was discharged home with a prescription for an inhaler and reports that that has not been helping.  Her cough is more prominent at night and is interrupting her sleep.  She denies any fever, shortness breath, or wheezing.  Past Medical History:  Diagnosis Date   Allergy    Asthma    GERD (gastroesophageal reflux disease)    Gestational diabetes    Hypertension     Patient Active Problem List   Diagnosis Date Noted   Tachycardia 04/25/2023   Persistent cough 04/25/2023   Toe pain, left 03/22/2023   Acute non-recurrent frontal sinusitis 01/06/2023   Rash of foot 09/17/2022   Numbness of lip 07/01/2022   Skin lesion 01/27/2022   Hypercholesterolemia 07/15/2021   Upper airway cough syndrome 01/24/2021   Chronic hepatitis C without hepatic coma (HCC) 01/12/2021   Abnormal mammogram 01/07/2021   Boil 02/29/2020   Eye irritation 06/13/2019   Conjunctivitis 03/15/2019   Left otitis media 06/23/2015   Essential hypertension 04/28/2015   Respiratory tract infection 04/18/2015   Health care maintenance 02/05/2015   Diabetes (HCC) 09/06/2013   Essential hypertension, benign 11/15/2012   Iron deficiency 10/05/2012   Mild intermittent asthma in adult without complication 06/20/2010   Thyrotoxicosis 08/24/2008   Allergic rhinitis 08/24/2008   GERD 08/24/2008    Past Surgical  History:  Procedure Laterality Date   APPENDECTOMY  1986   BREAST BIOPSY Left 08/26/2020   Affirm bx-"RIBBON" clip path pending   CESAREAN SECTION  1996 & 2000   CHOLECYSTECTOMY  2013   COLONOSCOPY WITH PROPOFOL N/A 05/05/2018   Procedure: COLONOSCOPY WITH PROPOFOL;  Surgeon: Scot Jun, MD;  Location: [facility] ENDOSCOPY;  Service: Endoscopy;  Laterality: N/A;   ESOPHAGOGASTRODUODENOSCOPY (EGD) WITH PROPOFOL N/A 05/05/2018   Procedure: ESOPHAGOGASTRODUODENOSCOPY (EGD) WITH PROPOFOL;  Surgeon: Scot Jun, MD;  Location: [facility] ENDOSCOPY;  Service: Endoscopy;  Laterality: N/A;   TONSILLECTOMY  1987   wisdom tooth removal      OB History   No obstetric history on file.      Home Medications    Prior to Admission medications   Medication Sig Start Date End Date Taking? Authorizing Provider  ipratropium (ATROVENT) 0.06 % nasal spray Place 2 sprays into both nostrils 4 (four) times daily. 04/27/23  Yes Becky Augusta, NP  promethazine-dextromethorphan (PROMETHAZINE-DM) 6.25-15 MG/5ML syrup Take 5 mLs by mouth 4 (four) times daily as needed. 04/27/23  Yes Becky Augusta, NP  albuterol (VENTOLIN HFA) 108 (90 Base) MCG/ACT inhaler Inhale 2 puffs into the lungs every 6 (six) hours as needed for wheezing. 11/09/20   Dale [place], MD  amLODipine (NORVASC) 5 MG tablet Take 1 tablet (5 mg total) by mouth daily. 03/22/23   Dale [place], MD  benzonatate (TESSALON) 200 MG capsule Take 1 capsule (200 mg total) by mouth  2 (two) times daily as needed for cough. 04/27/23   Becky Augusta, NP  Budeson-Glycopyrrol-Formoterol (BREZTRI AEROSPHERE) 160-9-4.8 MCG/ACT AERO Inhale 2 puffs into the lungs 2 (two) times daily. 04/22/23   Dana Allan, MD  famotidine (PEPCID) 20 MG tablet One after supper 01/24/21   Nyoka Cowden, MD  fluticasone [facility]) 50 MCG/ACT nasal spray Place 2 sprays into both nostrils daily. 03/22/23   Dale [place], MD  glucose blood test strip Use as instructed 12/19/16   Dale [place], MD  levocetirizine (XYZAL) 5 MG tablet Take 5 mg by mouth every evening. 02/08/16   [provider]  losartan (COZAAR) 100 MG tablet TAKE 1 TABLET DAILY 12/19/22   Dale [place], MD  metFORMIN (GLUCOPHAGE) 1000 MG tablet TAKE 1 TABLET TWICE A DAY WITH MEALS 11/02/22   Dale [place], MD  montelukast (SINGULAIR) 10 MG tablet Take 1 tablet (10 mg total) by mouth at bedtime. 05/11/22   Dale [place], MD  pantoprazole (PROTONIX) 40 MG tablet TAKE 1 TABLET DAILY 11/20/22   Dale [place], MD  rosuvastatin (CRESTOR) 5 MG tablet TAKE 1 TABLET DAILY 12/19/22   Dale [place], MD  triamcinolone cream (KENALOG) 0.1 % Apply 1 Application topically 2 (two) times daily. 10/31/22   Viviano Simas, FNP    Family History Family History  Problem Relation Age of Onset   Hyperlipidemia Mother    Osteoporosis Mother    Hypertension Father    Arthritis Father    Sleep apnea Father    Heart disease Father    Sleep apnea Brother    Dementia Maternal Grandmother    Arthritis Paternal Grandmother    Diabetes Paternal Grandmother    Migraines Cousin    Breast cancer Neg Hx     Social History Social History   Tobacco Use   Smoking status: Former    Current packs/day: 0.00    Average packs/day: 0.3 packs/day for 5.0 years (1.3 ttl pk-yrs)    Types: Cigarettes    Start date: 09/29/1983    Quit date: 09/28/1988    Years since quitting: 34.6   Smokeless tobacco: Never   Tobacco comments:    smoked briefly as a teenager  Vaping Use   Vaping status: Never Used  Substance Use Topics   Alcohol use: Yes    Alcohol/week: 1.0 standard drink of alcohol    Types: 1 Glasses of wine per week    Comment: Occasional 1-2 per month   Drug use: Never     Allergies   Doxycycline, Oxycodone, and Seasonal ic [cholestatin]   Review of Systems Review of Systems  Constitutional:  Negative for fever.  HENT:  Positive for congestion, ear pain, rhinorrhea and sore throat.   Respiratory:  Positive  for cough. Negative for shortness of breath and wheezing.      Physical Exam Triage Vital Signs ED Triage Vitals  Encounter Vitals Group     BP 04/27/23 1122 (!) 150/90     Systolic BP Percentile --      Diastolic BP Percentile --      Pulse Rate 04/27/23 1122 99     Resp 04/27/23 1122 15     Temp 04/27/23 1122 98.5 F (36.9 C)     Temp Source 04/27/23 1122 Oral     SpO2 04/27/23 1122 99 %     Weight 04/27/23 1121 200 lb 6.4 oz (90.9 kg)     Height 04/27/23 1121 5\' 2"  (1.575 m)     Head Circumference --  Peak Flow --      Pain Score 04/27/23 1121 0     Pain Loc --      Pain Education --      Exclude from Growth Chart --    No data found.  Updated Vital Signs BP (!) 150/90 (BP Location: Left Arm)   Pulse 99   Temp 98.5 F (36.9 C) (Oral)   Resp 15   Ht 5\' 2"  (1.575 m)   Wt 200 lb 6.4 oz (90.9 kg)   LMP 05/05/2017   SpO2 99%   BMI 36.65 kg/m   Visual Acuity Right Eye Distance:   Left Eye Distance:   Bilateral Distance:    Right Eye Near:   Left Eye Near:    Bilateral Near:     Physical Exam Vitals and nursing note reviewed.  Constitutional:      Appearance: Normal appearance. She is not ill-appearing.  HENT:     Head: Normocephalic and atraumatic.     Right Ear: Tympanic membrane, ear canal and external ear normal. There is no impacted cerumen.     Left Ear: Tympanic membrane, ear canal and external ear normal. There is no impacted cerumen.     Nose: Congestion and rhinorrhea present.     Comments: Nasal mucosa is mildly edematous with clear discharge in both nares.    Mouth/Throat:     Mouth: Mucous membranes are moist.     Pharynx: Oropharynx is clear. Posterior oropharyngeal erythema present. No oropharyngeal exudate.     Comments: Tonsillar pillars are surgically absent.  Mild erythema to the posterior oropharynx with clear postnasal drip. Cardiovascular:     Rate and Rhythm: Normal rate and regular rhythm.     Pulses: Normal pulses.     Heart  sounds: Normal heart sounds. No murmur heard.    No friction rub. No gallop.  Pulmonary:     Effort: Pulmonary effort is normal.     Breath sounds: Normal breath sounds. No wheezing, rhonchi or rales.  Musculoskeletal:     Cervical back: Normal range of motion and neck supple. No tenderness.  Lymphadenopathy:     Cervical: No cervical adenopathy.  Skin:    General: Skin is warm and dry.     Capillary Refill: Capillary refill takes less than 2 seconds.     Findings: No rash.  Neurological:     General: No focal deficit present.     Mental Status: She is alert and oriented to person, place, and time.      UC Treatments / Results  Labs (all labs ordered are listed, but only abnormal results are displayed) Labs Reviewed - No data to display  EKG   Radiology No results found.  Procedures Procedures (including critical care time)  Medications Ordered in UC Medications - No data to display  Initial Impression / Assessment and Plan / UC Course  I have reviewed the triage vital signs and the nursing notes.  Pertinent labs & imaging results that were available during my care of the patient were reviewed by me and considered in my medical decision making (see chart for details).   Patient is a pleasant, nontoxic-appearing 57 year old female presenting for evaluation of worsening respiratory symptoms as outlined HPI above.  She was seen 5 days ago and had negative COVID, influenza, and RSV testing.  She also did a chest x-ray that was negative for any acute cardiopulmonary process or pneumonia.  Her exam reveals inflammation of her upper respiratory tract with  clear rhinorrhea and clear postnasal drip.  Cardiopulmonary exam is benign.  I will have her continue to use her albuterol inhaler as needed for any shortness of breath or wheezing and add on Atrovent nasal spray to help with the nasal congestion and postnasal drip along with Tessalon Perles and Promethazine DM cough syrup for  cough and congestion.  Return precautions reviewed.   Final Clinical Impressions(s) / UC Diagnoses   Final diagnoses:  Viral URI with cough     Discharge Instructions      Continue to use your albuterol inhaler as needed for any shortness of breath or wheezing.  You may take 1 to 2 puffs every 4-6 hours as needed.  Use over-the-counter Tylenol and/or ibuprofen according the package instructions as needed for any fever or pain.  Use the Atrovent nasal spray, 2 squirts in each nostril every 6 hours, as needed for runny nose and postnasal drip.  Use the Tessalon Perles every 8 hours during the day.  Take them with a small sip of water.  They may give you some numbness to the base of your tongue or a metallic taste in your mouth, this is normal.  Use the Promethazine DM cough syrup at bedtime for cough and congestion.  It will make you drowsy so do not take it during the day.  Return for reevaluation or see your primary care provider for any new or worsening symptoms.      ED Prescriptions     Medication Sig Dispense Auth. Provider   benzonatate (TESSALON) 200 MG capsule Take 1 capsule (200 mg total) by mouth 2 (two) times daily as needed for cough. 20 capsule Becky Augusta, NP   ipratropium (ATROVENT) 0.06 % nasal spray Place 2 sprays into both nostrils 4 (four) times daily. 15 mL Becky Augusta, NP   promethazine-dextromethorphan (PROMETHAZINE-DM) 6.25-15 MG/5ML syrup Take 5 mLs by mouth 4 (four) times daily as needed. 118 mL Becky Augusta, NP      PDMP not reviewed this encounter.   Becky Augusta, NP 04/27/23 1134

## 2023-04-27 NOTE — Discharge Instructions (Addendum)
Continue to use your albuterol inhaler as needed for any shortness of breath or wheezing.  You may take 1 to 2 puffs every 4-6 hours as needed.  Use over-the-counter Tylenol and/or ibuprofen according the package instructions as needed for any fever or pain.  Use the Atrovent nasal spray, 2 squirts in each nostril every 6 hours, as needed for runny nose and postnasal drip.  Use the Tessalon Perles every 8 hours during the day.  Take them with a small sip of water.  They may give you some numbness to the base of your tongue or a metallic taste in your mouth, this is normal.  Use the Promethazine DM cough syrup at bedtime for cough and congestion.  It will make you drowsy so do not take it during the day.  Return for reevaluation or see your primary care provider for any new or worsening symptoms.

## 2023-04-27 NOTE — ED Triage Notes (Signed)
Patient c/o cough and chest congestion for a week.  Patient saw her PCP on Monday and had negative flu,covid, and RSV tests.  Patient was given an inhaler. Patient denies fevers.

## 2023-05-07 ENCOUNTER — Encounter: Payer: Self-pay | Admitting: Internal Medicine

## 2023-05-08 ENCOUNTER — Telehealth: Payer: BC Managed Care – PPO | Admitting: Internal Medicine

## 2023-05-08 ENCOUNTER — Encounter: Payer: Self-pay | Admitting: Internal Medicine

## 2023-05-08 DIAGNOSIS — R053 Chronic cough: Secondary | ICD-10-CM | POA: Diagnosis not present

## 2023-05-08 DIAGNOSIS — E1165 Type 2 diabetes mellitus with hyperglycemia: Secondary | ICD-10-CM | POA: Diagnosis not present

## 2023-05-08 MED ORDER — AZITHROMYCIN 250 MG PO TABS: ORAL_TABLET | ORAL | 0 refills | Status: AC

## 2023-05-08 MED ORDER — PREDNISONE 10 MG PO TABS: ORAL_TABLET | ORAL | 0 refills | Status: DC

## 2023-05-08 NOTE — Progress Notes (Signed)
 Patient ID: Donna Hensley, female   DOB: 1965-12-15, 58 y.o.   MRN: [id number]   Virtual Visit via video Note  I connected with Donna Hensley today at  8:00 AM EST by a video enabled telemedicine application and verified that I am speaking with the correct person using two identifiers. Location patient: home Location provider: work  Persons participating in the virtual visit: patient, provider  The limitations, risks, security and privacy concerns of performing an evaluation and management service by video and the availability of in person appointments have been discussed. It has also been discussed with the patient that there may be a patient responsible charge related to this service. The patient expressed understanding and agreed to proceed.   Reason for visit: work in appt  HPI: Work in appt.  Work in with concerns regarding persistent cough. Symptoms started 04/20/23 - runny nose  and cough. Low grade fever and sore throat with increased drainage. Saw Dr Hope 04/22/23 - given Breztri  and recommended continue albuterol . Called in 12/24 - prescribed tessalon  perles. Flu, covid and RSV testing - negative. CXR - negative. Reevalauted 12/28 - given atrovent  nasal spray and promethazine . She reports persistent increased cough and congestion. No fever.  Increased sinus pressure and nasal congestion.  Nasal congestion - colored - grey/yellow mucus. Increased drainage and increased sore throat. Some chest congestion now. Increased cough - coughing fits. No vomiting or diarrhea. No acid reflux. Taking mucinex. Has not been using albuterol . Denies sob. No chest pain or tightness.    ROS: See pertinent positives and negatives per HPI.  Past Medical History:  Diagnosis Date   Allergy    Asthma    GERD (gastroesophageal reflux disease)    Gestational diabetes    Hypertension     Past Surgical History:  Procedure Laterality Date   APPENDECTOMY  1986   BREAST BIOPSY Left 08/26/2020    Affirm bx-RIBBON clip path pending   CESAREAN SECTION  1996 & 2000   CHOLECYSTECTOMY  2013   COLONOSCOPY WITH PROPOFOL  N/A 05/05/2018   Procedure: COLONOSCOPY WITH PROPOFOL ;  Surgeon: Viktoria Lamar DASEN, MD;  Location: [facility] ENDOSCOPY;  Service: Endoscopy;  Laterality: N/A;   ESOPHAGOGASTRODUODENOSCOPY (EGD) WITH PROPOFOL  N/A 05/05/2018   Procedure: ESOPHAGOGASTRODUODENOSCOPY (EGD) WITH PROPOFOL ;  Surgeon: Viktoria Lamar DASEN, MD;  Location: [facility] ENDOSCOPY;  Service: Endoscopy;  Laterality: N/A;   TONSILLECTOMY  1987   wisdom tooth removal      Family History  Problem Relation Age of Onset   Hyperlipidemia Mother    Osteoporosis Mother    Hypertension Father    Arthritis Father    Sleep apnea Father    Heart disease Father    Sleep apnea Brother    Dementia Maternal Grandmother    Arthritis Paternal Grandmother    Diabetes Paternal Grandmother    Migraines Cousin    Breast cancer Neg Hx     SOCIAL HX: reviewed.    Current Outpatient Medications:    azithromycin  (ZITHROMAX ) 250 MG tablet, Take 2 tablets on day 1, then 1 tablet daily on days 2 through 5, Disp: 6 tablet, Rfl: 0   predniSONE  (DELTASONE ) 10 MG tablet, Take 6 tablets x 1 day and then decrease by 1/2 tablet per day until down to zero mg., Disp: 39 tablet, Rfl: 0   albuterol  (VENTOLIN  HFA) 108 (90 Base) MCG/ACT inhaler, Inhale 2 puffs into the lungs every 6 (six) hours as needed for wheezing., Disp: 6.7 g, Rfl: 2   amLODipine  (NORVASC ) 5  MG tablet, Take 1 tablet (5 mg total) by mouth daily., Disp: 90 tablet, Rfl: 3   benzonatate  (TESSALON ) 200 MG capsule, Take 1 capsule (200 mg total) by mouth 2 (two) times daily as needed for cough., Disp: 20 capsule, Rfl: 0   Budeson-Glycopyrrol-Formoterol  (BREZTRI  AEROSPHERE) 160-9-4.8 MCG/ACT AERO, Inhale 2 puffs into the lungs 2 (two) times daily., Disp: , Rfl:    famotidine  (PEPCID ) 20 MG tablet, One after supper, Disp: , Rfl: 0   fluticasone  (FLONASE ) 50 MCG/ACT nasal spray, Place 2  sprays into both nostrils daily., Disp: 48 g, Rfl: 3   glucose blood test strip, Use as instructed, Disp: 200 each, Rfl: 3   ipratropium (ATROVENT ) 0.06 % nasal spray, Place 2 sprays into both nostrils 4 (four) times daily., Disp: 15 mL, Rfl: 12   levocetirizine (XYZAL) 5 MG tablet, Take 5 mg by mouth every evening., Disp: , Rfl: 3   losartan  (COZAAR ) 100 MG tablet, TAKE 1 TABLET DAILY, Disp: 90 tablet, Rfl: 3   metFORMIN  (GLUCOPHAGE ) 1000 MG tablet, TAKE 1 TABLET TWICE A DAY WITH MEALS, Disp: 180 tablet, Rfl: 3   montelukast  (SINGULAIR ) 10 MG tablet, Take 1 tablet (10 mg total) by mouth at bedtime., Disp: 90 tablet, Rfl: 3   pantoprazole  (PROTONIX ) 40 MG tablet, TAKE 1 TABLET DAILY, Disp: 90 tablet, Rfl: 3   promethazine -dextromethorphan (PROMETHAZINE -DM) 6.25-15 MG/5ML syrup, Take 5 mLs by mouth 4 (four) times daily as needed., Disp: 118 mL, Rfl: 0   rosuvastatin  (CRESTOR ) 5 MG tablet, TAKE 1 TABLET DAILY, Disp: 90 tablet, Rfl: 3   triamcinolone  cream (KENALOG ) 0.1 %, Apply 1 Application topically 2 (two) times daily., Disp: 30 g, Rfl: 0  EXAM:  GENERAL: alert, oriented, appearsin no acute distress  HEENT: atraumatic, conjunttiva clear, no obvious abnormalities on inspection of external nose and ears  NECK: normal movements of the head and neck  LUNGS: on inspection no signs of respiratory distress, no obvious gross SOB, gasping or wheezing, increased cough with inhalation and forced expiration.   CV: no obvious cyanosis  PSYCH/NEURO: pleasant and cooperative, no obvious depression or anxiety, speech and thought processing grossly intact  ASSESSMENT AND PLAN:  Discussed the following assessment and plan:  Problem List Items Addressed This Visit     Diabetes (HCC) - Primary   Discussed prednisone  will increase blood sugars.  Will monitor sugar. Stay hydrated.       Persistent cough   Increased and persistent cough and congestion as outlined.  Symptoms as outlined.  Persistent  coughing fits.  Will treat with zpak as directed.  Prednisone  taper as directed. Saline nasal spray and flonase .  Continue mucinex.  Albuterol  prn. Follow.  Call with update. Recent CXR negative.        Return if symptoms worsen or fail to improve.   I discussed the assessment and treatment plan with the patient. The patient was provided an opportunity to ask questions and all were answered. The patient agreed with the plan and demonstrated an understanding of the instructions.   The patient was advised to call back or seek an in-person evaluation if the symptoms worsen or if the condition fails to improve as anticipated.   Allena Hamilton, MD

## 2023-05-08 NOTE — Assessment & Plan Note (Signed)
 Increased and persistent cough and congestion as outlined.  Symptoms as outlined.  Persistent coughing fits.  Will treat with zpak as directed.  Prednisone  taper as directed. Saline nasal spray and flonase .  Continue mucinex.  Albuterol  prn. Follow.  Call with update. Recent CXR negative.

## 2023-05-08 NOTE — Telephone Encounter (Signed)
Pt scheduled with Dr Scott to discuss. 

## 2023-05-08 NOTE — Assessment & Plan Note (Signed)
 Discussed prednisone will increase blood sugars.  Will monitor sugar. Stay hydrated.

## 2023-05-13 ENCOUNTER — Encounter: Payer: Self-pay | Admitting: Internal Medicine

## 2023-05-13 DIAGNOSIS — J40 Bronchitis, not specified as acute or chronic: Secondary | ICD-10-CM

## 2023-05-14 MED ORDER — ALBUTEROL SULFATE HFA 108 (90 BASE) MCG/ACT IN AERS: 2.0000 | INHALATION_SPRAY | Freq: Four times a day (QID) | RESPIRATORY_TRACT | 2 refills | Status: AC | PRN

## 2023-05-14 NOTE — Telephone Encounter (Signed)
 Pt scheduled to see Dr Lorin Picket. Inhaler refilled.

## 2023-05-14 NOTE — Telephone Encounter (Signed)
 Ok to refill inhaler.  If persistent increased symptoms, will need to be reevaluated.

## 2023-05-15 ENCOUNTER — Ambulatory Visit
Admission: RE | Admit: 2023-05-15 | Discharge: 2023-05-15 | Disposition: A | Discharge status: Home / Self Care | Payer: BC Managed Care – PPO | Source: Ambulatory Visit | Attending: Internal Medicine | Admitting: Internal Medicine

## 2023-05-15 DIAGNOSIS — Z1231 Encounter for screening mammogram for malignant neoplasm of breast: Secondary | ICD-10-CM | POA: Insufficient documentation

## 2023-05-16 ENCOUNTER — Ambulatory Visit: Payer: BC Managed Care – PPO | Admitting: Internal Medicine

## 2023-05-16 VITALS — BP 130/74 | HR 90 | Temp 98.4°F | Resp 16 | Ht 62.0 in | Wt 200.0 lb

## 2023-05-16 DIAGNOSIS — E7800 Pure hypercholesterolemia, unspecified: Secondary | ICD-10-CM | POA: Diagnosis not present

## 2023-05-16 DIAGNOSIS — R053 Chronic cough: Secondary | ICD-10-CM

## 2023-05-16 DIAGNOSIS — I10 Essential (primary) hypertension: Secondary | ICD-10-CM

## 2023-05-16 DIAGNOSIS — K219 Gastro-esophageal reflux disease without esophagitis: Secondary | ICD-10-CM | POA: Diagnosis not present

## 2023-05-16 DIAGNOSIS — E1165 Type 2 diabetes mellitus with hyperglycemia: Secondary | ICD-10-CM

## 2023-05-16 MED ORDER — PREDNISONE 10 MG PO TABS: ORAL_TABLET | ORAL | 0 refills | Status: DC

## 2023-05-16 MED ORDER — PROMETHAZINE-DM 6.25-15 MG/5ML PO SYRP: 5.0000 mL | ORAL_SOLUTION | Freq: Two times a day (BID) | ORAL | 0 refills | Status: DC | PRN

## 2023-05-16 NOTE — Progress Notes (Signed)
Subjective:    Patient ID: Donna Hensley, female    DOB: 1965/08/03, 58 y.o.   MRN: [id number]  Patient here for  Chief Complaint  Patient presents with   URI    HPI Work in appt - work in for persistent cough/congestion. Work in with concerns regarding persistent cough. Symptoms started 04/20/23 - runny nose and cough. Low grade fever and sore throat with increased drainage. Saw Dr Clent Ridges 04/22/23 - given Breztri and recommended continue albuterol. Called in 12/24 - prescribed tessalon perles. Flu, covid and RSV testing - negative. CXR - negative. Reevalauted 12/28 - given atrovent nasal spray and promethazine. Reevaluated 05/08/23 - treated with zpak and prednisone taper. Reports she does feel better, but symptoms have persisted. Still with increased cough. Some colored mucus. Drainage is better. Taking mucinex DM. Using flonase. Using albuterol tid.    Past Medical History:  Diagnosis Date   Allergy    Asthma    GERD (gastroesophageal reflux disease)    Gestational diabetes    Hypertension    Past Surgical History:  Procedure Laterality Date   APPENDECTOMY  1986   BREAST BIOPSY Left 08/26/2020   Affirm bx-"RIBBON" clip path pending   CESAREAN SECTION  1996 & 2000   CHOLECYSTECTOMY  2013   COLONOSCOPY WITH PROPOFOL N/A 05/05/2018   Procedure: COLONOSCOPY WITH PROPOFOL;  Surgeon: Scot Jun, MD;  Location: [facility] ENDOSCOPY;  Service: Endoscopy;  Laterality: N/A;   ESOPHAGOGASTRODUODENOSCOPY (EGD) WITH PROPOFOL N/A 05/05/2018   Procedure: ESOPHAGOGASTRODUODENOSCOPY (EGD) WITH PROPOFOL;  Surgeon: Scot Jun, MD;  Location: [facility] ENDOSCOPY;  Service: Endoscopy;  Laterality: N/A;   TONSILLECTOMY  1987   wisdom tooth removal     Family History  Problem Relation Age of Onset   Hyperlipidemia Mother    Osteoporosis Mother    Hypertension Father    Arthritis Father    Sleep apnea Father    Heart disease Father    Sleep apnea Brother    Dementia Maternal Grandmother     Arthritis Paternal Grandmother    Diabetes Paternal Grandmother    Migraines Cousin    Breast cancer Neg Hx    Social History   Socioeconomic History   Marital status: Married    Spouse name: Not on file   Number of children: 2   Years of education: Not on file   Highest education level: Associate degree: occupational, Scientist, product/process development, or vocational program  Occupational History   Occupation: Therapist, occupational: [employer] [place]  Tobacco Use   Smoking status: Former    Current packs/day: 0.00    Average packs/day: 0.3 packs/day for 5.0 years (1.3 ttl pk-yrs)    Types: Cigarettes    Start date: 09/29/1983    Quit date: 09/28/1988    Years since quitting: 34.6   Smokeless tobacco: Never   Tobacco comments:    smoked briefly as a teenager  Vaping Use   Vaping status: Never Used  Substance and Sexual Activity   Alcohol use: Yes    Alcohol/week: 1.0 standard drink of alcohol    Types: 1 Glasses of wine per week    Comment: Occasional 1-2 per month   Drug use: Never   Sexual activity: Not on file  Other Topics Concern   Not on file  Social History Narrative   Regular exercise-yes, walk   Diet: fast food, trying to improve diet         Social Drivers of Corporate investment banker  Strain: Low Risk  (05/15/2023)   Overall Financial Resource Strain (CARDIA)    Difficulty of Paying Living Expenses: Not very hard  Food Insecurity: No Food Insecurity (05/15/2023)   Hunger Vital Sign    Worried About Running Out of Food in the Last Year: Never true    Ran Out of Food in the Last Year: Never true  Transportation Needs: No Transportation Needs (05/15/2023)   PRAPARE - Administrator, Civil Service (Medical): No    Lack of Transportation (Non-Medical): No  Physical Activity: Insufficiently Active (05/15/2023)   Exercise Vital Sign    Days of Exercise per Week: 4 days    Minutes of Exercise per Session: 20 min  Stress: No Stress Concern Present (05/15/2023)   [employer] of Occupational Health - Occupational Stress Questionnaire    Feeling of Stress : Only a little  Social Connections: Unknown (05/15/2023)   Social Connection and Isolation Panel [NHANES]    Frequency of Communication with Friends and Family: More than three times a week    Frequency of Social Gatherings with Friends and Family: More than three times a week    Attends Religious Services: Patient declined    Database administrator or Organizations: No    Attends Engineer, structural: Not on file    Marital Status: Married     Review of Systems  Constitutional:  Negative for appetite change, fever and unexpected weight change.  HENT:  Positive for congestion and postnasal drip.   Respiratory:  Positive for cough. Negative for chest tightness.   Cardiovascular:  Negative for chest pain and palpitations.  Gastrointestinal:  Negative for abdominal pain, nausea and vomiting.  Genitourinary:  Negative for difficulty urinating and dysuria.  Musculoskeletal:  Negative for joint swelling and myalgias.  Skin:  Negative for color change and rash.  Neurological:  Negative for dizziness and headaches.  Psychiatric/Behavioral:  Negative for agitation and dysphoric mood.        Objective:     BP 130/74   Pulse 90   Temp 98.4 F (36.9 C)   Resp 16   Ht 5\' 2"  (1.575 m)   Wt 200 lb (90.7 kg)   LMP 05/05/2017   SpO2 98%   BMI 36.58 kg/m  Wt Readings from Last 3 Encounters:  05/16/23 200 lb (90.7 kg)  04/27/23 200 lb 6.4 oz (90.9 kg)  04/22/23 200 lb 6 oz (90.9 kg)    Physical Exam Vitals reviewed.  Constitutional:      General: She is not in acute distress.    Appearance: Normal appearance.  HENT:     Head: Normocephalic and atraumatic.     Right Ear: External ear normal.     Left Ear: External ear normal.  Eyes:     General: No scleral icterus.       Right eye: No discharge.        Left eye: No discharge.     Conjunctiva/sclera: Conjunctivae normal.  Neck:      Thyroid: No thyromegaly.  Cardiovascular:     Rate and Rhythm: Normal rate and regular rhythm.  Pulmonary:     Effort: No respiratory distress.     Comments: Increased cough with forced expiration.  Abdominal:     General: Bowel sounds are normal.     Palpations: Abdomen is soft.     Tenderness: There is no abdominal tenderness.  Musculoskeletal:        General: No swelling or tenderness.  Cervical back: Neck supple. No tenderness.  Lymphadenopathy:     Cervical: No cervical adenopathy.  Skin:    Findings: No erythema or rash.  Neurological:     Mental Status: She is alert.  Psychiatric:        Mood and Affect: Mood normal.        Behavior: Behavior normal.         Outpatient Encounter Medications as of 05/16/2023  Medication Sig   albuterol (VENTOLIN HFA) 108 (90 Base) MCG/ACT inhaler Inhale 2 puffs into the lungs every 6 (six) hours as needed for wheezing.   amLODipine (NORVASC) 5 MG tablet Take 1 tablet (5 mg total) by mouth daily.   famotidine (PEPCID) 20 MG tablet One after supper   fluticasone (FLONASE) 50 MCG/ACT nasal spray Place 2 sprays into both nostrils daily.   glucose blood test strip Use as instructed   ipratropium (ATROVENT) 0.06 % nasal spray Place 2 sprays into both nostrils 4 (four) times daily.   levocetirizine (XYZAL) 5 MG tablet Take 5 mg by mouth every evening.   losartan (COZAAR) 100 MG tablet TAKE 1 TABLET DAILY   metFORMIN (GLUCOPHAGE) 1000 MG tablet TAKE 1 TABLET TWICE A DAY WITH MEALS   montelukast (SINGULAIR) 10 MG tablet Take 1 tablet (10 mg total) by mouth at bedtime.   pantoprazole (PROTONIX) 40 MG tablet TAKE 1 TABLET DAILY   predniSONE (DELTASONE) 10 MG tablet Take 6 tablets x 1 day and then decrease by 1/2 tablet per day until down to zero mg.   promethazine-dextromethorphan (PROMETHAZINE-DM) 6.25-15 MG/5ML syrup Take 5 mLs by mouth 2 (two) times daily as needed.   rosuvastatin (CRESTOR) 5 MG tablet TAKE 1 TABLET DAILY    triamcinolone cream (KENALOG) 0.1 % Apply 1 Application topically 2 (two) times daily.   [DISCONTINUED] benzonatate (TESSALON) 200 MG capsule Take 1 capsule (200 mg total) by mouth 2 (two) times daily as needed for cough.   [DISCONTINUED] Budeson-Glycopyrrol-Formoterol (BREZTRI AEROSPHERE) 160-9-4.8 MCG/ACT AERO Inhale 2 puffs into the lungs 2 (two) times daily.   [DISCONTINUED] predniSONE (DELTASONE) 10 MG tablet Take 6 tablets x 1 day and then decrease by 1/2 tablet per day until down to zero mg.   [DISCONTINUED] promethazine-dextromethorphan (PROMETHAZINE-DM) 6.25-15 MG/5ML syrup Take 5 mLs by mouth 4 (four) times daily as needed.   No facility-administered encounter medications on file as of 05/16/2023.     Lab Results  Component Value Date   WBC 9.2 04/22/2023   HGB 14.9 04/22/2023   HCT 45.0 04/22/2023   PLT 280.0 04/22/2023   GLUCOSE 129 (H) 04/22/2023   CHOL 129 03/20/2023   TRIG 78.0 03/20/2023   HDL 59.10 03/20/2023   LDLCALC 55 03/20/2023   ALT 20 04/22/2023   AST 18 04/22/2023   NA 139 04/22/2023   K 4.0 04/22/2023   CL 101 04/22/2023   CREATININE 0.89 04/22/2023   BUN 8 04/22/2023   CO2 27 04/22/2023   TSH 1.69 05/10/2022   INR 0.9 01/12/2021   HGBA1C 6.4 03/20/2023   MICROALBUR 4.4 (H) 03/20/2023       Assessment & Plan:  Type 2 diabetes mellitus with hyperglycemia, without long-term current use of insulin (HCC) Assessment & Plan: Prednisone can increase blood sugars.  Stay hydrated.    Essential hypertension Assessment & Plan: On losartan and amlodipine 5mg  q day. Blood pressure as outlined.  Follow pressures.  Follow metabolic panel. No changes.    Gastroesophageal reflux disease without esophagitis Assessment & Plan: No  increased acid reflux.  Protonix.    Hypercholesterolemia Assessment & Plan: Tolerating crestor.  Low cholesterol diet and exercise.  Follow lipid panel and liver function tests.     Persistent cough Assessment &  Plan: Increased and persistent cough and congestion as outlined.  Symptoms as outlined.  Persistent coughing fits. Discussed giving another course of prednisone given increased cough. Prednisone taper as directed. Continues saline nasal spray and flonase.  Continue mucinex.  Albuterol prn. Follow.  Call with update. Recent CXR negative.    Other orders -     Promethazine-DM; Take 5 mLs by mouth 2 (two) times daily as needed.  Dispense: 118 mL; Refill: 0 -     predniSONE; Take 6 tablets x 1 day and then decrease by 1/2 tablet per day until down to zero mg.  Dispense: 39 tablet; Refill: 0     Dale [place], MD

## 2023-05-19 ENCOUNTER — Encounter: Payer: Self-pay | Admitting: Internal Medicine

## 2023-05-19 NOTE — Assessment & Plan Note (Signed)
 No increased acid reflux.  Protonix.

## 2023-05-19 NOTE — Assessment & Plan Note (Signed)
 Tolerating crestor.  Low cholesterol diet and exercise.  Follow lipid panel and liver function tests.

## 2023-05-19 NOTE — Assessment & Plan Note (Signed)
Increased and persistent cough and congestion as outlined.  Symptoms as outlined.  Persistent coughing fits. Discussed giving another course of prednisone given increased cough. Prednisone taper as directed. Continues saline nasal spray and flonase.  Continue mucinex.  Albuterol prn. Follow.  Call with update. Recent CXR negative.

## 2023-05-19 NOTE — Assessment & Plan Note (Signed)
Prednisone can increase blood sugars.  Stay hydrated.

## 2023-05-19 NOTE — Assessment & Plan Note (Signed)
 On losartan and amlodipine 5mg  q day. Blood pressure as outlined.  Follow pressures.  Follow metabolic panel. No changes.

## 2023-05-27 NOTE — Telephone Encounter (Signed)
Discussed this with you about work in. We have a couple of openings end of week. She has 3 days of prednisone left.

## 2023-05-27 NOTE — Telephone Encounter (Signed)
Can work in tomorrow at 4:00 05/28/23.

## 2023-05-27 NOTE — Telephone Encounter (Signed)
LM to schedule

## 2023-05-28 ENCOUNTER — Telehealth: Payer: BC Managed Care – PPO | Admitting: Internal Medicine

## 2023-05-28 DIAGNOSIS — I10 Essential (primary) hypertension: Secondary | ICD-10-CM | POA: Diagnosis not present

## 2023-05-28 DIAGNOSIS — R053 Chronic cough: Secondary | ICD-10-CM | POA: Diagnosis not present

## 2023-05-28 DIAGNOSIS — E1165 Type 2 diabetes mellitus with hyperglycemia: Secondary | ICD-10-CM | POA: Diagnosis not present

## 2023-05-28 DIAGNOSIS — J45909 Unspecified asthma, uncomplicated: Secondary | ICD-10-CM

## 2023-05-28 MED ORDER — ALBUTEROL SULFATE (2.5 MG/3ML) 0.083% IN NEBU
2.5000 mg | INHALATION_SOLUTION | Freq: Four times a day (QID) | RESPIRATORY_TRACT | 1 refills | Status: AC | PRN
Start: 2023-05-28 — End: ?

## 2023-05-28 MED ORDER — AMOXICILLIN-POT CLAVULANATE 875-125 MG PO TABS: 1.0000 | ORAL_TABLET | Freq: Two times a day (BID) | ORAL | 0 refills | Status: DC

## 2023-05-28 NOTE — Progress Notes (Deleted)
Subjective:    Patient ID: Donna Hensley, female    DOB: 05/17/1965, 58 y.o.   MRN: [id number]  Patient here for  Chief Complaint  Patient presents with   Sinus Problem    HPI Here for work in appt - work in with persistent cough. Symptoms started 04/20/23 - runny nose and cough. Low grade fever and sore throat with increased drainage. Saw Dr Clent Ridges 04/22/23 - given Breztri and recommended continue albuterol. Called in 12/24 - prescribed tessalon perles. Flu, covid and RSV testing - negative. CXR - negative. Reevalauted 12/28 - given atrovent nasal spray and promethazine. Reevaluated 05/08/23 - treated with zpak and prednisone taper.    Past Medical History:  Diagnosis Date   Allergy    Asthma    GERD (gastroesophageal reflux disease)    Gestational diabetes    Hypertension    Past Surgical History:  Procedure Laterality Date   APPENDECTOMY  1986   BREAST BIOPSY Left 08/26/2020   Affirm bx-"RIBBON" clip path pending   CESAREAN SECTION  1996 & 2000   CHOLECYSTECTOMY  2013   COLONOSCOPY WITH PROPOFOL N/A 05/05/2018   Procedure: COLONOSCOPY WITH PROPOFOL;  Surgeon: Scot Jun, MD;  Location: [facility] ENDOSCOPY;  Service: Endoscopy;  Laterality: N/A;   ESOPHAGOGASTRODUODENOSCOPY (EGD) WITH PROPOFOL N/A 05/05/2018   Procedure: ESOPHAGOGASTRODUODENOSCOPY (EGD) WITH PROPOFOL;  Surgeon: Scot Jun, MD;  Location: [facility] ENDOSCOPY;  Service: Endoscopy;  Laterality: N/A;   TONSILLECTOMY  1987   wisdom tooth removal     Family History  Problem Relation Age of Onset   Hyperlipidemia Mother    Osteoporosis Mother    Hypertension Father    Arthritis Father    Sleep apnea Father    Heart disease Father    Sleep apnea Brother    Dementia Maternal Grandmother    Arthritis Paternal Grandmother    Diabetes Paternal Grandmother    Migraines Cousin    Breast cancer Neg Hx    Social History   Socioeconomic History   Marital status: Married    Spouse name: Not on file    Number of children: 2   Years of education: Not on file   Highest education level: Associate degree: occupational, Scientist, product/process development, or vocational program  Occupational History   Occupation: Therapist, occupational: [employer] [place]  Tobacco Use   Smoking status: Former    Current packs/day: 0.00    Average packs/day: 0.3 packs/day for 5.0 years (1.3 ttl pk-yrs)    Types: Cigarettes    Start date: 09/29/1983    Quit date: 09/28/1988    Years since quitting: 34.6   Smokeless tobacco: Never   Tobacco comments:    smoked briefly as a teenager  Vaping Use   Vaping status: Never Used  Substance and Sexual Activity   Alcohol use: Yes    Alcohol/week: 1.0 standard drink of alcohol    Types: 1 Glasses of wine per week    Comment: Occasional 1-2 per month   Drug use: Never   Sexual activity: Not on file  Other Topics Concern   Not on file  Social History Narrative   Regular exercise-yes, walk   Diet: fast food, trying to improve diet         Social Drivers of Health   Financial Resource Strain: Low Risk  (05/15/2023)   Overall Financial Resource Strain (CARDIA)    Difficulty of Paying Living Expenses: Not very hard  Food Insecurity: No Food Insecurity (05/15/2023)  Hunger Vital Sign    Worried About Running Out of Food in the Last Year: Never true    Ran Out of Food in the Last Year: Never true  Transportation Needs: No Transportation Needs (05/15/2023)   PRAPARE - Administrator, Civil Service (Medical): No    Lack of Transportation (Non-Medical): No  Physical Activity: Insufficiently Active (05/15/2023)   Exercise Vital Sign    Days of Exercise per Week: 4 days    Minutes of Exercise per Session: 20 min  Stress: No Stress Concern Present (05/15/2023)   [employer] of Occupational Health - Occupational Stress Questionnaire    Feeling of Stress : Only a little  Social Connections: Unknown (05/15/2023)   Social Connection and Isolation Panel [NHANES]    Frequency of  Communication with Friends and Family: More than three times a week    Frequency of Social Gatherings with Friends and Family: More than three times a week    Attends Religious Services: Patient declined    Active Member of Clubs or Organizations: No    Attends Banker Meetings: Not on file    Marital Status: Married     Review of Systems     Objective:     LMP 05/05/2017  Wt Readings from Last 3 Encounters:  05/16/23 200 lb (90.7 kg)  04/27/23 200 lb 6.4 oz (90.9 kg)  04/22/23 200 lb 6 oz (90.9 kg)    Physical Exam  {Perform Simple Foot Exam  Perform Detailed exam:1} {Insert foot Exam (Optional):30965}   Outpatient Encounter Medications as of 05/28/2023  Medication Sig   albuterol (VENTOLIN HFA) 108 (90 Base) MCG/ACT inhaler Inhale 2 puffs into the lungs every 6 (six) hours as needed for wheezing.   amLODipine (NORVASC) 5 MG tablet Take 1 tablet (5 mg total) by mouth daily.   famotidine (PEPCID) 20 MG tablet One after supper   fluticasone (FLONASE) 50 MCG/ACT nasal spray Place 2 sprays into both nostrils daily.   glucose blood test strip Use as instructed   ipratropium (ATROVENT) 0.06 % nasal spray Place 2 sprays into both nostrils 4 (four) times daily.   levocetirizine (XYZAL) 5 MG tablet Take 5 mg by mouth every evening.   losartan (COZAAR) 100 MG tablet TAKE 1 TABLET DAILY   metFORMIN (GLUCOPHAGE) 1000 MG tablet TAKE 1 TABLET TWICE A DAY WITH MEALS   montelukast (SINGULAIR) 10 MG tablet Take 1 tablet (10 mg total) by mouth at bedtime.   pantoprazole (PROTONIX) 40 MG tablet TAKE 1 TABLET DAILY   promethazine-dextromethorphan (PROMETHAZINE-DM) 6.25-15 MG/5ML syrup Take 5 mLs by mouth 2 (two) times daily as needed.   rosuvastatin (CRESTOR) 5 MG tablet TAKE 1 TABLET DAILY   triamcinolone cream (KENALOG) 0.1 % Apply 1 Application topically 2 (two) times daily.   [DISCONTINUED] predniSONE (DELTASONE) 10 MG tablet Take 6 tablets x 1 day and then decrease by 1/2  tablet per day until down to zero mg.   No facility-administered encounter medications on file as of 05/28/2023.     Lab Results  Component Value Date   WBC 9.2 04/22/2023   HGB 14.9 04/22/2023   HCT 45.0 04/22/2023   PLT 280.0 04/22/2023   GLUCOSE 129 (H) 04/22/2023   CHOL 129 03/20/2023   TRIG 78.0 03/20/2023   HDL 59.10 03/20/2023   LDLCALC 55 03/20/2023   ALT 20 04/22/2023   AST 18 04/22/2023   NA 139 04/22/2023   K 4.0 04/22/2023   CL 101 04/22/2023  CREATININE 0.89 04/22/2023   BUN 8 04/22/2023   CO2 27 04/22/2023   TSH 1.69 05/10/2022   INR 0.9 01/12/2021   HGBA1C 6.4 03/20/2023   MICROALBUR 4.4 (H) 03/20/2023    MM 3D SCREENING MAMMOGRAM BILATERAL BREAST Result Date: 05/16/2023 CLINICAL DATA:  Screening. EXAM: DIGITAL SCREENING BILATERAL MAMMOGRAM WITH TOMOSYNTHESIS AND CAD TECHNIQUE: Bilateral screening digital craniocaudal and mediolateral oblique mammograms were obtained. Bilateral screening digital breast tomosynthesis was performed. The images were evaluated with computer-aided detection. COMPARISON:  Previous exam(s). ACR Breast Density Category b: There are scattered areas of fibroglandular density. FINDINGS: There are no findings suspicious for malignancy. IMPRESSION: No mammographic evidence of malignancy. A result letter of this screening mammogram will be mailed directly to the patient. RECOMMENDATION: Screening mammogram in one year. (Code:SM-B-01Y) BI-RADS CATEGORY  1: Negative. Electronically Signed   By: Meda Klinefelter M.D.   On: 05/16/2023 08:50       Assessment & Plan:  There are no diagnoses linked to this encounter.   Dale [place], MD

## 2023-05-28 NOTE — Telephone Encounter (Signed)
Patient scheduled.

## 2023-06-02 ENCOUNTER — Encounter: Payer: Self-pay | Admitting: Internal Medicine

## 2023-06-02 NOTE — Assessment & Plan Note (Signed)
Prednisone can increase blood sugars.  Stay hydrated. Tapering off.  Hold on further prednisone at this time. Follow sugars.

## 2023-06-02 NOTE — Assessment & Plan Note (Signed)
On losartan and amlodipine 5mg  q day. Follow pressures.

## 2023-06-02 NOTE — Progress Notes (Signed)
Patient ID: Donna Hensley, female   DOB: 01/05/1966, 58 y.o.   MRN: [id number]   Virtual Visit via video Note  I connected with Donna Hensley by a video enabled telemedicine application or telephone and verified that I am speaking with the correct person using two identifiers. Location patient: home Location provider: work  Persons participating in the virtual visit: patient, provider  The limitations, risks, security and privacy concerns of performing an evaluation and management service by video and the availability of in person appointments have been discussed. It has also been discussed with the patient that there may be a patient responsible charge related to this service. The patient expressed understanding and agreed to proceed.   Reason for visit: work in appt  HPI: Here for work in appt - work in with persistent cough. Symptoms started 04/20/23 - runny nose and cough. Low grade fever and sore throat with increased drainage. Saw Dr Clent Ridges 04/22/23 - given Breztri and recommended continue albuterol. Called in 12/24 - prescribed tessalon perles. Flu, covid and RSV testing - negative. CXR - negative. Reevalauted 12/28 - given atrovent nasal spray and promethazine. Reevaluated 05/08/23 - treated with zpak and prednisone taper. Cough is better. No chest tightness. Has noticed recently some increased sinus congestion and sinus pressure - productive of colored mucus - brown/yellow mucus. Using albuterol. Has symbicort. Feels previous neb helped. Does not have machine at home. No increased sob. No vomiting or diarrhea.    ROS: See pertinent positives and negatives per HPI.  Past Medical History:  Diagnosis Date   Allergy    Asthma    GERD (gastroesophageal reflux disease)    Gestational diabetes    Hypertension     Past Surgical History:  Procedure Laterality Date   APPENDECTOMY  1986   BREAST BIOPSY Left 08/26/2020   Affirm bx-"RIBBON" clip path pending   CESAREAN SECTION  1996  & 2000   CHOLECYSTECTOMY  2013   COLONOSCOPY WITH PROPOFOL N/A 05/05/2018   Procedure: COLONOSCOPY WITH PROPOFOL;  Surgeon: Scot Jun, MD;  Location: [facility] ENDOSCOPY;  Service: Endoscopy;  Laterality: N/A;   ESOPHAGOGASTRODUODENOSCOPY (EGD) WITH PROPOFOL N/A 05/05/2018   Procedure: ESOPHAGOGASTRODUODENOSCOPY (EGD) WITH PROPOFOL;  Surgeon: Scot Jun, MD;  Location: [facility] ENDOSCOPY;  Service: Endoscopy;  Laterality: N/A;   TONSILLECTOMY  1987   wisdom tooth removal      Family History  Problem Relation Age of Onset   Hyperlipidemia Mother    Osteoporosis Mother    Hypertension Father    Arthritis Father    Sleep apnea Father    Heart disease Father    Sleep apnea Brother    Dementia Maternal Grandmother    Arthritis Paternal Grandmother    Diabetes Paternal Grandmother    Migraines Cousin    Breast cancer Neg Hx     SOCIAL HX: reviewed.    Current Outpatient Medications:    albuterol (PROVENTIL) (2.5 MG/3ML) 0.083% nebulizer solution, Take 3 mLs (2.5 mg total) by nebulization every 6 (six) hours as needed for wheezing or shortness of breath., Disp: 150 mL, Rfl: 1   amoxicillin-clavulanate (AUGMENTIN) 875-125 MG tablet, Take 1 tablet by mouth 2 (two) times daily., Disp: 14 tablet, Rfl: 0   albuterol (VENTOLIN HFA) 108 (90 Base) MCG/ACT inhaler, Inhale 2 puffs into the lungs every 6 (six) hours as needed for wheezing., Disp: 6.7 g, Rfl: 2   amLODipine (NORVASC) 5 MG tablet, Take 1 tablet (5 mg total) by mouth daily., Disp: 90 tablet,  Rfl: 3   famotidine (PEPCID) 20 MG tablet, One after supper, Disp: , Rfl: 0   fluticasone (FLONASE) 50 MCG/ACT nasal spray, Place 2 sprays into both nostrils daily., Disp: 48 g, Rfl: 3   glucose blood test strip, Use as instructed, Disp: 200 each, Rfl: 3   ipratropium (ATROVENT) 0.06 % nasal spray, Place 2 sprays into both nostrils 4 (four) times daily., Disp: 15 mL, Rfl: 12   levocetirizine (XYZAL) 5 MG tablet, Take 5 mg by mouth every  evening., Disp: , Rfl: 3   losartan (COZAAR) 100 MG tablet, TAKE 1 TABLET DAILY, Disp: 90 tablet, Rfl: 3   metFORMIN (GLUCOPHAGE) 1000 MG tablet, TAKE 1 TABLET TWICE A DAY WITH MEALS, Disp: 180 tablet, Rfl: 3   montelukast (SINGULAIR) 10 MG tablet, Take 1 tablet (10 mg total) by mouth at bedtime., Disp: 90 tablet, Rfl: 3   pantoprazole (PROTONIX) 40 MG tablet, TAKE 1 TABLET DAILY, Disp: 90 tablet, Rfl: 3   promethazine-dextromethorphan (PROMETHAZINE-DM) 6.25-15 MG/5ML syrup, Take 5 mLs by mouth 2 (two) times daily as needed., Disp: 118 mL, Rfl: 0   rosuvastatin (CRESTOR) 5 MG tablet, TAKE 1 TABLET DAILY, Disp: 90 tablet, Rfl: 3   triamcinolone cream (KENALOG) 0.1 %, Apply 1 Application topically 2 (two) times daily., Disp: 30 g, Rfl: 0  EXAM:  GENERAL: alert, oriented, appears well and in no acute distress  HEENT: atraumatic, conjunttiva clear, no obvious abnormalities on inspection of external nose and ears  NECK: normal movements of the head and neck  LUNGS:  no signs of respiratory distress, breathing rate appears normal, no obvious gross SOB, gasping or wheezing  CV: no obvious cyanosis  PSYCH/NEURO: pleasant and cooperative, no obvious depression or anxiety, speech and thought processing grossly intact  ASSESSMENT AND PLAN:  Discussed the following assessment and plan:  Problem List Items Addressed This Visit     Persistent cough - Primary   Persistent cough and congestion.  Cough is better as outlined. Has noticed more increased sinus congestion and colored nasal mucus. No chest tightness. Continuing on prednisone taper. Will continue inhaler as she is doing. Albuterol neb as directed. Given concern regarding secondary bacterial infection, will cover with augmentin. Continue saline nasal spray ans steroid nasal spray as directed. Follow. Call with update.  Discussed f/u with pulmonary given persistent issues.       Relevant Orders   Ambulatory referral to Pulmonology    Essential hypertension   On losartan and amlodipine 5mg  q day. Follow pressures.        Diabetes (HCC)   Prednisone can increase blood sugars.  Stay hydrated. Tapering off.  Hold on further prednisone at this time. Follow sugars.       Other Visit Diagnoses       Asthma, unspecified asthma severity, unspecified whether complicated, unspecified whether persistent       Relevant Medications   albuterol (PROVENTIL) (2.5 MG/3ML) 0.083% nebulizer solution   Other Relevant Orders   For home use only DME Nebulizer machine       Return if symptoms worsen or fail to improve.   I discussed the assessment and treatment plan with the patient. The patient was provided an opportunity to ask questions and all were answered. The patient agreed with the plan and demonstrated an understanding of the instructions.   The patient was advised to call back or seek an in-person evaluation if the symptoms worsen or if the condition fails to improve as anticipated.    Kemora Pinard  Lorin Picket, MD

## 2023-06-02 NOTE — Assessment & Plan Note (Signed)
Persistent cough and congestion.  Cough is better as outlined. Has noticed more increased sinus congestion and colored nasal mucus. No chest tightness. Continuing on prednisone taper. Will continue inhaler as she is doing. Albuterol neb as directed. Given concern regarding secondary bacterial infection, will cover with augmentin. Continue saline nasal spray ans steroid nasal spray as directed. Follow. Call with update.  Discussed f/u with pulmonary given persistent issues.

## 2023-06-03 NOTE — Telephone Encounter (Signed)
I do not mind seeing her. She has seen pulmonary. I have put in a new referral, but she should not need a new referral given has been < 3 years. If agreeable, see if pulmonary can see her, given we have tried abx, prednisone, inhalers and nebs.  If unable to get in soon, schedule a f/u appt with me.

## 2023-06-03 NOTE — Telephone Encounter (Signed)
Pt is going to call pulmonary and let me know if I need to do anything more or schedule her an appt with Dr Lorin Picket.

## 2023-06-03 NOTE — Telephone Encounter (Signed)
Update for you. Do you want her to come back in this week to be re-evaluated.

## 2023-06-05 ENCOUNTER — Encounter: Payer: Self-pay | Admitting: Pulmonary Disease

## 2023-06-05 ENCOUNTER — Ambulatory Visit: Payer: BC Managed Care – PPO | Admitting: Pulmonary Disease

## 2023-06-05 VITALS — BP 120/82 | HR 89 | Temp 97.3°F | Ht 62.0 in | Wt 200.6 lb

## 2023-06-05 DIAGNOSIS — R053 Chronic cough: Secondary | ICD-10-CM | POA: Diagnosis not present

## 2023-06-05 LAB — NITRIC OXIDE: Nitric Oxide: 16

## 2023-06-05 MED ORDER — BUDESONIDE-FORMOTEROL FUMARATE 160-4.5 MCG/ACT IN AERO: 2.0000 | INHALATION_SPRAY | Freq: Two times a day (BID) | RESPIRATORY_TRACT | 12 refills | Status: AC

## 2023-06-05 NOTE — Progress Notes (Signed)
 Synopsis: Referred in by Glendia Shad, MD   Subjective:   PATIENT ID: Donna Hensley GENDER: female DOB: [birth year], MRN: [id number]  Chief Complaint  Patient presents with   Consult    Cough with white/tan/yellow. No wheezing. No SOB.    HPI [name] is a 58 year old female patient with a past medical history of DM type II, mild to moderate persistent asthma, seasonal allergies, hyperlipidemia, hypertension and gerd presenting today to the pulmonary clinic for chronic cough since December.   She was diagnosed with asthma as an adult and has been on albuterol  as needed. She contracted a URI in December and received multiple rounds of antibiotics and prednisone , symptoms improved however her cough persisted.   She denies any chest tightness, wheezing, shortness of breath.   She tells me that every time she contracts a viral infection she goes into an acute bronchitis episode and develops wheezing with chest tightness.   CXR 03/2023 - No active cardiopulmonary disease. Her son has exercise induced asthma.   Family history - Grandparent with asthma.   Social history - Previous smoker, quit in 1990, smoked 1/2 ppd for 4 to 5 years. Lives with her husband. Has 2 dogs at home.   ROS All systems were reviewed and are negative except for the above.  Objective:   Vitals:   06/05/23 1523  BP: 120/82  Pulse: 89  Temp: (!) 97.3 F (36.3 C)  SpO2: 97%  Weight: 200 lb 9.6 oz (91 kg)  Height: 5' 2 (1.575 m)   97% on RA BMI Readings from Last 3 Encounters:  06/05/23 36.69 kg/m  05/16/23 36.58 kg/m  04/27/23 36.65 kg/m   Wt Readings from Last 3 Encounters:  06/05/23 200 lb 9.6 oz (91 kg)  05/16/23 200 lb (90.7 kg)  04/27/23 200 lb 6.4 oz (90.9 kg)    Physical Exam GEN: NAD, Healthy Appearing HEENT: Supple Neck, Reactive Pupils, EOMI  CVS: Normal S1, Normal S2, RRR, No murmurs or ES appreciated  Lungs: Clear bilateral air entry.  Abdomen: Soft, non tender,  non distended, + BS  Extremities: Warm and well perfused, No edema  Skin: No suspicious lesions appreciated  Psych: Normal Affect  Ancillary Information   CBC    Component Value Date/Time   WBC 9.2 04/22/2023 1215   RBC 5.25 (H) 04/22/2023 1215   HGB 14.9 04/22/2023 1215   HCT 45.0 04/22/2023 1215   PLT 280.0 04/22/2023 1215   MCV 85.8 04/22/2023 1215   MCHC 33.2 04/22/2023 1215   RDW 14.7 04/22/2023 1215   LYMPHSABS 1.5 04/22/2023 1215   MONOABS 0.7 04/22/2023 1215   EOSABS 0.2 04/22/2023 1215   BASOSABS 0.0 04/22/2023 1215   Labs and imaging were reviewed.      No data to display           Assessment & Plan:  [name] is a 58 year old female patient with a past medical history of DM type II, mild to moderate persistent asthma, seasonal allergies, hyperlipidemia, hypertension and gerd presenting today to the pulmonary clinic for chronic cough since December.   #Moderate persistent asthma  #Acute bronchitis post URI in late December  Feno 16. I think the diagnosis of asthma was made based on multiple episodes of bronchitis post URI sometimes with wheezing and chest tightness. I will obtain a cbc w/ diff to evaluate her Eos count, Allergen panel and total IgE. I will obtain PFTs to assess pulmonary function and increase her  symbicort  to 160-4.5.   []  PFTs  []  CBC w/diff, allergen panel with total IgE  []  Increase budesonide -formoterol  [Symbicort ] 160-4.5 2puffs bid.  []  c/w Albuterol  as needed.   Return in about 3 months (around 09/02/2023).  I spent 60 minutes caring for this patient today, including preparing to see the patient, obtaining a medical history , reviewing a separately obtained history, performing a medically appropriate examination and/or evaluation, counseling and educating the patient/family/caregiver, ordering medications, tests, or procedures, documenting clinical information in the electronic health record, and independently interpreting results (not  separately reported/billed) and communicating results to the patient/family/caregiver  Darrin Barn, MD [facility] 06/05/2023 9:10 PM

## 2023-06-25 ENCOUNTER — Other Ambulatory Visit: Payer: Self-pay | Admitting: Internal Medicine

## 2023-07-05 LAB — ALLERGEN PANEL (27) + IGE
Alternaria Alternata IgE: <0.1 kU/L
Aspergillus Fumigatus IgE: <0.1 kU/L
Bahia Grass IgE: <0.1 kU/L
Bermuda Grass IgE: <0.1 kU/L
Cat Dander IgE: <0.1 kU/L
Cedar, Mountain IgE: <0.1 kU/L
Cladosporium Herbarum IgE: <0.1 kU/L
Cocklebur IgE: <0.1 kU/L
Cockroach, American IgE: <0.1 kU/L
Common Silver Birch IgE: <0.1 kU/L
D Farinae IgE: <0.1 kU/L
D Pteronyssinus IgE: 0.12 kU/L — AB
Dog Dander IgE: <0.1 kU/L
Elm, American IgE: <0.1 kU/L
Hickory, White IgE: <0.1 kU/L
IgE (Immunoglobulin E), Serum: 50 [IU]/mL (ref 6–495)
Johnson Grass IgE: <0.1 kU/L
Kentucky Bluegrass IgE: <0.1 kU/L
Maple/Box Elder IgE: <0.1 kU/L
Mucor Racemosus IgE: <0.1 kU/L
Oak, White IgE: <0.1 kU/L
Penicillium Chrysogen IgE: <0.1 kU/L
Pigweed, Rough IgE: <0.1 kU/L
Plantain, English IgE: <0.1 kU/L
Ragweed, Short IgE: <0.1 kU/L
Setomelanomma Rostrat: <0.1 kU/L
Timothy Grass IgE: <0.1 kU/L
White Mulberry IgE: <0.1 kU/L

## 2023-07-05 LAB — CBC WITH DIFFERENTIAL/PLATELET
Basophils Absolute: 0 10*3/uL (ref 0.0–0.2)
Basos: 0 %
EOS (ABSOLUTE): 0.2 10*3/uL (ref 0.0–0.4)
Eos: 2 %
Hematocrit: 44.8 % (ref 34.0–46.6)
Hemoglobin: 14.9 g/dL (ref 11.1–15.9)
Immature Grans (Abs): 0 10*3/uL (ref 0.0–0.1)
Immature Granulocytes: 0 %
Lymphocytes Absolute: 2.7 10*3/uL (ref 0.7–3.1)
Lymphs: 27 %
MCH: 28.1 pg (ref 26.6–33.0)
MCHC: 33.3 g/dL (ref 31.5–35.7)
MCV: 85 fL (ref 79–97)
Monocytes Absolute: 0.6 10*3/uL (ref 0.1–0.9)
Monocytes: 6 %
Neutrophils Absolute: 6.3 10*3/uL (ref 1.4–7.0)
Neutrophils: 65 %
Platelets: 316 10*3/uL (ref 150–450)
RBC: 5.3 x10E6/uL — ABNORMAL HIGH (ref 3.77–5.28)
RDW: 14.4 % (ref 11.7–15.4)
WBC: 9.9 10*3/uL (ref 3.4–10.8)

## 2023-07-18 ENCOUNTER — Other Ambulatory Visit: Payer: BC Managed Care – PPO

## 2023-07-19 ENCOUNTER — Other Ambulatory Visit

## 2023-07-19 DIAGNOSIS — E7800 Pure hypercholesterolemia, unspecified: Secondary | ICD-10-CM | POA: Diagnosis not present

## 2023-07-19 DIAGNOSIS — E1165 Type 2 diabetes mellitus with hyperglycemia: Secondary | ICD-10-CM

## 2023-07-19 DIAGNOSIS — E611 Iron deficiency: Secondary | ICD-10-CM

## 2023-07-19 LAB — CBC WITH DIFFERENTIAL/PLATELET
Basophils Absolute: 0 10*3/uL (ref 0.0–0.1)
Basophils Relative: 0.6 % (ref 0.0–3.0)
Eosinophils Absolute: 0.3 10*3/uL (ref 0.0–0.7)
Eosinophils Relative: 3.9 % (ref 0.0–5.0)
HCT: 41.6 % (ref 36.0–46.0)
Hemoglobin: 13.8 g/dL (ref 12.0–15.0)
Lymphocytes Relative: 33.3 % (ref 12.0–46.0)
Lymphs Abs: 2.6 10*3/uL (ref 0.7–4.0)
MCHC: 33.3 g/dL (ref 30.0–36.0)
MCV: 85 fl (ref 78.0–100.0)
Monocytes Absolute: 0.5 10*3/uL (ref 0.1–1.0)
Monocytes Relative: 6.7 % (ref 3.0–12.0)
Neutro Abs: 4.3 10*3/uL (ref 1.4–7.7)
Neutrophils Relative %: 55.5 % (ref 43.0–77.0)
Platelets: 300 10*3/uL (ref 150.0–400.0)
RBC: 4.9 Mil/uL (ref 3.87–5.11)
RDW: 15.7 % — ABNORMAL HIGH (ref 11.5–15.5)
WBC: 7.7 10*3/uL (ref 4.0–10.5)

## 2023-07-19 LAB — HEPATIC FUNCTION PANEL
ALT: 25 U/L (ref 0–35)
AST: 20 U/L (ref 0–37)
Albumin: 4.3 g/dL (ref 3.5–5.2)
Alkaline Phosphatase: 64 U/L (ref 39–117)
Bilirubin, Direct: 0.2 mg/dL (ref 0.0–0.3)
Total Bilirubin: 0.6 mg/dL (ref 0.2–1.2)
Total Protein: 7.1 g/dL (ref 6.0–8.3)

## 2023-07-19 LAB — LIPID PANEL
Cholesterol: 115 mg/dL (ref 0–200)
HDL: 58.6 mg/dL (ref >39.00)
LDL Cholesterol: 35 mg/dL (ref 0–99)
NonHDL: 56.4
Total CHOL/HDL Ratio: 2
Triglycerides: 107 mg/dL (ref 0.0–149.0)
VLDL: 21.4 mg/dL (ref 0.0–40.0)

## 2023-07-19 LAB — TSH: TSH: 1.62 u[IU]/mL (ref 0.35–5.50)

## 2023-07-19 LAB — IBC + FERRITIN
Ferritin: 9.4 ng/mL — ABNORMAL LOW (ref 10.0–291.0)
Iron: 59 ug/dL (ref 42–145)
Saturation Ratios: 13.4 % — ABNORMAL LOW (ref 20.0–50.0)
TIBC: 441 ug/dL (ref 250.0–450.0)
Transferrin: 315 mg/dL (ref 212.0–360.0)

## 2023-07-19 LAB — BASIC METABOLIC PANEL
BUN: 14 mg/dL (ref 6–23)
CO2: 22 meq/L (ref 19–32)
Calcium: 9.9 mg/dL (ref 8.4–10.5)
Chloride: 105 meq/L (ref 96–112)
Creatinine, Ser: 0.94 mg/dL (ref 0.40–1.20)
GFR: 68.42 mL/min (ref >60.00)
Glucose, Bld: 115 mg/dL — ABNORMAL HIGH (ref 70–99)
Potassium: 3.6 meq/L (ref 3.5–5.1)
Sodium: 139 meq/L (ref 135–145)

## 2023-07-19 LAB — HEMOGLOBIN A1C: Hgb A1c MFr Bld: 6.6 % — ABNORMAL HIGH (ref 4.6–6.5)

## 2023-07-22 ENCOUNTER — Ambulatory Visit (INDEPENDENT_AMBULATORY_CARE_PROVIDER_SITE_OTHER): Payer: BC Managed Care – PPO | Admitting: Internal Medicine

## 2023-07-22 ENCOUNTER — Encounter: Payer: Self-pay | Admitting: Internal Medicine

## 2023-07-22 VITALS — BP 128/70 | HR 84 | Temp 98.0°F | Resp 16 | Ht 62.0 in | Wt 196.4 lb

## 2023-07-22 DIAGNOSIS — E1165 Type 2 diabetes mellitus with hyperglycemia: Secondary | ICD-10-CM

## 2023-07-22 DIAGNOSIS — E611 Iron deficiency: Secondary | ICD-10-CM

## 2023-07-22 DIAGNOSIS — Z0000 Encounter for general adult medical examination without abnormal findings: Secondary | ICD-10-CM | POA: Diagnosis not present

## 2023-07-22 DIAGNOSIS — F439 Reaction to severe stress, unspecified: Secondary | ICD-10-CM | POA: Insufficient documentation

## 2023-07-22 DIAGNOSIS — R053 Chronic cough: Secondary | ICD-10-CM

## 2023-07-22 DIAGNOSIS — I10 Essential (primary) hypertension: Secondary | ICD-10-CM

## 2023-07-22 DIAGNOSIS — E7800 Pure hypercholesterolemia, unspecified: Secondary | ICD-10-CM

## 2023-07-22 DIAGNOSIS — J4520 Mild intermittent asthma, uncomplicated: Secondary | ICD-10-CM

## 2023-07-22 DIAGNOSIS — K219 Gastro-esophageal reflux disease without esophagitis: Secondary | ICD-10-CM

## 2023-07-22 MED ORDER — INTEGRA 62.5-62.5-40-3 MG PO CAPS: ORAL_CAPSULE | ORAL | 2 refills | Status: DC

## 2023-07-22 NOTE — Assessment & Plan Note (Signed)
 Saw pulmonary. Symbicort adjusted as outlined. Cough resolved. Breathing stable. Follow.
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
## 2023-07-22 NOTE — Assessment & Plan Note (Signed)
 Low carb diet and exercise.  Recent A1c 6.6. follow. Hold on making any changes.

## 2023-07-22 NOTE — Assessment & Plan Note (Signed)
 Recent hgb wnl. Iron stores decreased. Has seen GI. Discussed integra. Follow.

## 2023-07-22 NOTE — Assessment & Plan Note (Signed)
 No increased acid reflux.  Protonix.

## 2023-07-22 NOTE — Assessment & Plan Note (Signed)
 Continue symbicort.  Singulair.  Breathing stable.

## 2023-07-22 NOTE — Assessment & Plan Note (Addendum)
 Physical today 07/22/23.  PAP 05/11/22 - benign reactive/repair changes, negative HPV. Colonoscopy 04/2018 - recommended f/u in 3 years. Colonoscopy 06/04/22 - two (3-55mm) polyps hepatic flexure and non bleeding internal hemorrhoids. Mammogram 06/2020 - abnormal - recommended biopsy.  The patient underwent radiology directed stereotactic biopsy of a density in the left breast on August 26, 2020. Review of the postbiopsy views shows the area of concern completely removed. Pathology was benign. Dr Lemar Livings recommended return to screening mammogram 06/2021.  Had f/u bilateral diagnostic mammogram 11/2021 - recommended f/u left breast mammogram.  F/u left breast mammogram 01/18/22 - Birads I.  Mammogram 05/16/23 - Birads I.

## 2023-07-22 NOTE — Assessment & Plan Note (Signed)
 Tolerating crestor.  Low cholesterol diet and exercise.  Follow lipid panel and liver function tests.   Lab Results  Component Value Date   CHOL 115 07/19/2023   HDL 58.60 07/19/2023   LDLCALC 35 07/19/2023   TRIG 107.0 07/19/2023   CHOLHDL 2 07/19/2023

## 2023-07-22 NOTE — Assessment & Plan Note (Signed)
 On losartan and amlodipine 5mg  q day. Follow pressures.  No changes today.

## 2023-07-22 NOTE — Assessment & Plan Note (Signed)
 Increased stress as outlined.  Discussed. Planning to see a Veterinary surgeon. Has good support. Will call if problems.

## 2023-08-15 LAB — HM DIABETES EYE EXAM

## 2023-09-18 ENCOUNTER — Ambulatory Visit: Payer: BC Managed Care – PPO | Admitting: Pulmonary Disease

## 2023-10-28 ENCOUNTER — Other Ambulatory Visit: Payer: Self-pay | Admitting: Internal Medicine

## 2023-11-15 ENCOUNTER — Other Ambulatory Visit: Payer: Self-pay | Admitting: Internal Medicine

## 2023-11-19 ENCOUNTER — Other Ambulatory Visit

## 2023-11-21 ENCOUNTER — Ambulatory Visit: Admitting: Internal Medicine

## 2023-12-13 ENCOUNTER — Other Ambulatory Visit: Payer: Self-pay | Admitting: Internal Medicine

## 2024-01-13 ENCOUNTER — Ambulatory Visit: Admitting: Pulmonary Disease

## 2024-01-13 ENCOUNTER — Encounter: Payer: Self-pay | Admitting: Pulmonary Disease

## 2024-01-13 ENCOUNTER — Encounter: Payer: Self-pay | Admitting: Internal Medicine

## 2024-01-13 VITALS — BP 110/68 | HR 80 | Temp 97.1°F | Resp 98 | Ht 62.0 in | Wt 201.0 lb

## 2024-01-13 DIAGNOSIS — R053 Chronic cough: Secondary | ICD-10-CM | POA: Diagnosis not present

## 2024-01-13 DIAGNOSIS — J4540 Moderate persistent asthma, uncomplicated: Secondary | ICD-10-CM | POA: Diagnosis not present

## 2024-01-13 DIAGNOSIS — J45909 Unspecified asthma, uncomplicated: Secondary | ICD-10-CM | POA: Insufficient documentation

## 2024-01-13 DIAGNOSIS — J209 Acute bronchitis, unspecified: Secondary | ICD-10-CM | POA: Diagnosis not present

## 2024-01-13 NOTE — Progress Notes (Signed)
 Synopsis: Referred in by Glendia Shad, MD   Subjective:   PATIENT ID: Donna Hensley GENDER: female DOB: 1965-09-09, MRN: [id number]  Chief Complaint  Patient presents with   Cough    Cough is gone. Using Symbicort  BID.    HPI Donna Hensley is a 58 year old female patient with a past medical history of DM type II, mild to moderate persistent asthma, seasonal allergies, hyperlipidemia, hypertension and gerd presenting today to the pulmonary clinic for chronic cough since December.   She was diagnosed with asthma as an adult and has been on albuterol  as needed. She contracted a URI in December and received multiple rounds of antibiotics and prednisone , symptoms improved however her cough persisted.   She denies any chest tightness, wheezing, shortness of breath.   She tells me that every time she contracts a viral infection she goes into an acute bronchitis episode and develops wheezing with chest tightness.   CXR 03/2023 - No active cardiopulmonary disease. Her son has exercise induced asthma.   Family history - Grandparent with asthma.   Social history - Previous smoker, quit in 1990, smoked 1/2 ppd for 4 to 5 years. Lives with her husband. Has 2 dogs at home.   OV 01/13/2024 - Donna Hensley is feeling well her cough has subsided after starting Symbicort .  She is on Symbicort  160-4.5 2 puffs twice a day.  I have asked her to come down on her inhaler to 1 puff twice a day and I will see her back in 6 months.  Unfortunately she has not done her PFTs I am okay with holding off for now.  ROS All systems were reviewed and are negative except for the above.  Objective:   Vitals:   01/13/24 1605  BP: 110/68  Pulse: 80  Resp: (!) 98  Temp: (!) 97.1 F (36.2 C)  Weight: 201 lb (91.2 kg)  Height: 5' 2 (1.575 m)     on RA BMI Readings from Last 3 Encounters:  01/13/24 36.76 kg/m  07/22/23 35.92 kg/m  06/05/23 36.69 kg/m   Wt Readings from Last 3 Encounters:  01/13/24  201 lb (91.2 kg)  07/22/23 196 lb 6.4 oz (89.1 kg)  06/05/23 200 lb 9.6 oz (91 kg)    Physical Exam GEN: NAD, Healthy Appearing HEENT: Supple Neck, Reactive Pupils, EOMI  CVS: Normal S1, Normal S2, RRR, No murmurs or ES appreciated  Lungs: Clear bilateral air entry.  Abdomen: Soft, non tender, non distended, + BS  Extremities: Warm and well perfused, No edema  Skin: No suspicious lesions appreciated  Psych: Normal Affect  Ancillary Information   CBC    Component Value Date/Time   WBC 7.7 07/19/2023 0914   RBC 4.90 07/19/2023 0914   HGB 13.8 07/19/2023 0914   HGB 14.9 07/01/2023 0927   HCT 41.6 07/19/2023 0914   HCT 44.8 07/01/2023 0927   PLT 300.0 07/19/2023 0914   PLT 316 07/01/2023 0927   MCV 85.0 07/19/2023 0914   MCV 85 07/01/2023 0927   MCH 28.1 07/01/2023 0927   MCHC 33.3 07/19/2023 0914   RDW 15.7 (H) 07/19/2023 0914   RDW 14.4 07/01/2023 0927   LYMPHSABS 2.6 07/19/2023 0914   LYMPHSABS 2.7 07/01/2023 0927   MONOABS 0.5 07/19/2023 0914   EOSABS 0.3 07/19/2023 0914   EOSABS 0.2 07/01/2023 0927   BASOSABS 0.0 07/19/2023 0914   BASOSABS 0.0 07/01/2023 0927   Labs and imaging were reviewed.      No data  to display           Assessment & Plan:  [name] is a 58 year old female patient with a past medical history of DM type II, mild to moderate persistent asthma, seasonal allergies, hyperlipidemia, hypertension and gerd presenting today to the pulmonary clinic for chronic cough since December.   #Moderate persistent asthma - CVA  #Acute bronchitis post URI in late December  Feno 16. I think the diagnosis of asthma was made based on multiple episodes of bronchitis post URI sometimes with wheezing and chest tightness.  Peripheral eos 300 Total IgE 50 Allergen panel underwhelming.  []  Decrease budesonide -formoterol  [Symbicort ] 160-4.5 to 1 puff bid.  []  c/w Albuterol  as needed.   RTC 6 months.   I spent 20 minutes caring for this patient today,  including preparing to see the patient, obtaining a medical history , reviewing a separately obtained history, performing a medically appropriate examination and/or evaluation, counseling and educating the patient/family/caregiver, documenting clinical information in the electronic health record, and independently interpreting results (not separately reported/billed) and communicating results to the patient/family/caregiver  Darrin Barn, MD  Pulmonary Critical Care 01/13/2024 5:34 PM

## 2024-01-16 ENCOUNTER — Other Ambulatory Visit

## 2024-01-16 DIAGNOSIS — E1165 Type 2 diabetes mellitus with hyperglycemia: Secondary | ICD-10-CM | POA: Diagnosis not present

## 2024-01-16 DIAGNOSIS — E7800 Pure hypercholesterolemia, unspecified: Secondary | ICD-10-CM | POA: Diagnosis not present

## 2024-01-16 LAB — HEPATIC FUNCTION PANEL
ALT: 20 U/L (ref 0–35)
AST: 17 U/L (ref 0–37)
Albumin: 4.5 g/dL (ref 3.5–5.2)
Alkaline Phosphatase: 69 U/L (ref 39–117)
Bilirubin, Direct: 0.2 mg/dL (ref 0.0–0.3)
Total Bilirubin: 0.7 mg/dL (ref 0.2–1.2)
Total Protein: 6.9 g/dL (ref 6.0–8.3)

## 2024-01-16 LAB — TSH: TSH: 1.48 u[IU]/mL (ref 0.35–5.50)

## 2024-01-16 LAB — LIPID PANEL
Cholesterol: 136 mg/dL (ref 0–200)
HDL: 68.9 mg/dL (ref >39.00)
LDL Cholesterol: 52 mg/dL (ref 0–99)
NonHDL: 66.77
Total CHOL/HDL Ratio: 2
Triglycerides: 74 mg/dL (ref 0.0–149.0)
VLDL: 14.8 mg/dL (ref 0.0–40.0)

## 2024-01-16 LAB — BASIC METABOLIC PANEL WITH GFR
BUN: 12 mg/dL (ref 6–23)
CO2: 25 meq/L (ref 19–32)
Calcium: 9.8 mg/dL (ref 8.4–10.5)
Chloride: 104 meq/L (ref 96–112)
Creatinine, Ser: 0.87 mg/dL (ref 0.40–1.20)
GFR: 74.82 mL/min (ref >60.00)
Glucose, Bld: 128 mg/dL — ABNORMAL HIGH (ref 70–99)
Potassium: 3.9 meq/L (ref 3.5–5.1)
Sodium: 139 meq/L (ref 135–145)

## 2024-01-16 LAB — HEMOGLOBIN A1C: Hgb A1c MFr Bld: 6.6 % — ABNORMAL HIGH (ref 4.6–6.5)

## 2024-01-17 ENCOUNTER — Ambulatory Visit: Payer: Self-pay | Admitting: Internal Medicine

## 2024-01-21 ENCOUNTER — Ambulatory Visit: Admitting: Internal Medicine

## 2024-01-21 VITALS — BP 122/70 | HR 89 | Resp 16 | Ht 62.0 in | Wt 199.0 lb

## 2024-01-21 DIAGNOSIS — E1165 Type 2 diabetes mellitus with hyperglycemia: Secondary | ICD-10-CM | POA: Diagnosis not present

## 2024-01-21 DIAGNOSIS — F439 Reaction to severe stress, unspecified: Secondary | ICD-10-CM | POA: Diagnosis not present

## 2024-01-21 DIAGNOSIS — E7800 Pure hypercholesterolemia, unspecified: Secondary | ICD-10-CM

## 2024-01-21 DIAGNOSIS — I10 Essential (primary) hypertension: Secondary | ICD-10-CM | POA: Diagnosis not present

## 2024-01-21 DIAGNOSIS — B182 Chronic viral hepatitis C: Secondary | ICD-10-CM

## 2024-01-21 DIAGNOSIS — K219 Gastro-esophageal reflux disease without esophagitis: Secondary | ICD-10-CM

## 2024-01-21 DIAGNOSIS — J4520 Mild intermittent asthma, uncomplicated: Secondary | ICD-10-CM

## 2024-01-21 NOTE — Progress Notes (Signed)
 Subjective:    Patient ID: Donna Hensley, female    DOB: 1965-06-04, 58 y.o.   MRN: [id number]  Patient here for  Chief Complaint  Patient presents with   Medical Management of Chronic Issues    HPI Here for a scheduled follow up - follow up regarding diabetes, GERD, hypertension and hypercholesterolemia. Saw Dr Malka 06/05/23 - chronic cough. Diagnosed - moderate persistent asthma.. recommended PFTs and increase symbicort  - 160-4.5. had f/u 01/13/24 - decreased symbicort  to 1 puff bid. Breathing doing well. No increased cough. No acid reflux reported. No abdominal pain or bowel change reported. Increased stress. Appears to be handling things well.    Past Medical History:  Diagnosis Date   Allergy    Asthma    GERD (gastroesophageal reflux disease)    Gestational diabetes    Hypertension    Past Surgical History:  Procedure Laterality Date   APPENDECTOMY  1986   BREAST BIOPSY Left 08/26/2020   Affirm bx-RIBBON clip path pending   CESAREAN SECTION  1996 & 2000   CHOLECYSTECTOMY  2013   COLONOSCOPY WITH PROPOFOL  N/A 05/05/2018   Procedure: COLONOSCOPY WITH PROPOFOL ;  Surgeon: Viktoria Lamar DASEN, MD;  Location: [facility] ENDOSCOPY;  Service: Endoscopy;  Laterality: N/A;   ESOPHAGOGASTRODUODENOSCOPY (EGD) WITH PROPOFOL  N/A 05/05/2018   Procedure: ESOPHAGOGASTRODUODENOSCOPY (EGD) WITH PROPOFOL ;  Surgeon: Viktoria Lamar DASEN, MD;  Location: [facility] ENDOSCOPY;  Service: Endoscopy;  Laterality: N/A;   TONSILLECTOMY  1987   wisdom tooth removal     Family History  Problem Relation Age of Onset   Hyperlipidemia Mother    Osteoporosis Mother    Hypertension Father    Arthritis Father    Sleep apnea Father    Heart disease Father    Sleep apnea Brother    Dementia Maternal Grandmother    Arthritis Paternal Grandmother    Diabetes Paternal Grandmother    Migraines Cousin    Breast cancer Neg Hx    Social History   Socioeconomic History   Marital status: Married    Spouse name:  Not on file   Number of children: 2   Years of education: Not on file   Highest education level: Associate degree: occupational, Scientist, product/process development, or vocational program  Occupational History   Occupation: Therapist, occupational: [employer]   Tobacco Use   Smoking status: Former    Current packs/day: 0.00    Average packs/day: 0.3 packs/day for 5.0 years (1.3 ttl pk-yrs)    Types: Cigarettes    Start date: 09/29/1983    Quit date: 09/28/1988    Years since quitting: 35.3   Smokeless tobacco: Never   Tobacco comments:    smoked briefly as a teenager  Vaping Use   Vaping status: Never Used  Substance and Sexual Activity   Alcohol use: Yes    Alcohol/week: 1.0 standard drink of alcohol    Types: 1 Glasses of wine per week    Comment: Occasional 1-2 per month   Drug use: Never   Sexual activity: Not on file  Other Topics Concern   Not on file  Social History Narrative   Regular exercise-yes, walk   Diet: fast food, trying to improve diet         Social Drivers of Health   Financial Resource Strain: Low Risk  (01/20/2024)   Overall Financial Resource Strain (CARDIA)    Difficulty of Paying Living Expenses: Not very hard  Food Insecurity: No Food Insecurity (01/20/2024)   Hunger  Vital Sign    Worried About Programme researcher, broadcasting/film/video in the Last Year: Never true    Ran Out of Food in the Last Year: Never true  Transportation Needs: No Transportation Needs (01/20/2024)   PRAPARE - Administrator, Civil Service (Medical): No    Lack of Transportation (Non-Medical): No  Physical Activity: Insufficiently Active (01/20/2024)   Exercise Vital Sign    Days of Exercise per Week: 3 days    Minutes of Exercise per Session: 20 min  Stress: Stress Concern Present (01/20/2024)   [employer] of Occupational Health - Occupational Stress Questionnaire    Feeling of Stress: To some extent  Social Connections: Moderately Isolated (01/20/2024)   Social Connection and Isolation Panel     Frequency of Communication with Friends and Family: More than three times a week    Frequency of Social Gatherings with Friends and Family: More than three times a week    Attends Religious Services: Never    Database administrator or Organizations: No    Attends Engineer, structural: Not on file    Marital Status: Married     Review of Systems  Constitutional:  Negative for appetite change and unexpected weight change.  HENT:  Negative for congestion and sinus pressure.   Respiratory:  Negative for cough, chest tightness and shortness of breath.   Cardiovascular:  Negative for chest pain, palpitations and leg swelling.  Gastrointestinal:  Negative for abdominal pain, diarrhea, nausea and vomiting.  Genitourinary:  Negative for difficulty urinating and dysuria.  Musculoskeletal:  Negative for joint swelling and myalgias.  Skin:  Negative for color change and rash.  Neurological:  Negative for dizziness and headaches.  Psychiatric/Behavioral:  Negative for agitation and dysphoric mood.        Objective:     BP 122/70   Pulse 89   Resp 16   Ht 5' 2 (1.575 m)   Wt 199 lb (90.3 kg)   LMP 05/05/2017   SpO2 99%   BMI 36.40 kg/m  Wt Readings from Last 3 Encounters:  01/21/24 199 lb (90.3 kg)  01/13/24 201 lb (91.2 kg)  07/22/23 196 lb 6.4 oz (89.1 kg)    Physical Exam Vitals reviewed.  Constitutional:      General: She is not in acute distress.    Appearance: Normal appearance.  HENT:     Head: Normocephalic and atraumatic.     Right Ear: External ear normal.     Left Ear: External ear normal.     Mouth/Throat:     Pharynx: No oropharyngeal exudate or posterior oropharyngeal erythema.  Eyes:     General: No scleral icterus.       Right eye: No discharge.        Left eye: No discharge.     Conjunctiva/sclera: Conjunctivae normal.  Neck:     Thyroid : No thyromegaly.  Cardiovascular:     Rate and Rhythm: Normal rate and regular rhythm.  Pulmonary:      Effort: No respiratory distress.     Breath sounds: Normal breath sounds. No wheezing.  Abdominal:     General: Bowel sounds are normal.     Palpations: Abdomen is soft.     Tenderness: There is no abdominal tenderness.  Musculoskeletal:        General: No swelling or tenderness.     Cervical back: Neck supple. No tenderness.  Lymphadenopathy:     Cervical: No cervical adenopathy.  Skin:  Findings: No erythema or rash.  Neurological:     Mental Status: She is alert.  Psychiatric:        Mood and Affect: Mood normal.        Behavior: Behavior normal.         Outpatient Encounter Medications as of 01/21/2024  Medication Sig   albuterol  (PROVENTIL ) (2.5 MG/3ML) 0.083% nebulizer solution Take 3 mLs (2.5 mg total) by nebulization every 6 (six) hours as needed for wheezing or shortness of breath.   albuterol  (VENTOLIN  HFA) 108 (90 Base) MCG/ACT inhaler Inhale 2 puffs into the lungs every 6 (six) hours as needed for wheezing.   amLODipine  (NORVASC ) 5 MG tablet Take 1 tablet (5 mg total) by mouth daily.   budesonide -formoterol  (SYMBICORT ) 160-4.5 MCG/ACT inhaler Inhale 2 puffs into the lungs in the morning and at bedtime.   famotidine  (PEPCID ) 20 MG tablet One after supper   Fe Fum-FePoly-Vit C-Vit B3 (INTEGRA) 62.5-62.5-40-3 MG CAPS Take one capsule 2-3 days per week.   fluticasone  (FLONASE ) 50 MCG/ACT nasal spray Place 2 sprays into both nostrils daily.   glucose blood test strip Use as instructed   ipratropium (ATROVENT ) 0.06 % nasal spray Place 2 sprays into both nostrils 4 (four) times daily.   levocetirizine (XYZAL) 5 MG tablet Take 5 mg by mouth every evening.   losartan  (COZAAR ) 100 MG tablet TAKE 1 TABLET DAILY   metFORMIN  (GLUCOPHAGE ) 1000 MG tablet TAKE 1 TABLET TWICE A DAY WITH MEALS   montelukast  (SINGULAIR ) 10 MG tablet TAKE 1 TABLET AT BEDTIME   pantoprazole  (PROTONIX ) 40 MG tablet TAKE 1 TABLET DAILY   rosuvastatin  (CRESTOR ) 5 MG tablet TAKE 1 TABLET DAILY    triamcinolone  cream (KENALOG ) 0.1 % Apply 1 Application topically 2 (two) times daily.   No facility-administered encounter medications on file as of 01/21/2024.     Lab Results  Component Value Date   WBC 7.7 07/19/2023   HGB 13.8 07/19/2023   HCT 41.6 07/19/2023   PLT 300.0 07/19/2023   GLUCOSE 128 (H) 01/16/2024   CHOL 136 01/16/2024   TRIG 74.0 01/16/2024   HDL 68.90 01/16/2024   LDLCALC 52 01/16/2024   ALT 20 01/16/2024   AST 17 01/16/2024   NA 139 01/16/2024   K 3.9 01/16/2024   CL 104 01/16/2024   CREATININE 0.87 01/16/2024   BUN 12 01/16/2024   CO2 25 01/16/2024   TSH 1.48 01/16/2024   INR 0.9 01/12/2021   HGBA1C 6.6 (H) 01/16/2024    MM 3D SCREENING MAMMOGRAM BILATERAL BREAST Result Date: 05/16/2023 CLINICAL DATA:  Screening. EXAM: DIGITAL SCREENING BILATERAL MAMMOGRAM WITH TOMOSYNTHESIS AND CAD TECHNIQUE: Bilateral screening digital craniocaudal and mediolateral oblique mammograms were obtained. Bilateral screening digital breast tomosynthesis was performed. The images were evaluated with computer-aided detection. COMPARISON:  Previous exam(s). ACR Breast Density Category b: There are scattered areas of fibroglandular density. FINDINGS: There are no findings suspicious for malignancy. IMPRESSION: No mammographic evidence of malignancy. A result letter of this screening mammogram will be mailed directly to the patient. RECOMMENDATION: Screening mammogram in one year. (Code:SM-B-01Y) BI-RADS CATEGORY  1: Negative. Electronically Signed   By: Corean Salter M.D.   On: 05/16/2023 08:50       Assessment & Plan:  Stress Assessment & Plan: Increased stress. Overall appears to be handling things relatively well. Follow. Notify me if feels needs further intervention.    Hypercholesterolemia Assessment & Plan: Tolerating crestor .  Low cholesterol diet and exercise.  Follow lipid panel and liver  function tests.   Lab Results  Component Value Date   CHOL 136 01/16/2024    HDL 68.90 01/16/2024   LDLCALC 52 01/16/2024   TRIG 74.0 01/16/2024   CHOLHDL 2 01/16/2024     Orders: -     Lipid panel; Future -     Hepatic function panel; Future -     Basic metabolic panel with GFR; Future  Type 2 diabetes mellitus with hyperglycemia, without long-term current use of insulin (HCC) Assessment & Plan: Continue low carb diet and exercise. Follow met b and A1c.  Lab Results  Component Value Date   HGBA1C 6.6 (H) 01/16/2024     Orders: -     Hemoglobin A1c; Future -     Microalbumin / creatinine urine ratio; Future  Mild intermittent asthma in adult without complication Assessment & Plan: Continue symbicort . Recently decreased to 1 puff bid. Breathing doing well. No increased cough.    Gastroesophageal reflux disease without esophagitis Assessment & Plan: No upper GI symptoms reported. Continue protonix .    Essential hypertension, benign Assessment & Plan: Continues on losartan . Blood pressure as outlined. No changes in medication today. Follow pressures.    Chronic hepatitis C without hepatic coma (HCC) Assessment & Plan: Completed 12 weeks of Epclusa .        Allena Hamilton, MD

## 2024-01-26 ENCOUNTER — Encounter: Payer: Self-pay | Admitting: Internal Medicine

## 2024-01-26 NOTE — Assessment & Plan Note (Signed)
 No upper GI symptoms reported. Continue protonix .

## 2024-01-26 NOTE — Assessment & Plan Note (Signed)
 Completed 12 weeks of Epclusa.

## 2024-01-26 NOTE — Assessment & Plan Note (Signed)
 Continue low carb diet and exercise. Follow met b and A1c.  Lab Results  Component Value Date   HGBA1C 6.6 (H) 01/16/2024

## 2024-01-26 NOTE — Assessment & Plan Note (Signed)
 Tolerating crestor .  Low cholesterol diet and exercise.  Follow lipid panel and liver function tests.   Lab Results  Component Value Date   CHOL 136 01/16/2024   HDL 68.90 01/16/2024   LDLCALC 52 01/16/2024   TRIG 74.0 01/16/2024   CHOLHDL 2 01/16/2024

## 2024-01-26 NOTE — Assessment & Plan Note (Signed)
 Continues on losartan . Blood pressure as outlined. No changes in medication today. Follow pressures.

## 2024-01-26 NOTE — Assessment & Plan Note (Signed)
 Continue symbicort . Recently decreased to 1 puff bid. Breathing doing well. No increased cough.

## 2024-01-26 NOTE — Assessment & Plan Note (Signed)
 Increased stress. Overall appears to be handling things relatively well. Follow. Notify me if feels needs further intervention.

## 2024-04-27 ENCOUNTER — Other Ambulatory Visit: Payer: Self-pay | Admitting: Internal Medicine

## 2024-05-21 ENCOUNTER — Ambulatory Visit: Payer: Self-pay | Admitting: Internal Medicine

## 2024-05-21 ENCOUNTER — Other Ambulatory Visit

## 2024-05-21 DIAGNOSIS — E7800 Pure hypercholesterolemia, unspecified: Secondary | ICD-10-CM

## 2024-05-21 DIAGNOSIS — E1165 Type 2 diabetes mellitus with hyperglycemia: Secondary | ICD-10-CM | POA: Diagnosis not present

## 2024-05-21 LAB — LIPID PANEL
Cholesterol: 121 mg/dL (ref 28–200)
HDL: 61.9 mg/dL
LDL Cholesterol: 41 mg/dL (ref 10–99)
NonHDL: 59.59
Total CHOL/HDL Ratio: 2
Triglycerides: 91 mg/dL (ref 10.0–149.0)
VLDL: 18.2 mg/dL (ref 0.0–40.0)

## 2024-05-21 LAB — BASIC METABOLIC PANEL WITH GFR
BUN: 13 mg/dL (ref 6–23)
CO2: 26 meq/L (ref 19–32)
Calcium: 9.6 mg/dL (ref 8.4–10.5)
Chloride: 105 meq/L (ref 96–112)
Creatinine, Ser: 0.9 mg/dL (ref 0.40–1.20)
GFR: 71.66 mL/min
Glucose, Bld: 113 mg/dL — ABNORMAL HIGH (ref 70–99)
Potassium: 3.9 meq/L (ref 3.5–5.1)
Sodium: 140 meq/L (ref 135–145)

## 2024-05-21 LAB — MICROALBUMIN / CREATININE URINE RATIO
Creatinine,U: 193.5 mg/dL
Microalb Creat Ratio: 17.7 mg/g (ref 0.0–30.0)
Microalb, Ur: 3.4 mg/dL — ABNORMAL HIGH (ref 0.7–1.9)

## 2024-05-21 LAB — HEMOGLOBIN A1C: Hgb A1c MFr Bld: 6.2 % (ref 4.6–6.5)

## 2024-05-21 LAB — HEPATIC FUNCTION PANEL
ALT: 24 U/L (ref 3–35)
AST: 19 U/L (ref 5–37)
Albumin: 4.3 g/dL (ref 3.5–5.2)
Alkaline Phosphatase: 68 U/L (ref 39–117)
Bilirubin, Direct: 0.2 mg/dL (ref 0.1–0.3)
Total Bilirubin: 0.7 mg/dL (ref 0.2–1.2)
Total Protein: 6.9 g/dL (ref 6.0–8.3)

## 2024-05-26 ENCOUNTER — Encounter: Payer: Self-pay | Admitting: Internal Medicine

## 2024-05-26 ENCOUNTER — Ambulatory Visit: Admitting: Internal Medicine

## 2024-05-26 ENCOUNTER — Other Ambulatory Visit: Payer: Self-pay

## 2024-05-26 ENCOUNTER — Telehealth: Admitting: Internal Medicine

## 2024-05-26 VITALS — Ht 62.0 in | Wt 195.0 lb

## 2024-05-26 DIAGNOSIS — B182 Chronic viral hepatitis C: Secondary | ICD-10-CM

## 2024-05-26 DIAGNOSIS — Z1283 Encounter for screening for malignant neoplasm of skin: Secondary | ICD-10-CM

## 2024-05-26 DIAGNOSIS — F439 Reaction to severe stress, unspecified: Secondary | ICD-10-CM

## 2024-05-26 DIAGNOSIS — Z1231 Encounter for screening mammogram for malignant neoplasm of breast: Secondary | ICD-10-CM

## 2024-05-26 DIAGNOSIS — J4520 Mild intermittent asthma, uncomplicated: Secondary | ICD-10-CM

## 2024-05-26 DIAGNOSIS — E1165 Type 2 diabetes mellitus with hyperglycemia: Secondary | ICD-10-CM

## 2024-05-26 DIAGNOSIS — I10 Essential (primary) hypertension: Secondary | ICD-10-CM | POA: Diagnosis not present

## 2024-05-26 DIAGNOSIS — K219 Gastro-esophageal reflux disease without esophagitis: Secondary | ICD-10-CM

## 2024-05-26 DIAGNOSIS — E7800 Pure hypercholesterolemia, unspecified: Secondary | ICD-10-CM | POA: Diagnosis not present

## 2024-05-26 MED ORDER — METFORMIN HCL 1000 MG PO TABS: 1000.0000 mg | ORAL_TABLET | Freq: Two times a day (BID) | ORAL | 3 refills | Status: AC

## 2024-05-26 MED ORDER — AMLODIPINE BESYLATE 5 MG PO TABS: 5.0000 mg | ORAL_TABLET | Freq: Every day | ORAL | 1 refills | Status: AC

## 2024-05-26 MED ORDER — PANTOPRAZOLE SODIUM 40 MG PO TBEC: 40.0000 mg | DELAYED_RELEASE_TABLET | Freq: Every day | ORAL | 3 refills | Status: AC

## 2024-05-26 MED ORDER — MONTELUKAST SODIUM 10 MG PO TABS: 10.0000 mg | ORAL_TABLET | Freq: Every day | ORAL | 3 refills | Status: AC

## 2024-05-26 MED ORDER — LOSARTAN POTASSIUM 100 MG PO TABS: 100.0000 mg | ORAL_TABLET | Freq: Every day | ORAL | 3 refills | Status: AC

## 2024-05-26 MED ORDER — ROSUVASTATIN CALCIUM 5 MG PO TABS: 5.0000 mg | ORAL_TABLET | Freq: Every day | ORAL | 3 refills | Status: AC

## 2024-05-26 NOTE — Assessment & Plan Note (Signed)
 She has adjusted her diet. Continue low carb diet and exercise. Follow met b and A1c. A1c improved. Discussed recent labs.  Lab Results  Component Value Date   HGBA1C 6.2 05/21/2024

## 2024-05-26 NOTE — Assessment & Plan Note (Signed)
 Completed 12 weeks of Epclusa.

## 2024-05-26 NOTE — Assessment & Plan Note (Signed)
 Increased stress. Discussed. Overall handling things well. Has good support. Follow.

## 2024-05-26 NOTE — Assessment & Plan Note (Signed)
 Tolerating crestor .  Low cholesterol diet and exercise.  Follow lipid panel.  Lab Results  Component Value Date   CHOL 121 05/21/2024   HDL 61.90 05/21/2024   LDLCALC 41 05/21/2024   TRIG 91.0 05/21/2024   CHOLHDL 2 05/21/2024

## 2024-05-26 NOTE — Progress Notes (Deleted)
 "  Subjective:    Patient ID: Donna Hensley, female    DOB: August 10, 1965, 59 y.o.   MRN: [id number]  Patient here for No chief complaint on file.   HPI Here for a scheduled follow up -  follow up regarding diabetes, GERD, hypertension and hypercholesterolemia. Saw Dr Malka 06/05/23 - chronic cough. Diagnosed - moderate persistent asthma.. recommended PFTs and increase symbicort  - 160-4.5. had f/u 01/13/24 - decreased symbicort  to 1 puff bid. Continues on protonix .    Past Medical History:  Diagnosis Date   Allergy    Asthma    GERD (gastroesophageal reflux disease)    Gestational diabetes    Hypertension    Past Surgical History:  Procedure Laterality Date   APPENDECTOMY  1986   BREAST BIOPSY Left 08/26/2020   Affirm bx-RIBBON clip path pending   CESAREAN SECTION  1996 & 2000   CHOLECYSTECTOMY  2013   COLONOSCOPY WITH PROPOFOL  N/A 05/05/2018   Procedure: COLONOSCOPY WITH PROPOFOL ;  Surgeon: Viktoria Lamar DASEN, MD;  Location: [facility] ENDOSCOPY;  Service: Endoscopy;  Laterality: N/A;   ESOPHAGOGASTRODUODENOSCOPY (EGD) WITH PROPOFOL  N/A 05/05/2018   Procedure: ESOPHAGOGASTRODUODENOSCOPY (EGD) WITH PROPOFOL ;  Surgeon: Viktoria Lamar DASEN, MD;  Location: [facility] ENDOSCOPY;  Service: Endoscopy;  Laterality: N/A;   TONSILLECTOMY  1987   wisdom tooth removal     Family History  Problem Relation Age of Onset   Hyperlipidemia Mother    Osteoporosis Mother    Hypertension Father    Arthritis Father    Sleep apnea Father    Heart disease Father    Sleep apnea Brother    Dementia Maternal Grandmother    Arthritis Paternal Grandmother    Diabetes Paternal Grandmother    Migraines Cousin    Breast cancer Neg Hx    Social History   Socioeconomic History   Marital status: Married    Spouse name: Not on file   Number of children: 2   Years of education: Not on file   Highest education level: Associate degree: occupational, scientist, product/process development, or vocational program  Occupational History    Occupation: Therapist, Occupational: [employer]   Tobacco Use   Smoking status: Former    Current packs/day: 0.00    Average packs/day: 0.3 packs/day for 5.0 years (1.3 ttl pk-yrs)    Types: Cigarettes    Start date: 09/29/1983    Quit date: 09/28/1988    Years since quitting: 35.6   Smokeless tobacco: Never   Tobacco comments:    smoked briefly as a teenager  Vaping Use   Vaping status: Never Used  Substance and Sexual Activity   Alcohol use: Yes    Alcohol/week: 1.0 standard drink of alcohol    Types: 1 Glasses of wine per week    Comment: Occasional 1-2 per month   Drug use: Never   Sexual activity: Not on file  Other Topics Concern   Not on file  Social History Narrative   Regular exercise-yes, walk   Diet: fast food, trying to improve diet         Social Drivers of Health   Tobacco Use: Medium Risk (01/26/2024)   Patient History    Smoking Tobacco Use: Former    Smokeless Tobacco Use: Never    Passive Exposure: Not on file  Financial Resource Strain: Low Risk (01/20/2024)   Overall Financial Resource Strain (CARDIA)    Difficulty of Paying Living Expenses: Not very hard  Food Insecurity: No Food Insecurity (01/20/2024)  Epic    Worried About Programme Researcher, Broadcasting/film/video in the Last Year: Never true    [employer] of Food in the Last Year: Never true  Transportation Needs: No Transportation Needs (01/20/2024)   Epic    Lack of Transportation (Medical): No    Lack of Transportation (Non-Medical): No  Physical Activity: Insufficiently Active (01/20/2024)   Exercise Vital Sign    Days of Exercise per Week: 3 days    Minutes of Exercise per Session: 20 min  Stress: Stress Concern Present (01/20/2024)   [employer] of Occupational Health - Occupational Stress Questionnaire    Feeling of Stress: To some extent  Social Connections: Moderately Isolated (01/20/2024)   Social Connection and Isolation Panel    Frequency of Communication with Friends and Family: More than three  times a week    Frequency of Social Gatherings with Friends and Family: More than three times a week    Attends Religious Services: Never    Database Administrator or Organizations: No    Attends Engineer, Structural: Not on file    Marital Status: Married  Depression (PHQ2-9): Low Risk (04/22/2023)   Depression (PHQ2-9)    PHQ-2 Score: 2  Alcohol Screen: Low Risk (01/20/2024)   Alcohol Screen    Last Alcohol Screening Score (AUDIT): 2  Housing: Low Risk (01/20/2024)   Epic    Unable to Pay for Housing in the Last Year: No    Number of Times Moved in the Last Year: 0    Homeless in the Last Year: No  Utilities: Not on file  Health Literacy: Not on file     Review of Systems     Objective:     LMP 05/05/2017  Wt Readings from Last 3 Encounters:  01/21/24 199 lb (90.3 kg)  01/13/24 201 lb (91.2 kg)  07/22/23 196 lb 6.4 oz (89.1 kg)    Physical Exam  {Perform Simple Foot Exam  Perform Detailed exam:1} {Insert foot Exam (Optional):30965}   Outpatient Encounter Medications as of 05/26/2024  Medication Sig   albuterol  (PROVENTIL ) (2.5 MG/3ML) 0.083% nebulizer solution Take 3 mLs (2.5 mg total) by nebulization every 6 (six) hours as needed for wheezing or shortness of breath.   albuterol  (VENTOLIN  HFA) 108 (90 Base) MCG/ACT inhaler Inhale 2 puffs into the lungs every 6 (six) hours as needed for wheezing.   amLODipine  (NORVASC ) 5 MG tablet TAKE 1 TABLET DAILY   budesonide -formoterol  (SYMBICORT ) 160-4.5 MCG/ACT inhaler Inhale 2 puffs into the lungs in the morning and at bedtime.   famotidine  (PEPCID ) 20 MG tablet One after supper   Fe Fum-FePoly-Vit C-Vit B3 (INTEGRA) 62.5-62.5-40-3 MG CAPS Take one capsule 2-3 days per week.   fluticasone  (FLONASE ) 50 MCG/ACT nasal spray Place 2 sprays into both nostrils daily.   glucose blood test strip Use as instructed   ipratropium (ATROVENT ) 0.06 % nasal spray Place 2 sprays into both nostrils 4 (four) times daily.    levocetirizine (XYZAL) 5 MG tablet Take 5 mg by mouth every evening.   losartan  (COZAAR ) 100 MG tablet TAKE 1 TABLET DAILY   metFORMIN  (GLUCOPHAGE ) 1000 MG tablet TAKE 1 TABLET TWICE A DAY WITH MEALS   montelukast  (SINGULAIR ) 10 MG tablet TAKE 1 TABLET AT BEDTIME   pantoprazole  (PROTONIX ) 40 MG tablet TAKE 1 TABLET DAILY   rosuvastatin  (CRESTOR ) 5 MG tablet TAKE 1 TABLET DAILY   triamcinolone  cream (KENALOG ) 0.1 % Apply 1 Application topically 2 (two) times daily.   No  facility-administered encounter medications on file as of 05/26/2024.     Lab Results  Component Value Date   WBC 7.7 07/19/2023   HGB 13.8 07/19/2023   HCT 41.6 07/19/2023   PLT 300.0 07/19/2023   GLUCOSE 113 (H) 05/21/2024   CHOL 121 05/21/2024   TRIG 91.0 05/21/2024   HDL 61.90 05/21/2024   LDLCALC 41 05/21/2024   ALT 24 05/21/2024   AST 19 05/21/2024   NA 140 05/21/2024   K 3.9 05/21/2024   CL 105 05/21/2024   CREATININE 0.90 05/21/2024   BUN 13 05/21/2024   CO2 26 05/21/2024   TSH 1.48 01/16/2024   INR 0.9 01/12/2021   HGBA1C 6.2 05/21/2024   MICROALBUR 3.4 (H) 05/21/2024    MM 3D SCREENING MAMMOGRAM BILATERAL BREAST Result Date: 05/16/2023 CLINICAL DATA:  Screening. EXAM: DIGITAL SCREENING BILATERAL MAMMOGRAM WITH TOMOSYNTHESIS AND CAD TECHNIQUE: Bilateral screening digital craniocaudal and mediolateral oblique mammograms were obtained. Bilateral screening digital breast tomosynthesis was performed. The images were evaluated with computer-aided detection. COMPARISON:  Previous exam(s). ACR Breast Density Category b: There are scattered areas of fibroglandular density. FINDINGS: There are no findings suspicious for malignancy. IMPRESSION: No mammographic evidence of malignancy. A result letter of this screening mammogram will be mailed directly to the patient. RECOMMENDATION: Screening mammogram in one year. (Code:SM-B-01Y) BI-RADS CATEGORY  1: Negative. Electronically Signed   By: Corean Salter M.D.   On:  05/16/2023 08:50       Assessment & Plan:  Hypercholesterolemia  Type 2 diabetes mellitus with hyperglycemia, without long-term current use of insulin (HCC)  Visit for screening mammogram     Allena Hamilton, MD "

## 2024-05-26 NOTE — Progress Notes (Signed)
 Orders signed.

## 2024-05-26 NOTE — Assessment & Plan Note (Signed)
No upper symptoms reported.  Protonix.  

## 2024-05-26 NOTE — Progress Notes (Signed)
 Future orders pended. Mammo pended

## 2024-05-26 NOTE — Progress Notes (Unsigned)
 Patient ID: Donna Hensley, female   DOB: 08/14/1965, 59 y.o.   MRN: [id number] Changed to virtual visit.

## 2024-05-26 NOTE — Assessment & Plan Note (Signed)
 Continue low carb diet and exercise. Follow met b and A1c.  Lab Results  Component Value Date   HGBA1C 6.2 05/21/2024

## 2024-05-26 NOTE — Assessment & Plan Note (Signed)
 On losartan  and amlodipine  5mg  q day.  Reports blood pressures have been doing ok. Continue to follow pressures. Follow metabolic panel.

## 2024-05-26 NOTE — Assessment & Plan Note (Signed)
 Off symbicort . Does not feel needs. Breathing doing well. Follow.

## 2024-05-26 NOTE — Progress Notes (Signed)
 Patient ID: Donna Hensley, female   DOB: 1966-01-08, 59 y.o.   MRN: [id number]   Virtual Visit via video Note  I connected with Chrissy Hobdy by a video enabled telemedicine application and verified that I am speaking with the correct person using two identifiers. Location patient: home Location provider: work  Persons participating in the virtual visit: patient, provider  The limitations, risks, security and privacy concerns of performing an evaluation and management service by video and the availability of in person appointments have been discussed. It has also been discussed with the patient that there may be a patient responsible charge related to this service. The patient expressed understanding and agreed to proceed.   Reason for visit: follow up appt  HPI: Scheduled follow up -  follow up regarding diabetes, GERD, hypertension and hypercholesterolemia. Saw Dr Malka 06/05/23 - chronic cough. Diagnosed - moderate persistent asthma.. recommended PFTs and increase symbicort  - 160-4.5. had f/u 01/13/24 - decreased symbicort  to 1 puff bid. Breathing is doing well. Not needing symbicort . Not using. No increased cough or congestion. Using a humidifier. No acid reflux reported. Continues on protonix . No abdominal pain or bowel change reported. Increased stress. Aunt just recently passed. Her husband is scheduled to have another surgery in 05/2024. Overall she feels she is handling things relatively well. Request dermatology referral - skin check.    ROS: See pertinent positives and negatives per HPI.  Past Medical History:  Diagnosis Date   Allergy    Asthma    GERD (gastroesophageal reflux disease)    Gestational diabetes    Hypertension     Past Surgical History:  Procedure Laterality Date   APPENDECTOMY  1986   BREAST BIOPSY Left 08/26/2020   Affirm bx-RIBBON clip path pending   CESAREAN SECTION  1996 & 2000   CHOLECYSTECTOMY  2013   COLONOSCOPY WITH PROPOFOL  N/A 05/05/2018    Procedure: COLONOSCOPY WITH PROPOFOL ;  Surgeon: Viktoria Lamar DASEN, MD;  Location: [facility] ENDOSCOPY;  Service: Endoscopy;  Laterality: N/A;   ESOPHAGOGASTRODUODENOSCOPY (EGD) WITH PROPOFOL  N/A 05/05/2018   Procedure: ESOPHAGOGASTRODUODENOSCOPY (EGD) WITH PROPOFOL ;  Surgeon: Viktoria Lamar DASEN, MD;  Location: [facility] ENDOSCOPY;  Service: Endoscopy;  Laterality: N/A;   TONSILLECTOMY  1987   wisdom tooth removal      Family History  Problem Relation Age of Onset   Hyperlipidemia Mother    Osteoporosis Mother    Hypertension Father    Arthritis Father    Sleep apnea Father    Heart disease Father    Sleep apnea Brother    Dementia Maternal Grandmother    Arthritis Paternal Grandmother    Diabetes Paternal Grandmother    Migraines Cousin    Breast cancer Neg Hx     SOCIAL HX: reviewed.   Current Medications[1]  EXAM:  GENERAL: alert, oriented, appears well and in no acute distress  HEENT: atraumatic, conjunttiva clear, no obvious abnormalities on inspection of external nose and ears  NECK: normal movements of the head and neck  LUNGS: on inspection no signs of respiratory distress, breathing rate appears normal, no obvious gross SOB, gasping or wheezing  CV: no obvious cyanosis  PSYCH/NEURO: pleasant and cooperative, no obvious depression or anxiety, speech and thought processing grossly intact  ASSESSMENT AND PLAN:  Discussed the following assessment and plan:  Problem List Items Addressed This Visit     Chronic hepatitis C without hepatic coma (HCC)   Completed 12 weeks of Epclusa .        Diabetes (HCC) -  Primary   She has adjusted her diet. Continue low carb diet and exercise. Follow met b and A1c. A1c improved. Discussed recent labs.  Lab Results  Component Value Date   HGBA1C 6.2 05/21/2024         Essential hypertension   On losartan  and amlodipine  5mg  q day.  Reports blood pressures have been doing ok. Continue to follow pressures. Follow metabolic panel.        GERD   No upper symptoms reported. Protonix .       Hypercholesterolemia   Tolerating crestor .  Low cholesterol diet and exercise.  Follow lipid panel.  Lab Results  Component Value Date   CHOL 121 05/21/2024   HDL 61.90 05/21/2024   LDLCALC 41 05/21/2024   TRIG 91.0 05/21/2024   CHOLHDL 2 05/21/2024         Mild intermittent asthma in adult without complication   Off symbicort . Does not feel needs. Breathing doing well. Follow.       Stress   Increased stress. Discussed. Overall handling things well. Has good support. Follow.       Other Visit Diagnoses       Skin exam, screening for cancer       Relevant Orders   Ambulatory referral to Dermatology       Return in about 3 months (around 08/24/2024) for physical.   I discussed the assessment and treatment plan with the patient. The patient was provided an opportunity to ask questions and all were answered. The patient agreed with the plan and demonstrated an understanding of the instructions.   The patient was advised to call back or seek an in-person evaluation if the symptoms worsen or if the condition fails to improve as anticipated.    Allena Hamilton, MD       [1]  Current Outpatient Medications:    albuterol  (VENTOLIN  HFA) 108 (90 Base) MCG/ACT inhaler, Inhale 2 puffs into the lungs every 6 (six) hours as needed for wheezing., Disp: 6.7 g, Rfl: 2   amLODipine  (NORVASC ) 5 MG tablet, Take 1 tablet (5 mg total) by mouth daily., Disp: 90 tablet, Rfl: 1   famotidine  (PEPCID ) 20 MG tablet, One after supper, Disp: , Rfl: 0   fluticasone  (FLONASE ) 50 MCG/ACT nasal spray, Place 2 sprays into both nostrils daily., Disp: 48 g, Rfl: 3   glucose blood test strip, Use as instructed, Disp: 200 each, Rfl: 3   ipratropium (ATROVENT ) 0.06 % nasal spray, Place 2 sprays into both nostrils 4 (four) times daily., Disp: 15 mL, Rfl: 12   levocetirizine (XYZAL) 5 MG tablet, Take 5 mg by mouth every evening., Disp: , Rfl: 3    losartan  (COZAAR ) 100 MG tablet, Take 1 tablet (100 mg total) by mouth daily., Disp: 90 tablet, Rfl: 3   metFORMIN  (GLUCOPHAGE ) 1000 MG tablet, Take 1 tablet (1,000 mg total) by mouth 2 (two) times daily with a meal., Disp: 180 tablet, Rfl: 3   montelukast  (SINGULAIR ) 10 MG tablet, Take 1 tablet (10 mg total) by mouth at bedtime., Disp: 90 tablet, Rfl: 3   pantoprazole  (PROTONIX ) 40 MG tablet, Take 1 tablet (40 mg total) by mouth daily., Disp: 90 tablet, Rfl: 3   rosuvastatin  (CRESTOR ) 5 MG tablet, Take 1 tablet (5 mg total) by mouth daily., Disp: 90 tablet, Rfl: 3   albuterol  (PROVENTIL ) (2.5 MG/3ML) 0.083% nebulizer solution, Take 3 mLs (2.5 mg total) by nebulization every 6 (six) hours as needed for wheezing or shortness of breath. (Patient not  taking: Reported on 05/26/2024), Disp: 150 mL, Rfl: 1   budesonide -formoterol  (SYMBICORT ) 160-4.5 MCG/ACT inhaler, Inhale 2 puffs into the lungs in the morning and at bedtime. (Patient not taking: Reported on 05/26/2024), Disp: 3 each, Rfl: 12

## 2024-05-26 NOTE — Progress Notes (Deleted)
 "  Subjective:    Patient ID: Donna Hensley, female    DOB: [birth year], 59 y.o.   MRN: [id number]  Patient here for  Chief Complaint  Patient presents with   Medical Management of Chronic Issues    Discuss labs     HPI Scheduled follow up -  follow up regarding diabetes, GERD, hypertension and hypercholesterolemia. Saw Dr Malka 06/05/23 - chronic cough. Diagnosed - moderate persistent asthma.. recommended PFTs and increase symbicort  - 160-4.5. had f/u 01/13/24 - decreased symbicort  to 1 puff bid. Continues on protonix .    Past Medical History:  Diagnosis Date   Allergy    Asthma    GERD (gastroesophageal reflux disease)    Gestational diabetes    Hypertension    Past Surgical History:  Procedure Laterality Date   APPENDECTOMY  1986   BREAST BIOPSY Left 08/26/2020   Affirm bx-RIBBON clip path pending   CESAREAN SECTION  1996 & 2000   CHOLECYSTECTOMY  2013   COLONOSCOPY WITH PROPOFOL  N/A 05/05/2018   Procedure: COLONOSCOPY WITH PROPOFOL ;  Surgeon: Viktoria Lamar DASEN, MD;  Location: [facility] ENDOSCOPY;  Service: Endoscopy;  Laterality: N/A;   ESOPHAGOGASTRODUODENOSCOPY (EGD) WITH PROPOFOL  N/A 05/05/2018   Procedure: ESOPHAGOGASTRODUODENOSCOPY (EGD) WITH PROPOFOL ;  Surgeon: Viktoria Lamar DASEN, MD;  Location: [facility] ENDOSCOPY;  Service: Endoscopy;  Laterality: N/A;   TONSILLECTOMY  1987   wisdom tooth removal     Family History  Problem Relation Age of Onset   Hyperlipidemia Mother    Osteoporosis Mother    Hypertension Father    Arthritis Father    Sleep apnea Father    Heart disease Father    Sleep apnea Brother    Dementia Maternal Grandmother    Arthritis Paternal Grandmother    Diabetes Paternal Grandmother    Migraines Cousin    Breast cancer Neg Hx    Social History   Socioeconomic History   Marital status: Married    Spouse name: Not on file   Number of children: 2   Years of education: Not on file   Highest education level: Associate degree: occupational,  scientist, product/process development, or vocational program  Occupational History   Occupation: Therapist, Occupational: [employer]   Tobacco Use   Smoking status: Former    Current packs/day: 0.00    Average packs/day: 0.3 packs/day for 5.0 years (1.3 ttl pk-yrs)    Types: Cigarettes    Start date: 09/29/1983    Quit date: 09/28/1988    Years since quitting: 35.6   Smokeless tobacco: Never   Tobacco comments:    smoked briefly as a teenager  Vaping Use   Vaping status: Never Used  Substance and Sexual Activity   Alcohol use: Yes    Alcohol/week: 1.0 standard drink of alcohol    Types: 1 Glasses of wine per week    Comment: Occasional 1-2 per month   Drug use: Never   Sexual activity: Not on file  Other Topics Concern   Not on file  Social History Narrative   Regular exercise-yes, walk   Diet: fast food, trying to improve diet         Social Drivers of Health   Tobacco Use: Medium Risk (05/26/2024)   Patient History    Smoking Tobacco Use: Former    Smokeless Tobacco Use: Never    Passive Exposure: Not on file  Financial Resource Strain: Low Risk (01/20/2024)   Overall Financial Resource Strain (CARDIA)    Difficulty of Paying Living  Expenses: Not very hard  Food Insecurity: No Food Insecurity (01/20/2024)   Epic    Worried About Programme Researcher, Broadcasting/film/video in the Last Year: Never true    Ran Out of Food in the Last Year: Never true  Transportation Needs: No Transportation Needs (01/20/2024)   Epic    Lack of Transportation (Medical): No    Lack of Transportation (Non-Medical): No  Physical Activity: Insufficiently Active (01/20/2024)   Exercise Vital Sign    Days of Exercise per Week: 3 days    Minutes of Exercise per Session: 20 min  Stress: Stress Concern Present (01/20/2024)   [employer] of Occupational Health - Occupational Stress Questionnaire    Feeling of Stress: To some extent  Social Connections: Moderately Isolated (01/20/2024)   Social Connection and Isolation Panel    Frequency  of Communication with Friends and Family: More than three times a week    Frequency of Social Gatherings with Friends and Family: More than three times a week    Attends Religious Services: Never    Database Administrator or Organizations: No    Attends Engineer, Structural: Not on file    Marital Status: Married  Depression (PHQ2-9): Low Risk (05/26/2024)   Depression (PHQ2-9)    PHQ-2 Score: 1  Alcohol Screen: Low Risk (01/20/2024)   Alcohol Screen    Last Alcohol Screening Score (AUDIT): 2  Housing: Low Risk (01/20/2024)   Epic    Unable to Pay for Housing in the Last Year: No    Number of Times Moved in the Last Year: 0    Homeless in the Last Year: No  Utilities: Not on file  Health Literacy: Not on file     Review of Systems     Objective:     Ht 5' 2 (1.575 m)   Wt 195 lb (88.5 kg) Comment: pt reported  LMP 05/05/2017   BMI 35.67 kg/m  Wt Readings from Last 3 Encounters:  05/26/24 195 lb (88.5 kg)  01/21/24 199 lb (90.3 kg)  01/13/24 201 lb (91.2 kg)    Physical Exam  {Perform Simple Foot Exam  Perform Detailed exam:1} {Insert foot Exam (Optional):30965}   Outpatient Encounter Medications as of 05/26/2024  Medication Sig   albuterol  (VENTOLIN  HFA) 108 (90 Base) MCG/ACT inhaler Inhale 2 puffs into the lungs every 6 (six) hours as needed for wheezing.   amLODipine  (NORVASC ) 5 MG tablet TAKE 1 TABLET DAILY   budesonide -formoterol  (SYMBICORT ) 160-4.5 MCG/ACT inhaler Inhale 2 puffs into the lungs in the morning and at bedtime.   famotidine  (PEPCID ) 20 MG tablet One after supper   fluticasone  (FLONASE ) 50 MCG/ACT nasal spray Place 2 sprays into both nostrils daily.   glucose blood test strip Use as instructed   ipratropium (ATROVENT ) 0.06 % nasal spray Place 2 sprays into both nostrils 4 (four) times daily.   levocetirizine (XYZAL) 5 MG tablet Take 5 mg by mouth every evening.   losartan  (COZAAR ) 100 MG tablet TAKE 1 TABLET DAILY   metFORMIN  (GLUCOPHAGE )  1000 MG tablet TAKE 1 TABLET TWICE A DAY WITH MEALS   montelukast  (SINGULAIR ) 10 MG tablet TAKE 1 TABLET AT BEDTIME   pantoprazole  (PROTONIX ) 40 MG tablet TAKE 1 TABLET DAILY   rosuvastatin  (CRESTOR ) 5 MG tablet TAKE 1 TABLET DAILY   albuterol  (PROVENTIL ) (2.5 MG/3ML) 0.083% nebulizer solution Take 3 mLs (2.5 mg total) by nebulization every 6 (six) hours as needed for wheezing or shortness of breath. (Patient not taking:  Reported on 05/26/2024)   Fe Fum-FePoly-Vit C-Vit B3 (INTEGRA) 62.5-62.5-40-3 MG CAPS Take one capsule 2-3 days per week. (Patient not taking: Reported on 05/26/2024)   [DISCONTINUED] triamcinolone  cream (KENALOG ) 0.1 % Apply 1 Application topically 2 (two) times daily.   No facility-administered encounter medications on file as of 05/26/2024.     Lab Results  Component Value Date   WBC 7.7 07/19/2023   HGB 13.8 07/19/2023   HCT 41.6 07/19/2023   PLT 300.0 07/19/2023   GLUCOSE 113 (H) 05/21/2024   CHOL 121 05/21/2024   TRIG 91.0 05/21/2024   HDL 61.90 05/21/2024   LDLCALC 41 05/21/2024   ALT 24 05/21/2024   AST 19 05/21/2024   NA 140 05/21/2024   K 3.9 05/21/2024   CL 105 05/21/2024   CREATININE 0.90 05/21/2024   BUN 13 05/21/2024   CO2 26 05/21/2024   TSH 1.48 01/16/2024   INR 0.9 01/12/2021   HGBA1C 6.2 05/21/2024   MICROALBUR 3.4 (H) 05/21/2024    MM 3D SCREENING MAMMOGRAM BILATERAL BREAST Result Date: 05/16/2023 CLINICAL DATA:  Screening. EXAM: DIGITAL SCREENING BILATERAL MAMMOGRAM WITH TOMOSYNTHESIS AND CAD TECHNIQUE: Bilateral screening digital craniocaudal and mediolateral oblique mammograms were obtained. Bilateral screening digital breast tomosynthesis was performed. The images were evaluated with computer-aided detection. COMPARISON:  Previous exam(s). ACR Breast Density Category b: There are scattered areas of fibroglandular density. FINDINGS: There are no findings suspicious for malignancy. IMPRESSION: No mammographic evidence of malignancy. A result  letter of this screening mammogram will be mailed directly to the patient. RECOMMENDATION: Screening mammogram in one year. (Code:SM-B-01Y) BI-RADS CATEGORY  1: Negative. Electronically Signed   By: Corean Salter M.D.   On: 05/16/2023 08:50       Assessment & Plan:  There are no diagnoses linked to this encounter.   Allena Hamilton, MD "

## 2024-05-31 ENCOUNTER — Encounter: Payer: Self-pay | Admitting: Internal Medicine

## 2024-06-17 ENCOUNTER — Ambulatory Visit

## 2024-06-19 ENCOUNTER — Encounter

## 2024-08-31 ENCOUNTER — Encounter: Admitting: Internal Medicine
